# Patient Record
Sex: Female | Born: 1985 | Hispanic: No | Smoking: Current every day smoker
Health system: Southern US, Community
[De-identification: ages and names within clinical notes are randomized; demographics above are authoritative.]

## PROBLEM LIST (undated history)

## (undated) DIAGNOSIS — N390 Urinary tract infection, site not specified: Secondary | ICD-10-CM

## (undated) DIAGNOSIS — E119 Type 2 diabetes mellitus without complications: Secondary | ICD-10-CM

## (undated) DIAGNOSIS — B182 Chronic viral hepatitis C: Secondary | ICD-10-CM

## (undated) DIAGNOSIS — O139 Gestational [pregnancy-induced] hypertension without significant proteinuria, unspecified trimester: Secondary | ICD-10-CM

## (undated) DIAGNOSIS — F329 Major depressive disorder, single episode, unspecified: Secondary | ICD-10-CM

## (undated) DIAGNOSIS — N200 Calculus of kidney: Secondary | ICD-10-CM

## (undated) DIAGNOSIS — F32A Depression, unspecified: Secondary | ICD-10-CM

## (undated) DIAGNOSIS — F1910 Other psychoactive substance abuse, uncomplicated: Secondary | ICD-10-CM

## (undated) DIAGNOSIS — F4310 Post-traumatic stress disorder, unspecified: Secondary | ICD-10-CM

## (undated) DIAGNOSIS — E669 Obesity, unspecified: Secondary | ICD-10-CM

## (undated) DIAGNOSIS — F319 Bipolar disorder, unspecified: Secondary | ICD-10-CM

## (undated) DIAGNOSIS — F1410 Cocaine abuse, uncomplicated: Secondary | ICD-10-CM

## (undated) HISTORY — PX: OTHER SURGICAL HISTORY: SHX169

## (undated) HISTORY — DX: Cocaine abuse, uncomplicated: F14.10

---
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
## 2003-01-03 ENCOUNTER — Inpatient Hospital Stay (HOSPITAL_COMMUNITY): Admission: AD | Admit: 2003-01-03 | Discharge: 2003-01-03 | Payer: Self-pay | Admitting: Obstetrics & Gynecology

## 2003-01-08 ENCOUNTER — Ambulatory Visit (HOSPITAL_COMMUNITY): Admission: RE | Admit: 2003-01-08 | Discharge: 2003-01-08 | Payer: Self-pay | Admitting: Obstetrics & Gynecology

## 2003-01-08 ENCOUNTER — Encounter: Payer: Self-pay | Admitting: Obstetrics & Gynecology

## 2003-01-14 ENCOUNTER — Encounter: Payer: Self-pay | Admitting: Obstetrics & Gynecology

## 2003-01-14 ENCOUNTER — Inpatient Hospital Stay (HOSPITAL_COMMUNITY): Admission: AD | Admit: 2003-01-14 | Discharge: 2003-01-14 | Payer: Self-pay | Admitting: Obstetrics & Gynecology

## 2003-01-19 ENCOUNTER — Inpatient Hospital Stay (HOSPITAL_COMMUNITY): Admission: AD | Admit: 2003-01-19 | Discharge: 2003-01-19 | Payer: Self-pay | Admitting: Obstetrics & Gynecology

## 2003-01-24 ENCOUNTER — Inpatient Hospital Stay (HOSPITAL_COMMUNITY): Admission: AD | Admit: 2003-01-24 | Discharge: 2003-01-24 | Payer: Self-pay | Admitting: Obstetrics

## 2003-02-08 ENCOUNTER — Inpatient Hospital Stay (HOSPITAL_COMMUNITY): Admission: AD | Admit: 2003-02-08 | Discharge: 2003-02-08 | Payer: Self-pay | Admitting: Obstetrics

## 2003-03-14 ENCOUNTER — Inpatient Hospital Stay (HOSPITAL_COMMUNITY): Admission: AD | Admit: 2003-03-14 | Discharge: 2003-03-14 | Payer: Self-pay | Admitting: Obstetrics

## 2003-03-18 ENCOUNTER — Inpatient Hospital Stay (HOSPITAL_COMMUNITY): Admission: AD | Admit: 2003-03-18 | Discharge: 2003-03-18 | Payer: Self-pay | Admitting: Obstetrics

## 2003-03-20 ENCOUNTER — Inpatient Hospital Stay (HOSPITAL_COMMUNITY): Admission: AD | Admit: 2003-03-20 | Discharge: 2003-03-20 | Payer: Self-pay | Admitting: Obstetrics & Gynecology

## 2003-04-03 ENCOUNTER — Inpatient Hospital Stay (HOSPITAL_COMMUNITY): Admission: AD | Admit: 2003-04-03 | Discharge: 2003-04-03 | Payer: Self-pay | Admitting: Obstetrics

## 2003-04-08 ENCOUNTER — Inpatient Hospital Stay (HOSPITAL_COMMUNITY): Admission: AD | Admit: 2003-04-08 | Discharge: 2003-04-08 | Payer: Self-pay | Admitting: Obstetrics

## 2003-04-09 ENCOUNTER — Ambulatory Visit (HOSPITAL_COMMUNITY): Admission: RE | Admit: 2003-04-09 | Discharge: 2003-04-09 | Payer: Self-pay | Admitting: Obstetrics & Gynecology

## 2003-04-16 ENCOUNTER — Ambulatory Visit (HOSPITAL_COMMUNITY): Admission: RE | Admit: 2003-04-16 | Discharge: 2003-04-16 | Payer: Self-pay | Admitting: Obstetrics & Gynecology

## 2003-04-18 ENCOUNTER — Inpatient Hospital Stay (HOSPITAL_COMMUNITY): Admission: AD | Admit: 2003-04-18 | Discharge: 2003-04-22 | Payer: Self-pay | Admitting: Obstetrics

## 2003-04-22 ENCOUNTER — Inpatient Hospital Stay (HOSPITAL_COMMUNITY): Admission: AD | Admit: 2003-04-22 | Discharge: 2003-04-22 | Payer: Self-pay | Admitting: Obstetrics

## 2003-04-22 ENCOUNTER — Inpatient Hospital Stay (HOSPITAL_COMMUNITY): Admission: AD | Admit: 2003-04-22 | Discharge: 2003-04-26 | Payer: Self-pay | Admitting: Obstetrics & Gynecology

## 2003-05-02 ENCOUNTER — Inpatient Hospital Stay (HOSPITAL_COMMUNITY): Admission: AD | Admit: 2003-05-02 | Discharge: 2003-05-02 | Payer: Self-pay | Admitting: Obstetrics

## 2003-05-05 ENCOUNTER — Inpatient Hospital Stay (HOSPITAL_COMMUNITY): Admission: AD | Admit: 2003-05-05 | Discharge: 2003-05-05 | Payer: Self-pay | Admitting: Obstetrics & Gynecology

## 2003-05-07 ENCOUNTER — Ambulatory Visit (HOSPITAL_COMMUNITY): Admission: RE | Admit: 2003-05-07 | Discharge: 2003-05-07 | Payer: Self-pay | Admitting: Obstetrics & Gynecology

## 2003-05-15 ENCOUNTER — Inpatient Hospital Stay (HOSPITAL_COMMUNITY): Admission: AD | Admit: 2003-05-15 | Discharge: 2003-05-15 | Payer: Self-pay | Admitting: Obstetrics

## 2003-05-15 ENCOUNTER — Emergency Department (HOSPITAL_COMMUNITY): Admission: EM | Admit: 2003-05-15 | Discharge: 2003-05-15 | Payer: Self-pay | Admitting: Emergency Medicine

## 2003-05-15 ENCOUNTER — Inpatient Hospital Stay (HOSPITAL_COMMUNITY): Admission: AD | Admit: 2003-05-15 | Discharge: 2003-05-17 | Payer: Self-pay | Admitting: Obstetrics

## 2003-05-25 ENCOUNTER — Inpatient Hospital Stay (HOSPITAL_COMMUNITY): Admission: AD | Admit: 2003-05-25 | Discharge: 2003-05-28 | Payer: Self-pay | Admitting: Obstetrics & Gynecology

## 2003-05-28 ENCOUNTER — Inpatient Hospital Stay (HOSPITAL_COMMUNITY): Admission: AD | Admit: 2003-05-28 | Discharge: 2003-05-28 | Payer: Self-pay | Admitting: Obstetrics

## 2003-05-29 ENCOUNTER — Inpatient Hospital Stay (HOSPITAL_COMMUNITY): Admission: AD | Admit: 2003-05-29 | Discharge: 2003-05-29 | Payer: Self-pay | Admitting: Obstetrics

## 2003-06-07 ENCOUNTER — Inpatient Hospital Stay (HOSPITAL_COMMUNITY): Admission: RE | Admit: 2003-06-07 | Discharge: 2003-06-07 | Payer: Self-pay | Admitting: Obstetrics & Gynecology

## 2003-06-13 ENCOUNTER — Inpatient Hospital Stay (HOSPITAL_COMMUNITY): Admission: AD | Admit: 2003-06-13 | Discharge: 2003-06-14 | Payer: Self-pay | Admitting: Obstetrics

## 2003-06-19 ENCOUNTER — Inpatient Hospital Stay (HOSPITAL_COMMUNITY): Admission: AD | Admit: 2003-06-19 | Discharge: 2003-06-19 | Payer: Self-pay | Admitting: Obstetrics & Gynecology

## 2003-06-20 ENCOUNTER — Inpatient Hospital Stay (HOSPITAL_COMMUNITY): Admission: AD | Admit: 2003-06-20 | Discharge: 2003-06-20 | Payer: Self-pay | Admitting: Obstetrics & Gynecology

## 2003-06-24 ENCOUNTER — Ambulatory Visit (HOSPITAL_COMMUNITY): Admission: RE | Admit: 2003-06-24 | Discharge: 2003-06-24 | Payer: Self-pay | Admitting: Obstetrics & Gynecology

## 2003-06-25 ENCOUNTER — Inpatient Hospital Stay (HOSPITAL_COMMUNITY): Admission: AD | Admit: 2003-06-25 | Discharge: 2003-06-25 | Payer: Self-pay | Admitting: Obstetrics

## 2003-06-30 ENCOUNTER — Inpatient Hospital Stay (HOSPITAL_COMMUNITY): Admission: AD | Admit: 2003-06-30 | Discharge: 2003-06-30 | Payer: Self-pay | Admitting: Obstetrics

## 2003-07-01 ENCOUNTER — Inpatient Hospital Stay (HOSPITAL_COMMUNITY): Admission: AD | Admit: 2003-07-01 | Discharge: 2003-07-01 | Payer: Self-pay | Admitting: Obstetrics & Gynecology

## 2003-07-06 ENCOUNTER — Ambulatory Visit (HOSPITAL_COMMUNITY): Admission: RE | Admit: 2003-07-06 | Discharge: 2003-07-06 | Payer: Self-pay | Admitting: Obstetrics & Gynecology

## 2003-07-07 ENCOUNTER — Inpatient Hospital Stay (HOSPITAL_COMMUNITY): Admission: AD | Admit: 2003-07-07 | Discharge: 2003-07-09 | Payer: Self-pay | Admitting: Obstetrics

## 2003-07-14 ENCOUNTER — Inpatient Hospital Stay (HOSPITAL_COMMUNITY): Admission: AD | Admit: 2003-07-14 | Discharge: 2003-07-14 | Payer: Self-pay | Admitting: Obstetrics

## 2003-07-16 ENCOUNTER — Ambulatory Visit (HOSPITAL_COMMUNITY): Admission: RE | Admit: 2003-07-16 | Discharge: 2003-07-16 | Payer: Self-pay | Admitting: Obstetrics & Gynecology

## 2003-07-23 ENCOUNTER — Inpatient Hospital Stay (HOSPITAL_COMMUNITY): Admission: AD | Admit: 2003-07-23 | Discharge: 2003-08-08 | Payer: Self-pay | Admitting: Obstetrics

## 2003-08-09 ENCOUNTER — Inpatient Hospital Stay (HOSPITAL_COMMUNITY): Admission: AD | Admit: 2003-08-09 | Discharge: 2003-08-09 | Payer: Self-pay | Admitting: Obstetrics & Gynecology

## 2003-08-10 ENCOUNTER — Inpatient Hospital Stay (HOSPITAL_COMMUNITY): Admission: AD | Admit: 2003-08-10 | Discharge: 2003-08-10 | Payer: Self-pay | Admitting: Obstetrics

## 2003-08-10 ENCOUNTER — Inpatient Hospital Stay (HOSPITAL_COMMUNITY): Admission: AD | Admit: 2003-08-10 | Discharge: 2003-08-11 | Payer: Self-pay | Admitting: Obstetrics

## 2003-08-12 ENCOUNTER — Inpatient Hospital Stay (HOSPITAL_COMMUNITY): Admission: AD | Admit: 2003-08-12 | Discharge: 2003-08-26 | Payer: Self-pay | Admitting: Obstetrics

## 2003-08-24 ENCOUNTER — Encounter (INDEPENDENT_AMBULATORY_CARE_PROVIDER_SITE_OTHER): Payer: Self-pay | Admitting: Specialist

## 2003-11-01 ENCOUNTER — Emergency Department (HOSPITAL_COMMUNITY): Admission: EM | Admit: 2003-11-01 | Discharge: 2003-11-02 | Payer: Self-pay | Admitting: Emergency Medicine

## 2003-12-01 ENCOUNTER — Emergency Department (HOSPITAL_COMMUNITY): Admission: EM | Admit: 2003-12-01 | Discharge: 2003-12-01 | Payer: Self-pay

## 2003-12-13 ENCOUNTER — Emergency Department (HOSPITAL_COMMUNITY): Admission: EM | Admit: 2003-12-13 | Discharge: 2003-12-13 | Payer: Self-pay | Admitting: Emergency Medicine

## 2003-12-13 ENCOUNTER — Inpatient Hospital Stay (HOSPITAL_COMMUNITY): Admission: AD | Admit: 2003-12-13 | Discharge: 2003-12-13 | Payer: Self-pay | Admitting: Obstetrics & Gynecology

## 2003-12-22 ENCOUNTER — Emergency Department (HOSPITAL_COMMUNITY): Admission: EM | Admit: 2003-12-22 | Discharge: 2003-12-22 | Payer: Self-pay | Admitting: Emergency Medicine

## 2003-12-30 ENCOUNTER — Ambulatory Visit (HOSPITAL_COMMUNITY): Admission: RE | Admit: 2003-12-30 | Discharge: 2003-12-30 | Payer: Self-pay | Admitting: Obstetrics & Gynecology

## 2003-12-31 ENCOUNTER — Emergency Department (HOSPITAL_COMMUNITY): Admission: EM | Admit: 2003-12-31 | Discharge: 2003-12-31 | Payer: Self-pay | Admitting: Emergency Medicine

## 2004-01-06 ENCOUNTER — Emergency Department (HOSPITAL_COMMUNITY): Admission: EM | Admit: 2004-01-06 | Discharge: 2004-01-06 | Payer: Self-pay | Admitting: Emergency Medicine

## 2004-02-28 ENCOUNTER — Emergency Department (HOSPITAL_COMMUNITY): Admission: EM | Admit: 2004-02-28 | Discharge: 2004-02-29 | Payer: Self-pay | Admitting: Emergency Medicine

## 2004-02-29 ENCOUNTER — Emergency Department (HOSPITAL_COMMUNITY): Admission: EM | Admit: 2004-02-29 | Discharge: 2004-03-01 | Payer: Self-pay | Admitting: Emergency Medicine

## 2004-03-07 ENCOUNTER — Emergency Department (HOSPITAL_COMMUNITY): Admission: EM | Admit: 2004-03-07 | Discharge: 2004-03-07 | Payer: Self-pay | Admitting: *Deleted

## 2004-04-29 ENCOUNTER — Emergency Department (HOSPITAL_COMMUNITY): Admission: EM | Admit: 2004-04-29 | Discharge: 2004-04-29 | Payer: Self-pay | Admitting: Emergency Medicine

## 2004-05-29 ENCOUNTER — Encounter: Admission: RE | Admit: 2004-05-29 | Discharge: 2004-06-22 | Payer: Self-pay | Admitting: Sports Medicine

## 2004-06-21 ENCOUNTER — Emergency Department (HOSPITAL_COMMUNITY): Admission: EM | Admit: 2004-06-21 | Discharge: 2004-06-21 | Payer: Self-pay | Admitting: Emergency Medicine

## 2004-06-22 ENCOUNTER — Emergency Department (HOSPITAL_COMMUNITY): Admission: EM | Admit: 2004-06-22 | Discharge: 2004-06-22 | Payer: Self-pay | Admitting: Emergency Medicine

## 2004-07-26 ENCOUNTER — Emergency Department (HOSPITAL_COMMUNITY): Admission: EM | Admit: 2004-07-26 | Discharge: 2004-07-26 | Payer: Self-pay | Admitting: Emergency Medicine

## 2004-07-28 ENCOUNTER — Ambulatory Visit (HOSPITAL_COMMUNITY): Admission: RE | Admit: 2004-07-28 | Discharge: 2004-07-28 | Payer: Self-pay | Admitting: Obstetrics & Gynecology

## 2004-08-03 ENCOUNTER — Emergency Department (HOSPITAL_COMMUNITY): Admission: EM | Admit: 2004-08-03 | Discharge: 2004-08-03 | Payer: Self-pay | Admitting: *Deleted

## 2004-08-08 ENCOUNTER — Inpatient Hospital Stay (HOSPITAL_COMMUNITY): Admission: AD | Admit: 2004-08-08 | Discharge: 2004-08-08 | Payer: Self-pay | Admitting: Obstetrics

## 2004-10-20 ENCOUNTER — Inpatient Hospital Stay (HOSPITAL_COMMUNITY): Admission: AD | Admit: 2004-10-20 | Discharge: 2004-10-20 | Payer: Self-pay | Admitting: Obstetrics & Gynecology

## 2004-10-27 ENCOUNTER — Emergency Department (HOSPITAL_COMMUNITY): Admission: EM | Admit: 2004-10-27 | Discharge: 2004-10-27 | Payer: Self-pay | Admitting: Emergency Medicine

## 2004-11-03 ENCOUNTER — Emergency Department (HOSPITAL_COMMUNITY): Admission: EM | Admit: 2004-11-03 | Discharge: 2004-11-03 | Payer: Self-pay | Admitting: Emergency Medicine

## 2004-12-26 ENCOUNTER — Inpatient Hospital Stay (HOSPITAL_COMMUNITY): Admission: AD | Admit: 2004-12-26 | Discharge: 2004-12-26 | Payer: Self-pay | Admitting: Obstetrics

## 2005-01-01 ENCOUNTER — Emergency Department (HOSPITAL_COMMUNITY): Admission: EM | Admit: 2005-01-01 | Discharge: 2005-01-01 | Payer: Self-pay | Admitting: Family Medicine

## 2005-01-01 ENCOUNTER — Emergency Department (HOSPITAL_COMMUNITY): Admission: EM | Admit: 2005-01-01 | Discharge: 2005-01-01 | Payer: Self-pay | Admitting: Emergency Medicine

## 2005-01-17 ENCOUNTER — Inpatient Hospital Stay (HOSPITAL_COMMUNITY): Admission: AD | Admit: 2005-01-17 | Discharge: 2005-01-17 | Payer: Self-pay | Admitting: Obstetrics & Gynecology

## 2005-03-20 ENCOUNTER — Inpatient Hospital Stay (HOSPITAL_COMMUNITY): Admission: AD | Admit: 2005-03-20 | Discharge: 2005-03-20 | Payer: Self-pay | Admitting: Obstetrics & Gynecology

## 2005-08-07 ENCOUNTER — Emergency Department (HOSPITAL_COMMUNITY): Admission: EM | Admit: 2005-08-07 | Discharge: 2005-08-08 | Payer: Self-pay | Admitting: Emergency Medicine

## 2005-10-25 ENCOUNTER — Inpatient Hospital Stay (HOSPITAL_COMMUNITY): Admission: AD | Admit: 2005-10-25 | Discharge: 2005-10-25 | Payer: Self-pay | Admitting: Obstetrics & Gynecology

## 2005-10-28 ENCOUNTER — Emergency Department (HOSPITAL_COMMUNITY): Admission: EM | Admit: 2005-10-28 | Discharge: 2005-10-28 | Payer: Self-pay | Admitting: Emergency Medicine

## 2005-11-16 ENCOUNTER — Emergency Department (HOSPITAL_COMMUNITY): Admission: EM | Admit: 2005-11-16 | Discharge: 2005-11-16 | Payer: Self-pay | Admitting: Emergency Medicine

## 2005-11-17 ENCOUNTER — Emergency Department (HOSPITAL_COMMUNITY): Admission: EM | Admit: 2005-11-17 | Discharge: 2005-11-18 | Payer: Self-pay | Admitting: Emergency Medicine

## 2005-12-10 ENCOUNTER — Emergency Department (HOSPITAL_COMMUNITY): Admission: EM | Admit: 2005-12-10 | Discharge: 2005-12-10 | Payer: Self-pay | Admitting: Family Medicine

## 2006-01-25 ENCOUNTER — Inpatient Hospital Stay (HOSPITAL_COMMUNITY): Admission: AD | Admit: 2006-01-25 | Discharge: 2006-01-25 | Payer: Self-pay | Admitting: Obstetrics

## 2006-05-07 ENCOUNTER — Emergency Department (HOSPITAL_COMMUNITY): Admission: EM | Admit: 2006-05-07 | Discharge: 2006-05-08 | Payer: Self-pay | Admitting: Emergency Medicine

## 2006-05-16 ENCOUNTER — Emergency Department (HOSPITAL_COMMUNITY): Admission: EM | Admit: 2006-05-16 | Discharge: 2006-05-16 | Payer: Self-pay | Admitting: Emergency Medicine

## 2006-06-16 ENCOUNTER — Emergency Department (HOSPITAL_COMMUNITY): Admission: EM | Admit: 2006-06-16 | Discharge: 2006-06-16 | Payer: Self-pay | Admitting: Emergency Medicine

## 2006-09-14 ENCOUNTER — Inpatient Hospital Stay (HOSPITAL_COMMUNITY): Admission: AD | Admit: 2006-09-14 | Discharge: 2006-09-14 | Payer: Self-pay | Admitting: Obstetrics

## 2006-09-22 ENCOUNTER — Emergency Department (HOSPITAL_COMMUNITY): Admission: EM | Admit: 2006-09-22 | Discharge: 2006-09-22 | Payer: Self-pay | Admitting: Emergency Medicine

## 2006-10-14 ENCOUNTER — Ambulatory Visit: Payer: Self-pay | Admitting: Psychiatry

## 2006-10-14 ENCOUNTER — Inpatient Hospital Stay (HOSPITAL_COMMUNITY): Admission: AD | Admit: 2006-10-14 | Discharge: 2006-10-22 | Payer: Self-pay | Admitting: Psychiatry

## 2006-10-27 ENCOUNTER — Encounter: Payer: Self-pay | Admitting: Emergency Medicine

## 2006-10-27 ENCOUNTER — Inpatient Hospital Stay (HOSPITAL_COMMUNITY): Admission: AD | Admit: 2006-10-27 | Discharge: 2006-11-04 | Payer: Self-pay | Admitting: Psychiatry

## 2006-11-25 ENCOUNTER — Emergency Department (HOSPITAL_COMMUNITY): Admission: EM | Admit: 2006-11-25 | Discharge: 2006-11-26 | Payer: Self-pay | Admitting: Emergency Medicine

## 2006-12-28 ENCOUNTER — Emergency Department (HOSPITAL_COMMUNITY): Admission: EM | Admit: 2006-12-28 | Discharge: 2006-12-29 | Payer: Self-pay | Admitting: Emergency Medicine

## 2006-12-29 ENCOUNTER — Inpatient Hospital Stay (HOSPITAL_COMMUNITY): Admission: EM | Admit: 2006-12-29 | Discharge: 2006-12-31 | Payer: Self-pay | Admitting: Emergency Medicine

## 2006-12-31 ENCOUNTER — Inpatient Hospital Stay (HOSPITAL_COMMUNITY): Admission: AD | Admit: 2006-12-31 | Discharge: 2007-01-02 | Payer: Self-pay | Admitting: *Deleted

## 2006-12-31 ENCOUNTER — Ambulatory Visit: Payer: Self-pay | Admitting: *Deleted

## 2007-01-08 ENCOUNTER — Emergency Department (HOSPITAL_COMMUNITY): Admission: EM | Admit: 2007-01-08 | Discharge: 2007-01-08 | Payer: Self-pay | Admitting: Emergency Medicine

## 2007-01-20 ENCOUNTER — Emergency Department (HOSPITAL_COMMUNITY): Admission: EM | Admit: 2007-01-20 | Discharge: 2007-01-20 | Payer: Self-pay | Admitting: Emergency Medicine

## 2007-01-25 ENCOUNTER — Emergency Department (HOSPITAL_COMMUNITY): Admission: EM | Admit: 2007-01-25 | Discharge: 2007-01-26 | Payer: Self-pay | Admitting: Emergency Medicine

## 2007-02-02 ENCOUNTER — Emergency Department (HOSPITAL_COMMUNITY): Admission: EM | Admit: 2007-02-02 | Discharge: 2007-02-02 | Payer: Self-pay | Admitting: *Deleted

## 2007-02-06 ENCOUNTER — Inpatient Hospital Stay (HOSPITAL_COMMUNITY): Admission: AD | Admit: 2007-02-06 | Discharge: 2007-02-07 | Payer: Self-pay | Admitting: Obstetrics

## 2007-02-09 ENCOUNTER — Inpatient Hospital Stay (HOSPITAL_COMMUNITY): Admission: AD | Admit: 2007-02-09 | Discharge: 2007-02-09 | Payer: Self-pay | Admitting: Obstetrics & Gynecology

## 2007-02-28 ENCOUNTER — Inpatient Hospital Stay (HOSPITAL_COMMUNITY): Admission: AD | Admit: 2007-02-28 | Discharge: 2007-03-04 | Payer: Self-pay | Admitting: Obstetrics

## 2007-04-09 ENCOUNTER — Inpatient Hospital Stay (HOSPITAL_COMMUNITY): Admission: AD | Admit: 2007-04-09 | Discharge: 2007-04-09 | Payer: Self-pay | Admitting: Obstetrics

## 2007-04-12 ENCOUNTER — Inpatient Hospital Stay (HOSPITAL_COMMUNITY): Admission: AD | Admit: 2007-04-12 | Discharge: 2007-04-14 | Payer: Self-pay | Admitting: Obstetrics & Gynecology

## 2007-04-24 ENCOUNTER — Observation Stay (HOSPITAL_COMMUNITY): Admission: AD | Admit: 2007-04-24 | Discharge: 2007-04-25 | Payer: Self-pay | Admitting: Obstetrics

## 2007-05-10 ENCOUNTER — Inpatient Hospital Stay (HOSPITAL_COMMUNITY): Admission: AD | Admit: 2007-05-10 | Discharge: 2007-05-16 | Payer: Self-pay | Admitting: Obstetrics

## 2007-05-17 ENCOUNTER — Inpatient Hospital Stay (HOSPITAL_COMMUNITY): Admission: AD | Admit: 2007-05-17 | Discharge: 2007-05-17 | Payer: Self-pay | Admitting: Obstetrics & Gynecology

## 2007-05-22 ENCOUNTER — Inpatient Hospital Stay (HOSPITAL_COMMUNITY): Admission: AD | Admit: 2007-05-22 | Discharge: 2007-05-23 | Payer: Self-pay | Admitting: Obstetrics

## 2007-05-26 ENCOUNTER — Inpatient Hospital Stay (HOSPITAL_COMMUNITY): Admission: AD | Admit: 2007-05-26 | Discharge: 2007-05-26 | Payer: Self-pay | Admitting: Obstetrics & Gynecology

## 2007-06-13 ENCOUNTER — Inpatient Hospital Stay (HOSPITAL_COMMUNITY): Admission: AD | Admit: 2007-06-13 | Discharge: 2007-06-13 | Payer: Self-pay | Admitting: Obstetrics & Gynecology

## 2007-07-06 ENCOUNTER — Inpatient Hospital Stay (HOSPITAL_COMMUNITY): Admission: AD | Admit: 2007-07-06 | Discharge: 2007-07-11 | Payer: Self-pay | Admitting: Obstetrics & Gynecology

## 2007-07-12 ENCOUNTER — Inpatient Hospital Stay (HOSPITAL_COMMUNITY): Admission: AD | Admit: 2007-07-12 | Discharge: 2007-07-12 | Payer: Self-pay | Admitting: Obstetrics

## 2007-07-24 ENCOUNTER — Inpatient Hospital Stay (HOSPITAL_COMMUNITY): Admission: AD | Admit: 2007-07-24 | Discharge: 2007-07-25 | Payer: Self-pay | Admitting: Obstetrics & Gynecology

## 2007-08-07 ENCOUNTER — Ambulatory Visit (HOSPITAL_COMMUNITY): Admission: RE | Admit: 2007-08-07 | Discharge: 2007-08-07 | Payer: Self-pay | Admitting: Obstetrics & Gynecology

## 2007-08-09 ENCOUNTER — Inpatient Hospital Stay (HOSPITAL_COMMUNITY): Admission: AD | Admit: 2007-08-09 | Discharge: 2007-08-09 | Payer: Self-pay | Admitting: Obstetrics

## 2007-08-14 ENCOUNTER — Ambulatory Visit (HOSPITAL_COMMUNITY): Admission: RE | Admit: 2007-08-14 | Discharge: 2007-08-14 | Payer: Self-pay | Admitting: Obstetrics

## 2007-08-18 ENCOUNTER — Inpatient Hospital Stay (HOSPITAL_COMMUNITY): Admission: AD | Admit: 2007-08-18 | Discharge: 2007-08-18 | Payer: Self-pay | Admitting: Obstetrics & Gynecology

## 2007-08-19 ENCOUNTER — Inpatient Hospital Stay (HOSPITAL_COMMUNITY): Admission: AD | Admit: 2007-08-19 | Discharge: 2007-08-19 | Payer: Self-pay | Admitting: Obstetrics & Gynecology

## 2007-08-25 ENCOUNTER — Ambulatory Visit (HOSPITAL_COMMUNITY): Admission: AD | Admit: 2007-08-25 | Discharge: 2007-08-25 | Payer: Self-pay | Admitting: Obstetrics

## 2007-09-01 ENCOUNTER — Inpatient Hospital Stay (HOSPITAL_COMMUNITY): Admission: AD | Admit: 2007-09-01 | Discharge: 2007-09-05 | Payer: Self-pay | Admitting: Obstetrics & Gynecology

## 2007-09-17 ENCOUNTER — Emergency Department (HOSPITAL_COMMUNITY): Admission: EM | Admit: 2007-09-17 | Discharge: 2007-09-17 | Payer: Self-pay | Admitting: Emergency Medicine

## 2007-10-27 ENCOUNTER — Emergency Department (HOSPITAL_COMMUNITY): Admission: EM | Admit: 2007-10-27 | Discharge: 2007-10-27 | Payer: Self-pay | Admitting: Emergency Medicine

## 2008-02-01 ENCOUNTER — Inpatient Hospital Stay (HOSPITAL_COMMUNITY): Admission: AD | Admit: 2008-02-01 | Discharge: 2008-02-01 | Payer: Self-pay | Admitting: Obstetrics & Gynecology

## 2008-05-06 ENCOUNTER — Emergency Department (HOSPITAL_COMMUNITY): Admission: EM | Admit: 2008-05-06 | Discharge: 2008-05-06 | Payer: Self-pay | Admitting: Emergency Medicine

## 2008-05-23 ENCOUNTER — Emergency Department (HOSPITAL_COMMUNITY): Admission: EM | Admit: 2008-05-23 | Discharge: 2008-05-24 | Payer: Self-pay | Admitting: Emergency Medicine

## 2008-06-07 ENCOUNTER — Other Ambulatory Visit: Payer: Self-pay

## 2008-06-07 ENCOUNTER — Inpatient Hospital Stay (HOSPITAL_COMMUNITY): Admission: RE | Admit: 2008-06-07 | Discharge: 2008-06-09 | Payer: Self-pay | Admitting: *Deleted

## 2008-06-07 ENCOUNTER — Ambulatory Visit: Payer: Self-pay | Admitting: *Deleted

## 2008-07-31 ENCOUNTER — Emergency Department (HOSPITAL_COMMUNITY): Admission: EM | Admit: 2008-07-31 | Discharge: 2008-07-31 | Payer: Self-pay | Admitting: Emergency Medicine

## 2008-09-07 ENCOUNTER — Ambulatory Visit (HOSPITAL_COMMUNITY): Admission: RE | Admit: 2008-09-07 | Discharge: 2008-09-07 | Payer: Self-pay | Admitting: Obstetrics & Gynecology

## 2008-10-14 ENCOUNTER — Inpatient Hospital Stay (HOSPITAL_COMMUNITY): Admission: AD | Admit: 2008-10-14 | Discharge: 2008-10-16 | Payer: Self-pay | Admitting: Psychiatry

## 2008-10-14 ENCOUNTER — Ambulatory Visit: Payer: Self-pay | Admitting: Psychiatry

## 2008-10-14 ENCOUNTER — Encounter: Payer: Self-pay | Admitting: Emergency Medicine

## 2008-10-19 ENCOUNTER — Emergency Department (HOSPITAL_COMMUNITY): Admission: EM | Admit: 2008-10-19 | Discharge: 2008-10-19 | Payer: Self-pay | Admitting: Emergency Medicine

## 2008-10-27 ENCOUNTER — Inpatient Hospital Stay (HOSPITAL_COMMUNITY): Admission: AD | Admit: 2008-10-27 | Discharge: 2008-10-27 | Payer: Self-pay | Admitting: Obstetrics

## 2008-10-28 ENCOUNTER — Ambulatory Visit (HOSPITAL_COMMUNITY): Admission: RE | Admit: 2008-10-28 | Discharge: 2008-10-28 | Payer: Self-pay | Admitting: Obstetrics & Gynecology

## 2008-11-10 ENCOUNTER — Emergency Department (HOSPITAL_COMMUNITY): Admission: EM | Admit: 2008-11-10 | Discharge: 2008-11-10 | Payer: Self-pay | Admitting: Emergency Medicine

## 2008-11-23 ENCOUNTER — Inpatient Hospital Stay (HOSPITAL_COMMUNITY): Admission: AD | Admit: 2008-11-23 | Discharge: 2008-11-26 | Payer: Self-pay | Admitting: Psychiatry

## 2008-11-23 ENCOUNTER — Ambulatory Visit: Payer: Self-pay | Admitting: Psychiatry

## 2008-11-28 ENCOUNTER — Emergency Department (HOSPITAL_COMMUNITY): Admission: EM | Admit: 2008-11-28 | Discharge: 2008-11-28 | Payer: Self-pay | Admitting: Emergency Medicine

## 2008-12-03 ENCOUNTER — Other Ambulatory Visit: Payer: Self-pay | Admitting: Emergency Medicine

## 2008-12-04 ENCOUNTER — Encounter (INDEPENDENT_AMBULATORY_CARE_PROVIDER_SITE_OTHER): Payer: Self-pay | Admitting: Emergency Medicine

## 2008-12-04 ENCOUNTER — Inpatient Hospital Stay (HOSPITAL_COMMUNITY): Admission: AD | Admit: 2008-12-04 | Discharge: 2008-12-05 | Payer: Self-pay | Admitting: Psychiatry

## 2008-12-04 ENCOUNTER — Ambulatory Visit: Payer: Self-pay | Admitting: *Deleted

## 2008-12-25 ENCOUNTER — Other Ambulatory Visit: Payer: Self-pay | Admitting: Emergency Medicine

## 2008-12-26 ENCOUNTER — Inpatient Hospital Stay (HOSPITAL_COMMUNITY): Admission: AD | Admit: 2008-12-26 | Discharge: 2008-12-30 | Payer: Self-pay | Admitting: *Deleted

## 2008-12-26 ENCOUNTER — Ambulatory Visit: Payer: Self-pay | Admitting: *Deleted

## 2008-12-26 ENCOUNTER — Ambulatory Visit: Payer: Self-pay | Admitting: Psychiatry

## 2008-12-28 ENCOUNTER — Encounter: Payer: Self-pay | Admitting: Emergency Medicine

## 2009-01-02 ENCOUNTER — Encounter: Payer: Self-pay | Admitting: Emergency Medicine

## 2009-01-03 ENCOUNTER — Inpatient Hospital Stay (HOSPITAL_COMMUNITY): Admission: RE | Admit: 2009-01-03 | Discharge: 2009-01-04 | Payer: Self-pay | Admitting: Psychiatry

## 2009-01-07 ENCOUNTER — Inpatient Hospital Stay (HOSPITAL_COMMUNITY): Admission: AD | Admit: 2009-01-07 | Discharge: 2009-01-07 | Payer: Self-pay | Admitting: Obstetrics

## 2009-01-14 ENCOUNTER — Other Ambulatory Visit: Payer: Self-pay | Admitting: Emergency Medicine

## 2009-01-15 ENCOUNTER — Inpatient Hospital Stay (HOSPITAL_COMMUNITY): Admission: EM | Admit: 2009-01-15 | Discharge: 2009-01-16 | Payer: Self-pay | Admitting: *Deleted

## 2009-01-30 ENCOUNTER — Emergency Department (HOSPITAL_COMMUNITY): Admission: EM | Admit: 2009-01-30 | Discharge: 2009-01-30 | Payer: Self-pay | Admitting: Emergency Medicine

## 2009-02-21 ENCOUNTER — Emergency Department (HOSPITAL_COMMUNITY): Admission: EM | Admit: 2009-02-21 | Discharge: 2009-02-21 | Payer: Self-pay | Admitting: Emergency Medicine

## 2009-03-15 ENCOUNTER — Emergency Department (HOSPITAL_COMMUNITY): Admission: EM | Admit: 2009-03-15 | Discharge: 2009-03-15 | Payer: Self-pay | Admitting: Emergency Medicine

## 2009-03-21 ENCOUNTER — Inpatient Hospital Stay (HOSPITAL_COMMUNITY): Admission: AD | Admit: 2009-03-21 | Discharge: 2009-03-21 | Payer: Self-pay | Admitting: Obstetrics

## 2009-03-26 ENCOUNTER — Emergency Department (HOSPITAL_COMMUNITY): Admission: EM | Admit: 2009-03-26 | Discharge: 2009-03-26 | Payer: Self-pay | Admitting: Emergency Medicine

## 2009-03-29 ENCOUNTER — Other Ambulatory Visit: Payer: Self-pay

## 2009-03-29 ENCOUNTER — Other Ambulatory Visit: Payer: Self-pay | Admitting: Emergency Medicine

## 2009-03-30 ENCOUNTER — Inpatient Hospital Stay (HOSPITAL_COMMUNITY): Admission: AD | Admit: 2009-03-30 | Discharge: 2009-03-31 | Payer: Self-pay | Admitting: Psychiatry

## 2009-03-30 ENCOUNTER — Ambulatory Visit: Payer: Self-pay | Admitting: Psychiatry

## 2009-05-02 ENCOUNTER — Emergency Department (HOSPITAL_COMMUNITY): Admission: EM | Admit: 2009-05-02 | Discharge: 2009-05-02 | Payer: Self-pay | Admitting: Emergency Medicine

## 2009-05-16 ENCOUNTER — Inpatient Hospital Stay (HOSPITAL_COMMUNITY): Admission: AD | Admit: 2009-05-16 | Discharge: 2009-05-16 | Payer: Self-pay | Admitting: Obstetrics & Gynecology

## 2009-07-24 ENCOUNTER — Emergency Department (HOSPITAL_COMMUNITY): Admission: EM | Admit: 2009-07-24 | Discharge: 2009-07-24 | Payer: Self-pay | Admitting: Emergency Medicine

## 2009-07-25 ENCOUNTER — Inpatient Hospital Stay (HOSPITAL_COMMUNITY): Admission: AD | Admit: 2009-07-25 | Discharge: 2009-07-25 | Payer: Self-pay | Admitting: Obstetrics & Gynecology

## 2009-08-02 ENCOUNTER — Emergency Department (HOSPITAL_COMMUNITY): Admission: EM | Admit: 2009-08-02 | Discharge: 2009-08-02 | Payer: Self-pay | Admitting: Emergency Medicine

## 2009-10-18 ENCOUNTER — Emergency Department (HOSPITAL_COMMUNITY): Admission: EM | Admit: 2009-10-18 | Discharge: 2009-10-18 | Payer: Self-pay | Admitting: Emergency Medicine

## 2009-12-23 ENCOUNTER — Emergency Department (HOSPITAL_COMMUNITY): Admission: EM | Admit: 2009-12-23 | Discharge: 2009-12-23 | Payer: Self-pay | Admitting: Emergency Medicine

## 2009-12-24 ENCOUNTER — Emergency Department (HOSPITAL_COMMUNITY): Admission: EM | Admit: 2009-12-24 | Discharge: 2009-12-24 | Payer: Self-pay | Admitting: Emergency Medicine

## 2010-01-11 ENCOUNTER — Inpatient Hospital Stay (HOSPITAL_COMMUNITY): Admission: AD | Admit: 2010-01-11 | Discharge: 2010-01-11 | Payer: Self-pay | Admitting: Obstetrics & Gynecology

## 2010-02-27 ENCOUNTER — Emergency Department (HOSPITAL_COMMUNITY): Admission: EM | Admit: 2010-02-27 | Discharge: 2010-02-28 | Payer: Self-pay | Admitting: Emergency Medicine

## 2010-05-04 ENCOUNTER — Inpatient Hospital Stay (HOSPITAL_COMMUNITY): Admission: AD | Admit: 2010-05-04 | Discharge: 2010-05-04 | Payer: Self-pay | Admitting: Obstetrics

## 2010-06-20 ENCOUNTER — Inpatient Hospital Stay (HOSPITAL_COMMUNITY)
Admission: AD | Admit: 2010-06-20 | Discharge: 2010-06-20 | Payer: Self-pay | Source: Home / Self Care | Attending: Obstetrics | Admitting: Obstetrics

## 2010-06-26 LAB — WET PREP, GENITAL: Yeast Wet Prep HPF POC: NONE SEEN

## 2010-06-26 LAB — URINALYSIS, ROUTINE W REFLEX MICROSCOPIC
Bilirubin Urine: NEGATIVE
Hgb urine dipstick: NEGATIVE
Ketones, ur: NEGATIVE mg/dL
Nitrite: POSITIVE — AB
Protein, ur: NEGATIVE mg/dL
Specific Gravity, Urine: 1.025 (ref 1.005–1.030)
Urine Glucose, Fasting: NEGATIVE mg/dL
Urobilinogen, UA: 0.2 mg/dL (ref 0.0–1.0)
pH: 6 (ref 5.0–8.0)

## 2010-06-26 LAB — DIFFERENTIAL
Basophils Absolute: 0 10*3/uL (ref 0.0–0.1)
Basophils Relative: 0 % (ref 0–1)
Eosinophils Absolute: 0.1 10*3/uL (ref 0.0–0.7)
Eosinophils Relative: 1 % (ref 0–5)
Lymphocytes Relative: 26 % (ref 12–46)
Lymphs Abs: 2.6 10*3/uL (ref 0.7–4.0)
Monocytes Absolute: 0.6 10*3/uL (ref 0.1–1.0)
Monocytes Relative: 6 % (ref 3–12)
Neutro Abs: 6.7 10*3/uL (ref 1.7–7.7)
Neutrophils Relative %: 67 % (ref 43–77)

## 2010-06-26 LAB — COMPREHENSIVE METABOLIC PANEL
ALT: 18 U/L (ref 0–35)
AST: 13 U/L (ref 0–37)
Albumin: 3.3 g/dL — ABNORMAL LOW (ref 3.5–5.2)
Alkaline Phosphatase: 68 U/L (ref 39–117)
BUN: 8 mg/dL (ref 6–23)
CO2: 27 mEq/L (ref 19–32)
Calcium: 8.9 mg/dL (ref 8.4–10.5)
Chloride: 104 mEq/L (ref 96–112)
Creatinine, Ser: 0.75 mg/dL (ref 0.4–1.2)
GFR calc Af Amer: >60 mL/min (ref >60)
GFR calc non Af Amer: >60 mL/min (ref >60)
Glucose, Bld: 83 mg/dL (ref 70–99)
Potassium: 3.8 mEq/L (ref 3.5–5.1)
Sodium: 138 mEq/L (ref 135–145)
Total Bilirubin: 0.3 mg/dL (ref 0.3–1.2)
Total Protein: 7.5 g/dL (ref 6.0–8.3)

## 2010-06-26 LAB — CBC
HCT: 34.4 % — ABNORMAL LOW (ref 36.0–46.0)
Hemoglobin: 10.3 g/dL — ABNORMAL LOW (ref 12.0–15.0)
MCH: 22.1 pg — ABNORMAL LOW (ref 26.0–34.0)
MCHC: 29.9 g/dL — ABNORMAL LOW (ref 30.0–36.0)
MCV: 73.7 fL — ABNORMAL LOW (ref 78.0–100.0)
Platelets: 296 10*3/uL (ref 150–400)
RBC: 4.67 MIL/uL (ref 3.87–5.11)
RDW: 17.4 % — ABNORMAL HIGH (ref 11.5–15.5)
WBC: 9.9 10*3/uL (ref 4.0–10.5)

## 2010-06-26 LAB — GC/CHLAMYDIA PROBE AMP, GENITAL
Chlamydia, DNA Probe: POSITIVE — AB
GC Probe Amp, Genital: NEGATIVE

## 2010-06-26 LAB — POCT PREGNANCY, URINE: Preg Test, Ur: NEGATIVE

## 2010-06-26 LAB — URINE MICROSCOPIC-ADD ON

## 2010-07-01 ENCOUNTER — Encounter: Payer: Self-pay | Admitting: Obstetrics & Gynecology

## 2010-07-02 ENCOUNTER — Encounter: Payer: Self-pay | Admitting: Obstetrics & Gynecology

## 2010-07-02 ENCOUNTER — Encounter: Payer: Self-pay | Admitting: Obstetrics

## 2010-07-03 ENCOUNTER — Encounter: Payer: Self-pay | Admitting: Obstetrics & Gynecology

## 2010-07-10 ENCOUNTER — Inpatient Hospital Stay (HOSPITAL_COMMUNITY)
Admission: AD | Admit: 2010-07-10 | Discharge: 2010-07-10 | Payer: Self-pay | Source: Home / Self Care | Attending: Obstetrics | Admitting: Obstetrics

## 2010-07-10 LAB — URINALYSIS, ROUTINE W REFLEX MICROSCOPIC
Bilirubin Urine: NEGATIVE
Hgb urine dipstick: NEGATIVE
Protein, ur: NEGATIVE mg/dL
Urobilinogen, UA: 0.2 mg/dL (ref 0.0–1.0)

## 2010-08-13 ENCOUNTER — Emergency Department (HOSPITAL_COMMUNITY): Payer: Medicaid Other

## 2010-08-13 ENCOUNTER — Emergency Department (HOSPITAL_COMMUNITY)
Admission: EM | Admit: 2010-08-13 | Discharge: 2010-08-13 | Disposition: A | Discharge status: Home / Self Care | Payer: Medicaid Other | Attending: Emergency Medicine | Admitting: Emergency Medicine

## 2010-08-13 DIAGNOSIS — IMO0002 Reserved for concepts with insufficient information to code with codable children: Secondary | ICD-10-CM | POA: Insufficient documentation

## 2010-08-13 DIAGNOSIS — Y92009 Unspecified place in unspecified non-institutional (private) residence as the place of occurrence of the external cause: Secondary | ICD-10-CM | POA: Insufficient documentation

## 2010-08-13 DIAGNOSIS — Z79899 Other long term (current) drug therapy: Secondary | ICD-10-CM | POA: Insufficient documentation

## 2010-08-13 DIAGNOSIS — S8990XA Unspecified injury of unspecified lower leg, initial encounter: Secondary | ICD-10-CM | POA: Insufficient documentation

## 2010-08-13 DIAGNOSIS — M549 Dorsalgia, unspecified: Secondary | ICD-10-CM | POA: Insufficient documentation

## 2010-08-13 DIAGNOSIS — G8929 Other chronic pain: Secondary | ICD-10-CM | POA: Insufficient documentation

## 2010-08-13 DIAGNOSIS — X500XXA Overexertion from strenuous movement or load, initial encounter: Secondary | ICD-10-CM | POA: Insufficient documentation

## 2010-08-13 DIAGNOSIS — F3130 Bipolar disorder, current episode depressed, mild or moderate severity, unspecified: Secondary | ICD-10-CM | POA: Insufficient documentation

## 2010-08-13 DIAGNOSIS — M542 Cervicalgia: Secondary | ICD-10-CM | POA: Insufficient documentation

## 2010-08-13 DIAGNOSIS — M25569 Pain in unspecified knee: Secondary | ICD-10-CM | POA: Insufficient documentation

## 2010-08-16 ENCOUNTER — Other Ambulatory Visit: Payer: Self-pay | Admitting: Specialist

## 2010-08-16 ENCOUNTER — Ambulatory Visit
Admission: RE | Admit: 2010-08-16 | Discharge: 2010-08-16 | Disposition: A | Discharge status: Home / Self Care | Payer: Medicaid Other | Source: Ambulatory Visit | Attending: Specialist | Admitting: Specialist

## 2010-08-16 DIAGNOSIS — M549 Dorsalgia, unspecified: Secondary | ICD-10-CM

## 2010-08-17 ENCOUNTER — Emergency Department (HOSPITAL_COMMUNITY)
Admission: EM | Admit: 2010-08-17 | Discharge: 2010-08-17 | Discharge status: Against Medical Advice | Payer: Medicaid Other | Attending: Emergency Medicine | Admitting: Emergency Medicine

## 2010-08-17 ENCOUNTER — Emergency Department (HOSPITAL_COMMUNITY): Payer: Medicaid Other

## 2010-08-17 DIAGNOSIS — F4310 Post-traumatic stress disorder, unspecified: Secondary | ICD-10-CM | POA: Insufficient documentation

## 2010-08-17 DIAGNOSIS — F319 Bipolar disorder, unspecified: Secondary | ICD-10-CM | POA: Insufficient documentation

## 2010-08-17 DIAGNOSIS — M25519 Pain in unspecified shoulder: Secondary | ICD-10-CM | POA: Insufficient documentation

## 2010-08-17 DIAGNOSIS — Y92009 Unspecified place in unspecified non-institutional (private) residence as the place of occurrence of the external cause: Secondary | ICD-10-CM | POA: Insufficient documentation

## 2010-08-20 ENCOUNTER — Emergency Department (HOSPITAL_COMMUNITY)
Admission: EM | Admit: 2010-08-20 | Discharge: 2010-08-20 | Disposition: A | Discharge status: Home / Self Care | Payer: Medicaid Other | Attending: Emergency Medicine | Admitting: Emergency Medicine

## 2010-08-20 DIAGNOSIS — M542 Cervicalgia: Secondary | ICD-10-CM | POA: Insufficient documentation

## 2010-08-20 DIAGNOSIS — W19XXXA Unspecified fall, initial encounter: Secondary | ICD-10-CM | POA: Insufficient documentation

## 2010-08-20 DIAGNOSIS — R209 Unspecified disturbances of skin sensation: Secondary | ICD-10-CM | POA: Insufficient documentation

## 2010-08-20 DIAGNOSIS — S139XXA Sprain of joints and ligaments of unspecified parts of neck, initial encounter: Secondary | ICD-10-CM | POA: Insufficient documentation

## 2010-08-20 DIAGNOSIS — Y929 Unspecified place or not applicable: Secondary | ICD-10-CM | POA: Insufficient documentation

## 2010-08-20 DIAGNOSIS — M546 Pain in thoracic spine: Secondary | ICD-10-CM | POA: Insufficient documentation

## 2010-08-20 DIAGNOSIS — M79609 Pain in unspecified limb: Secondary | ICD-10-CM | POA: Insufficient documentation

## 2010-08-20 DIAGNOSIS — F3130 Bipolar disorder, current episode depressed, mild or moderate severity, unspecified: Secondary | ICD-10-CM | POA: Insufficient documentation

## 2010-08-24 ENCOUNTER — Other Ambulatory Visit: Payer: Self-pay | Admitting: Specialist

## 2010-08-24 DIAGNOSIS — M549 Dorsalgia, unspecified: Secondary | ICD-10-CM

## 2010-08-24 LAB — URINALYSIS, ROUTINE W REFLEX MICROSCOPIC
Nitrite: NEGATIVE
Specific Gravity, Urine: 1.023 (ref 1.005–1.030)
pH: 6 (ref 5.0–8.0)

## 2010-08-24 LAB — CBC
HCT: 32.7 % — ABNORMAL LOW (ref 36.0–46.0)
RDW: 18 % — ABNORMAL HIGH (ref 11.5–15.5)
WBC: 9.8 10*3/uL (ref 4.0–10.5)

## 2010-08-24 LAB — BASIC METABOLIC PANEL
BUN: 11 mg/dL (ref 6–23)
GFR calc non Af Amer: >60 mL/min (ref >60)
Potassium: 3.7 mEq/L (ref 3.5–5.1)
Sodium: 139 mEq/L (ref 135–145)

## 2010-08-24 LAB — DIFFERENTIAL
Basophils Absolute: 0 10*3/uL (ref 0.0–0.1)
Lymphocytes Relative: 28 % (ref 12–46)
Monocytes Absolute: 0.4 10*3/uL (ref 0.1–1.0)
Neutro Abs: 6.5 10*3/uL (ref 1.7–7.7)
Neutrophils Relative %: 67 % (ref 43–77)

## 2010-08-24 LAB — URINE CULTURE

## 2010-08-24 LAB — URINE MICROSCOPIC-ADD ON

## 2010-08-24 LAB — PREGNANCY, URINE: Preg Test, Ur: NEGATIVE

## 2010-08-25 LAB — URINALYSIS, ROUTINE W REFLEX MICROSCOPIC
Leukocytes, UA: NEGATIVE
Nitrite: NEGATIVE
Specific Gravity, Urine: >1.03 — ABNORMAL HIGH (ref 1.005–1.030)
Urobilinogen, UA: 0.2 mg/dL (ref 0.0–1.0)

## 2010-08-25 LAB — CBC
HCT: 33.9 % — ABNORMAL LOW (ref 36.0–46.0)
Hemoglobin: 11.2 g/dL — ABNORMAL LOW (ref 12.0–15.0)
MCH: 25 pg — ABNORMAL LOW (ref 26.0–34.0)
MCHC: 33.2 g/dL (ref 30.0–36.0)
MCV: 75.4 fL — ABNORMAL LOW (ref 78.0–100.0)
RDW: 19 % — ABNORMAL HIGH (ref 11.5–15.5)

## 2010-08-25 LAB — POCT PREGNANCY, URINE: Preg Test, Ur: NEGATIVE

## 2010-08-25 LAB — URINE MICROSCOPIC-ADD ON

## 2010-08-25 LAB — GC/CHLAMYDIA PROBE AMP, GENITAL
Chlamydia, DNA Probe: NEGATIVE
GC Probe Amp, Genital: NEGATIVE

## 2010-08-25 LAB — WET PREP, GENITAL: Yeast Wet Prep HPF POC: NONE SEEN

## 2010-08-28 ENCOUNTER — Ambulatory Visit
Admission: RE | Admit: 2010-08-28 | Discharge: 2010-08-28 | Disposition: A | Discharge status: Home / Self Care | Payer: Medicaid Other | Source: Ambulatory Visit | Attending: Specialist | Admitting: Specialist

## 2010-08-28 DIAGNOSIS — M549 Dorsalgia, unspecified: Secondary | ICD-10-CM

## 2010-08-29 LAB — URINALYSIS, ROUTINE W REFLEX MICROSCOPIC
Bilirubin Urine: NEGATIVE
Glucose, UA: NEGATIVE mg/dL
Ketones, ur: NEGATIVE mg/dL
Protein, ur: 30 mg/dL — AB
pH: 6 (ref 5.0–8.0)

## 2010-08-29 LAB — URINE MICROSCOPIC-ADD ON

## 2010-08-29 LAB — URINE CULTURE: Colony Count: >=100000

## 2010-08-29 LAB — POCT I-STAT, CHEM 8
BUN: 11 mg/dL (ref 6–23)
Calcium, Ion: 1.13 mmol/L (ref 1.12–1.32)
Chloride: 106 mEq/L (ref 96–112)
Creatinine, Ser: 0.4 mg/dL (ref 0.4–1.2)

## 2010-08-29 LAB — POCT PREGNANCY, URINE: Preg Test, Ur: NEGATIVE

## 2010-08-30 LAB — URINE MICROSCOPIC-ADD ON

## 2010-08-30 LAB — WET PREP, GENITAL
Clue Cells Wet Prep HPF POC: NONE SEEN
Trich, Wet Prep: NONE SEEN

## 2010-08-30 LAB — URINALYSIS, ROUTINE W REFLEX MICROSCOPIC
Bilirubin Urine: NEGATIVE
Glucose, UA: NEGATIVE mg/dL
Hgb urine dipstick: NEGATIVE
Ketones, ur: NEGATIVE mg/dL
Specific Gravity, Urine: >1.03 — ABNORMAL HIGH (ref 1.005–1.030)
pH: 6.5 (ref 5.0–8.0)
pH: 5.5 (ref 5.0–8.0)

## 2010-08-30 LAB — COMPREHENSIVE METABOLIC PANEL
Alkaline Phosphatase: 71 U/L (ref 39–117)
BUN: 10 mg/dL (ref 6–23)
Calcium: 8.9 mg/dL (ref 8.4–10.5)
Glucose, Bld: 100 mg/dL — ABNORMAL HIGH (ref 70–99)
Potassium: 4.1 mEq/L (ref 3.5–5.1)
Total Protein: 6.2 g/dL (ref 6.0–8.3)

## 2010-08-30 LAB — CBC
HCT: 33.2 % — ABNORMAL LOW (ref 36.0–46.0)
Hemoglobin: 10.9 g/dL — ABNORMAL LOW (ref 12.0–15.0)
MCHC: 32.8 g/dL (ref 30.0–36.0)
MCV: 79.7 fL (ref 78.0–100.0)
RDW: 17 % — ABNORMAL HIGH (ref 11.5–15.5)

## 2010-08-30 LAB — URINE CULTURE

## 2010-08-30 LAB — RAPID URINE DRUG SCREEN, HOSP PERFORMED
Barbiturates: NOT DETECTED
Opiates: POSITIVE — AB

## 2010-08-30 LAB — POCT PREGNANCY, URINE: Preg Test, Ur: NEGATIVE

## 2010-09-05 ENCOUNTER — Emergency Department (HOSPITAL_COMMUNITY)
Admission: EM | Admit: 2010-09-05 | Discharge: 2010-09-05 | Discharge status: Against Medical Advice | Payer: Medicaid Other | Attending: Emergency Medicine | Admitting: Emergency Medicine

## 2010-09-05 DIAGNOSIS — M545 Low back pain, unspecified: Secondary | ICD-10-CM | POA: Insufficient documentation

## 2010-09-05 DIAGNOSIS — M25569 Pain in unspecified knee: Secondary | ICD-10-CM | POA: Insufficient documentation

## 2010-09-05 LAB — URINALYSIS, ROUTINE W REFLEX MICROSCOPIC
Bilirubin Urine: NEGATIVE
Hgb urine dipstick: NEGATIVE
Specific Gravity, Urine: 1.024 (ref 1.005–1.030)
Urobilinogen, UA: 0.2 mg/dL (ref 0.0–1.0)
pH: 5.5 (ref 5.0–8.0)

## 2010-09-06 ENCOUNTER — Emergency Department (HOSPITAL_COMMUNITY)
Admission: EM | Admit: 2010-09-06 | Discharge: 2010-09-06 | Disposition: A | Discharge status: Home / Self Care | Payer: Medicaid Other | Attending: Emergency Medicine | Admitting: Emergency Medicine

## 2010-09-06 DIAGNOSIS — S8000XA Contusion of unspecified knee, initial encounter: Secondary | ICD-10-CM | POA: Insufficient documentation

## 2010-09-06 DIAGNOSIS — F319 Bipolar disorder, unspecified: Secondary | ICD-10-CM | POA: Insufficient documentation

## 2010-09-06 DIAGNOSIS — M542 Cervicalgia: Secondary | ICD-10-CM | POA: Insufficient documentation

## 2010-09-06 DIAGNOSIS — M545 Low back pain, unspecified: Secondary | ICD-10-CM | POA: Insufficient documentation

## 2010-09-06 DIAGNOSIS — S335XXA Sprain of ligaments of lumbar spine, initial encounter: Secondary | ICD-10-CM | POA: Insufficient documentation

## 2010-09-06 DIAGNOSIS — S139XXA Sprain of joints and ligaments of unspecified parts of neck, initial encounter: Secondary | ICD-10-CM | POA: Insufficient documentation

## 2010-09-11 ENCOUNTER — Inpatient Hospital Stay (HOSPITAL_COMMUNITY)
Admission: AD | Admit: 2010-09-11 | Discharge: 2010-09-12 | Disposition: A | Discharge status: Home / Self Care | Payer: Medicaid Other | Source: Ambulatory Visit | Attending: Obstetrics & Gynecology | Admitting: Obstetrics & Gynecology

## 2010-09-11 DIAGNOSIS — N83209 Unspecified ovarian cyst, unspecified side: Secondary | ICD-10-CM | POA: Insufficient documentation

## 2010-09-11 DIAGNOSIS — N949 Unspecified condition associated with female genital organs and menstrual cycle: Secondary | ICD-10-CM | POA: Insufficient documentation

## 2010-09-11 LAB — URINALYSIS, ROUTINE W REFLEX MICROSCOPIC
Bilirubin Urine: NEGATIVE
Glucose, UA: NEGATIVE mg/dL
Hgb urine dipstick: NEGATIVE
Specific Gravity, Urine: >1.03 — ABNORMAL HIGH (ref 1.005–1.030)
Urobilinogen, UA: 0.2 mg/dL (ref 0.0–1.0)

## 2010-09-12 ENCOUNTER — Inpatient Hospital Stay (HOSPITAL_COMMUNITY): Payer: Medicaid Other

## 2010-09-12 LAB — HCG, QUANTITATIVE, PREGNANCY: hCG, Beta Chain, Quant, S: <2 m[IU]/mL (ref <5)

## 2010-09-12 LAB — WET PREP, GENITAL
Clue Cells Wet Prep HPF POC: NONE SEEN
Trich, Wet Prep: NONE SEEN
Yeast Wet Prep HPF POC: NONE SEEN

## 2010-09-12 LAB — GC/CHLAMYDIA PROBE AMP, GENITAL
Chlamydia, DNA Probe: NEGATIVE
GC Probe Amp, Genital: NEGATIVE

## 2010-09-12 LAB — CBC
HCT: 37.1 % (ref 36.0–46.0)
Hemoglobin: 12.3 g/dL (ref 12.0–15.0)
MCV: 79.7 fL (ref 78.0–100.0)
Platelets: 285 10*3/uL (ref 150–400)
WBC: 9.7 10*3/uL (ref 4.0–10.5)

## 2010-09-12 LAB — SAMPLE TO BLOOD BANK

## 2010-09-14 LAB — CBC
HCT: 37.5 % (ref 36.0–46.0)
Hemoglobin: 12.5 g/dL (ref 12.0–15.0)
MCHC: 33.4 g/dL (ref 30.0–36.0)
MCHC: 33 g/dL (ref 30.0–36.0)
MCV: 80.2 fL (ref 78.0–100.0)
RBC: 4.71 MIL/uL (ref 3.87–5.11)
RBC: 4.65 MIL/uL (ref 3.87–5.11)
RDW: 16.3 % — ABNORMAL HIGH (ref 11.5–15.5)

## 2010-09-14 LAB — URINALYSIS, ROUTINE W REFLEX MICROSCOPIC
Bilirubin Urine: NEGATIVE
Bilirubin Urine: NEGATIVE
Glucose, UA: NEGATIVE mg/dL
Glucose, UA: NEGATIVE mg/dL
Glucose, UA: NEGATIVE mg/dL
Hgb urine dipstick: NEGATIVE
Ketones, ur: NEGATIVE mg/dL
Ketones, ur: NEGATIVE mg/dL
Nitrite: POSITIVE — AB
Protein, ur: NEGATIVE mg/dL
Protein, ur: NEGATIVE mg/dL
Specific Gravity, Urine: 1.028 (ref 1.005–1.030)
Urobilinogen, UA: 1 mg/dL (ref 0.0–1.0)
pH: 5.5 (ref 5.0–8.0)

## 2010-09-14 LAB — DIFFERENTIAL
Basophils Relative: 0 % (ref 0–1)
Eosinophils Absolute: 0.1 10*3/uL (ref 0.0–0.7)
Neutro Abs: 7.2 10*3/uL (ref 1.7–7.7)
Neutrophils Relative %: 68 % (ref 43–77)

## 2010-09-14 LAB — COMPREHENSIVE METABOLIC PANEL
ALT: 19 U/L (ref 0–35)
Alkaline Phosphatase: 68 U/L (ref 39–117)
BUN: 9 mg/dL (ref 6–23)
CO2: 23 mEq/L (ref 19–32)
Calcium: 8.6 mg/dL (ref 8.4–10.5)
GFR calc non Af Amer: >60 mL/min (ref >60)
Glucose, Bld: 83 mg/dL (ref 70–99)
Potassium: 3.6 mEq/L (ref 3.5–5.1)
Sodium: 137 mEq/L (ref 135–145)

## 2010-09-14 LAB — GC/CHLAMYDIA PROBE AMP, GENITAL
Chlamydia, DNA Probe: NEGATIVE
GC Probe Amp, Genital: NEGATIVE

## 2010-09-14 LAB — URINE CULTURE: Colony Count: >=100000

## 2010-09-14 LAB — URINE MICROSCOPIC-ADD ON

## 2010-09-14 LAB — RAPID URINE DRUG SCREEN, HOSP PERFORMED
Amphetamines: NOT DETECTED
Opiates: NOT DETECTED
Tetrahydrocannabinol: NOT DETECTED

## 2010-09-14 LAB — HCG, QUANTITATIVE, PREGNANCY: hCG, Beta Chain, Quant, S: <2 m[IU]/mL (ref <5)

## 2010-09-14 LAB — PREGNANCY, URINE: Preg Test, Ur: NEGATIVE

## 2010-09-14 LAB — POCT PREGNANCY, URINE: Preg Test, Ur: NEGATIVE

## 2010-09-14 LAB — WET PREP, GENITAL
Clue Cells Wet Prep HPF POC: NONE SEEN
Trich, Wet Prep: NONE SEEN
Trich, Wet Prep: NONE SEEN
Yeast Wet Prep HPF POC: NONE SEEN

## 2010-09-14 LAB — TRICYCLICS SCREEN, URINE: TCA Scrn: NOT DETECTED

## 2010-09-15 LAB — URINALYSIS, ROUTINE W REFLEX MICROSCOPIC
Bilirubin Urine: NEGATIVE
Glucose, UA: NEGATIVE mg/dL
Ketones, ur: NEGATIVE mg/dL
pH: 5.5 (ref 5.0–8.0)

## 2010-09-15 LAB — URINE MICROSCOPIC-ADD ON

## 2010-09-15 LAB — PREGNANCY, URINE: Preg Test, Ur: NEGATIVE

## 2010-09-16 LAB — BASIC METABOLIC PANEL
BUN: 12 mg/dL (ref 6–23)
CO2: 28 mEq/L (ref 19–32)
GFR calc non Af Amer: >60 mL/min (ref >60)
Glucose, Bld: 86 mg/dL (ref 70–99)
Potassium: 3.6 mEq/L (ref 3.5–5.1)

## 2010-09-16 LAB — CBC
HCT: 38.1 % (ref 36.0–46.0)
MCHC: 32.9 g/dL (ref 30.0–36.0)
MCV: 80 fL (ref 78.0–100.0)
Platelets: 266 10*3/uL (ref 150–400)
RDW: 17.9 % — ABNORMAL HIGH (ref 11.5–15.5)

## 2010-09-16 LAB — POCT I-STAT, CHEM 8
Creatinine, Ser: 0.6 mg/dL (ref 0.4–1.2)
Glucose, Bld: 88 mg/dL (ref 70–99)
Hemoglobin: 11.6 g/dL — ABNORMAL LOW (ref 12.0–15.0)
TCO2: 23 mmol/L (ref 0–100)

## 2010-09-16 LAB — DIFFERENTIAL
Basophils Absolute: 0.1 10*3/uL (ref 0.0–0.1)
Basophils Relative: 1 % (ref 0–1)
Eosinophils Absolute: 0.1 10*3/uL (ref 0.0–0.7)
Eosinophils Relative: 1 % (ref 0–5)
Lymphocytes Relative: 37 % (ref 12–46)
Monocytes Absolute: 0.6 10*3/uL (ref 0.1–1.0)

## 2010-09-16 LAB — POCT CARDIAC MARKERS: CKMB, poc: 2 ng/mL (ref 1.0–8.0)

## 2010-09-16 LAB — RAPID URINE DRUG SCREEN, HOSP PERFORMED
Barbiturates: NOT DETECTED
Cocaine: NOT DETECTED
Opiates: NOT DETECTED
Tetrahydrocannabinol: NOT DETECTED

## 2010-09-17 LAB — URINE MICROSCOPIC-ADD ON

## 2010-09-17 LAB — COMPREHENSIVE METABOLIC PANEL
ALT: 16 U/L (ref 0–35)
AST: 16 U/L (ref 0–37)
CO2: 27 mEq/L (ref 19–32)
Calcium: 8.7 mg/dL (ref 8.4–10.5)
Creatinine, Ser: 0.62 mg/dL (ref 0.4–1.2)
GFR calc Af Amer: >60 mL/min (ref >60)
GFR calc non Af Amer: >60 mL/min (ref >60)
Glucose, Bld: 75 mg/dL (ref 70–99)
Sodium: 138 mEq/L (ref 135–145)
Total Protein: 7.2 g/dL (ref 6.0–8.3)

## 2010-09-17 LAB — URINALYSIS, ROUTINE W REFLEX MICROSCOPIC
Glucose, UA: NEGATIVE mg/dL
Hgb urine dipstick: NEGATIVE
Hgb urine dipstick: NEGATIVE
Nitrite: NEGATIVE
Protein, ur: NEGATIVE mg/dL
Specific Gravity, Urine: 1.015 (ref 1.005–1.030)
Specific Gravity, Urine: 1.031 — ABNORMAL HIGH (ref 1.005–1.030)
Urobilinogen, UA: 1 mg/dL (ref 0.0–1.0)
pH: 5.5 (ref 5.0–8.0)

## 2010-09-17 LAB — CBC
HCT: 38.4 % (ref 36.0–46.0)
Hemoglobin: 11.9 g/dL — ABNORMAL LOW (ref 12.0–15.0)
Hemoglobin: 12.5 g/dL (ref 12.0–15.0)
MCHC: 32.5 g/dL (ref 30.0–36.0)
MCHC: 32.7 g/dL (ref 30.0–36.0)
MCV: 78.1 fL (ref 78.0–100.0)
MCV: 78.4 fL (ref 78.0–100.0)
MCV: 79.3 fL (ref 78.0–100.0)
Platelets: 305 10*3/uL (ref 150–400)
RBC: 4.47 MIL/uL (ref 3.87–5.11)
RBC: 4.76 MIL/uL (ref 3.87–5.11)
RDW: 17 % — ABNORMAL HIGH (ref 11.5–15.5)
RDW: 17.8 % — ABNORMAL HIGH (ref 11.5–15.5)
WBC: 7.7 10*3/uL (ref 4.0–10.5)

## 2010-09-17 LAB — WET PREP, GENITAL: Trich, Wet Prep: NONE SEEN

## 2010-09-17 LAB — HCG, SERUM, QUALITATIVE: Preg, Serum: NEGATIVE

## 2010-09-17 LAB — DIFFERENTIAL
Basophils Absolute: 0.1 10*3/uL (ref 0.0–0.1)
Basophils Relative: 1 % (ref 0–1)
Eosinophils Absolute: 0.1 10*3/uL (ref 0.0–0.7)
Eosinophils Relative: 1 % (ref 0–5)
Lymphocytes Relative: 20 % (ref 12–46)
Monocytes Absolute: 0.6 10*3/uL (ref 0.1–1.0)

## 2010-09-17 LAB — RAPID URINE DRUG SCREEN, HOSP PERFORMED: Barbiturates: POSITIVE — AB

## 2010-09-17 LAB — GC/CHLAMYDIA PROBE AMP, GENITAL: GC Probe Amp, Genital: NEGATIVE

## 2010-09-17 LAB — ACETAMINOPHEN LEVEL: Acetaminophen (Tylenol), Serum: <10 ug/mL — ABNORMAL LOW (ref 10–30)

## 2010-09-17 LAB — SALICYLATE LEVEL: Salicylate Lvl: <4 mg/dL (ref 2.8–20.0)

## 2010-09-18 LAB — BASIC METABOLIC PANEL
BUN: 12 mg/dL (ref 6–23)
BUN: 14 mg/dL (ref 6–23)
CO2: 23 mEq/L (ref 19–32)
CO2: 29 mEq/L (ref 19–32)
Calcium: 9.2 mg/dL (ref 8.4–10.5)
Chloride: 103 mEq/L (ref 96–112)
Chloride: 103 mEq/L (ref 96–112)
Creatinine, Ser: 0.86 mg/dL (ref 0.4–1.2)
GFR calc Af Amer: >60 mL/min (ref >60)
GFR calc non Af Amer: >60 mL/min (ref >60)
Glucose, Bld: 103 mg/dL — ABNORMAL HIGH (ref 70–99)
Potassium: 3.7 mEq/L (ref 3.5–5.1)
Potassium: 3.7 mEq/L (ref 3.5–5.1)
Sodium: 137 mEq/L (ref 135–145)

## 2010-09-18 LAB — BARBITURATE, URINE, CONFIRMATION
Amobarbital UR Quant: NEGATIVE
Butabarbital UR Quant: NEGATIVE
Butalbital UR Quant: 860 ng/mL
Pentobarbital GC/MS Conf: NEGATIVE
Phenobarbital GC/MS Conf: NEGATIVE
Secobarbital GC/MS Conf: NEGATIVE

## 2010-09-18 LAB — ETHANOL: Alcohol, Ethyl (B): 5 mg/dL (ref 0–10)

## 2010-09-18 LAB — URINE MICROSCOPIC-ADD ON

## 2010-09-18 LAB — URINE DRUGS OF ABUSE SCREEN W ALC, ROUTINE (REF LAB)
Amphetamine Screen, Ur: NEGATIVE
Barbiturate Quant, Ur: POSITIVE — AB
Benzodiazepines.: NEGATIVE
Cocaine Metabolites: NEGATIVE
Creatinine,U: 143.9 mg/dL
Ethyl Alcohol: <10 mg/dL (ref <10)
Marijuana Metabolite: NEGATIVE
Methadone: NEGATIVE
Opiate Screen, Urine: NEGATIVE
Phencyclidine (PCP): NEGATIVE
Propoxyphene: NEGATIVE

## 2010-09-18 LAB — COMPREHENSIVE METABOLIC PANEL
ALT: 17 U/L (ref 0–35)
AST: 19 U/L (ref 0–37)
Albumin: 3 g/dL — ABNORMAL LOW (ref 3.5–5.2)
Alkaline Phosphatase: 81 U/L (ref 39–117)
Chloride: 104 mEq/L (ref 96–112)
GFR calc Af Amer: >60 mL/min (ref >60)
Potassium: 3.9 mEq/L (ref 3.5–5.1)
Sodium: 138 mEq/L (ref 135–145)
Total Bilirubin: 0.3 mg/dL (ref 0.3–1.2)
Total Protein: 7.2 g/dL (ref 6.0–8.3)

## 2010-09-18 LAB — DIFFERENTIAL
Basophils Absolute: 0 10*3/uL (ref 0.0–0.1)
Basophils Relative: 0 % (ref 0–1)
Eosinophils Absolute: 0.1 10*3/uL (ref 0.0–0.7)
Eosinophils Absolute: 0.1 10*3/uL (ref 0.0–0.7)
Eosinophils Relative: 1 % (ref 0–5)
Eosinophils Relative: 2 % (ref 0–5)
Lymphocytes Relative: 32 % (ref 12–46)
Lymphs Abs: 1.8 10*3/uL (ref 0.7–4.0)
Lymphs Abs: 2.7 10*3/uL (ref 0.7–4.0)
Monocytes Absolute: 0.5 10*3/uL (ref 0.1–1.0)
Monocytes Relative: 6 % (ref 3–12)
Monocytes Relative: 6 % (ref 3–12)
Neutro Abs: 5.2 10*3/uL (ref 1.7–7.7)
Neutrophils Relative %: 61 % (ref 43–77)

## 2010-09-18 LAB — CBC
HCT: 34.6 % — ABNORMAL LOW (ref 36.0–46.0)
HCT: 37.2 % (ref 36.0–46.0)
HCT: 36.8 % (ref 36.0–46.0)
Hemoglobin: 12.4 g/dL (ref 12.0–15.0)
MCHC: 33.1 g/dL (ref 30.0–36.0)
MCHC: 33.3 g/dL (ref 30.0–36.0)
MCV: 76.9 fL — ABNORMAL LOW (ref 78.0–100.0)
MCV: 77.1 fL — ABNORMAL LOW (ref 78.0–100.0)
Platelets: 254 10*3/uL (ref 150–400)
Platelets: 326 10*3/uL (ref 150–400)
Platelets: 280 10*3/uL (ref 150–400)
RBC: 4.49 MIL/uL (ref 3.87–5.11)
RBC: 4.84 MIL/uL (ref 3.87–5.11)
RDW: 17.5 % — ABNORMAL HIGH (ref 11.5–15.5)
RDW: 18.4 % — ABNORMAL HIGH (ref 11.5–15.5)
WBC: 7.1 10*3/uL (ref 4.0–10.5)
WBC: 8.5 10*3/uL (ref 4.0–10.5)
WBC: 10 10*3/uL (ref 4.0–10.5)

## 2010-09-18 LAB — URINALYSIS, ROUTINE W REFLEX MICROSCOPIC
Bilirubin Urine: NEGATIVE
Glucose, UA: NEGATIVE mg/dL
Glucose, UA: NEGATIVE mg/dL
Ketones, ur: NEGATIVE mg/dL
Nitrite: NEGATIVE
Protein, ur: NEGATIVE mg/dL
Urobilinogen, UA: 0.2 mg/dL (ref 0.0–1.0)
pH: 5.5 (ref 5.0–8.0)

## 2010-09-18 LAB — RAPID URINE DRUG SCREEN, HOSP PERFORMED
Barbiturates: POSITIVE — AB
Opiates: POSITIVE — AB
Tetrahydrocannabinol: NOT DETECTED

## 2010-09-18 LAB — POCT PREGNANCY, URINE: Preg Test, Ur: NEGATIVE

## 2010-09-19 ENCOUNTER — Emergency Department (HOSPITAL_COMMUNITY)
Admission: EM | Admit: 2010-09-19 | Discharge: 2010-09-19 | Disposition: A | Discharge status: Home / Self Care | Payer: Medicaid Other | Attending: Emergency Medicine | Admitting: Emergency Medicine

## 2010-09-19 ENCOUNTER — Emergency Department (HOSPITAL_COMMUNITY): Payer: Medicaid Other

## 2010-09-19 DIAGNOSIS — M25469 Effusion, unspecified knee: Secondary | ICD-10-CM | POA: Insufficient documentation

## 2010-09-19 DIAGNOSIS — Y929 Unspecified place or not applicable: Secondary | ICD-10-CM | POA: Insufficient documentation

## 2010-09-19 DIAGNOSIS — F3130 Bipolar disorder, current episode depressed, mild or moderate severity, unspecified: Secondary | ICD-10-CM | POA: Insufficient documentation

## 2010-09-19 DIAGNOSIS — S8990XA Unspecified injury of unspecified lower leg, initial encounter: Secondary | ICD-10-CM | POA: Insufficient documentation

## 2010-09-19 DIAGNOSIS — M25569 Pain in unspecified knee: Secondary | ICD-10-CM | POA: Insufficient documentation

## 2010-09-19 DIAGNOSIS — W010XXA Fall on same level from slipping, tripping and stumbling without subsequent striking against object, initial encounter: Secondary | ICD-10-CM | POA: Insufficient documentation

## 2010-09-19 DIAGNOSIS — M7989 Other specified soft tissue disorders: Secondary | ICD-10-CM | POA: Insufficient documentation

## 2010-09-19 DIAGNOSIS — R609 Edema, unspecified: Secondary | ICD-10-CM | POA: Insufficient documentation

## 2010-09-19 LAB — COMPREHENSIVE METABOLIC PANEL
AST: 17 U/L (ref 0–37)
Albumin: 3.4 g/dL — ABNORMAL LOW (ref 3.5–5.2)
BUN: 13 mg/dL (ref 6–23)
CO2: 23 mEq/L (ref 19–32)
Calcium: 9.7 mg/dL (ref 8.4–10.5)
Chloride: 106 mEq/L (ref 96–112)
Creatinine, Ser: 0.64 mg/dL (ref 0.4–1.2)
Creatinine, Ser: 0.8 mg/dL (ref 0.4–1.2)
GFR calc Af Amer: >60 mL/min (ref >60)
GFR calc non Af Amer: >60 mL/min (ref >60)
Glucose, Bld: 80 mg/dL (ref 70–99)
Total Bilirubin: 0.4 mg/dL (ref 0.3–1.2)
Total Bilirubin: 0.5 mg/dL (ref 0.3–1.2)
Total Protein: 7.6 g/dL (ref 6.0–8.3)

## 2010-09-19 LAB — POCT I-STAT, CHEM 8
BUN: 10 mg/dL (ref 6–23)
Creatinine, Ser: 0.7 mg/dL (ref 0.4–1.2)
Glucose, Bld: 118 mg/dL — ABNORMAL HIGH (ref 70–99)
Hemoglobin: 11.9 g/dL — ABNORMAL LOW (ref 12.0–15.0)
TCO2: 26 mmol/L (ref 0–100)

## 2010-09-19 LAB — RAPID URINE DRUG SCREEN, HOSP PERFORMED
Benzodiazepines: POSITIVE — AB
Cocaine: NOT DETECTED
Opiates: NOT DETECTED

## 2010-09-19 LAB — URINE MICROSCOPIC-ADD ON

## 2010-09-19 LAB — URINE CULTURE

## 2010-09-19 LAB — CBC
HCT: 34.3 % — ABNORMAL LOW (ref 36.0–46.0)
HCT: 36.4 % (ref 36.0–46.0)
HCT: 37.2 % (ref 36.0–46.0)
Hemoglobin: 12.2 g/dL (ref 12.0–15.0)
MCHC: 33.5 g/dL (ref 30.0–36.0)
MCHC: 33.9 g/dL (ref 30.0–36.0)
MCV: 75.5 fL — ABNORMAL LOW (ref 78.0–100.0)
MCV: 75.9 fL — ABNORMAL LOW (ref 78.0–100.0)
Platelets: 271 10*3/uL (ref 150–400)
Platelets: 307 10*3/uL (ref 150–400)
RBC: 4.79 MIL/uL (ref 3.87–5.11)
RDW: 17.3 % — ABNORMAL HIGH (ref 11.5–15.5)
RDW: 17.4 % — ABNORMAL HIGH (ref 11.5–15.5)
WBC: 8.8 10*3/uL (ref 4.0–10.5)
WBC: 10 10*3/uL (ref 4.0–10.5)

## 2010-09-19 LAB — URINALYSIS, ROUTINE W REFLEX MICROSCOPIC
Bilirubin Urine: NEGATIVE
Glucose, UA: NEGATIVE mg/dL
Glucose, UA: NEGATIVE mg/dL
Ketones, ur: NEGATIVE mg/dL
Ketones, ur: NEGATIVE mg/dL
Ketones, ur: NEGATIVE mg/dL
Leukocytes, UA: NEGATIVE
Nitrite: NEGATIVE
Protein, ur: NEGATIVE mg/dL
Protein, ur: NEGATIVE mg/dL
Specific Gravity, Urine: 1.024 (ref 1.005–1.030)
pH: 6 (ref 5.0–8.0)
pH: 5.5 (ref 5.0–8.0)

## 2010-09-19 LAB — DIFFERENTIAL
Basophils Absolute: 0 10*3/uL (ref 0.0–0.1)
Basophils Absolute: 0 10*3/uL (ref 0.0–0.1)
Eosinophils Absolute: 0.1 10*3/uL (ref 0.0–0.7)
Eosinophils Relative: 1 % (ref 0–5)
Lymphocytes Relative: 29 % (ref 12–46)
Lymphocytes Relative: 30 % (ref 12–46)
Monocytes Absolute: 0.5 10*3/uL (ref 0.1–1.0)
Monocytes Relative: 5 % (ref 3–12)
Neutro Abs: 6.2 10*3/uL (ref 1.7–7.7)
Neutrophils Relative %: 63 % (ref 43–77)

## 2010-09-19 LAB — RPR: RPR Ser Ql: NONREACTIVE

## 2010-09-19 LAB — GC/CHLAMYDIA PROBE AMP, URINE: Chlamydia, Swab/Urine, PCR: NEGATIVE

## 2010-09-24 ENCOUNTER — Inpatient Hospital Stay (HOSPITAL_COMMUNITY)
Admission: AD | Admit: 2010-09-24 | Discharge: 2010-09-24 | Disposition: A | Discharge status: Home / Self Care | Payer: No Typology Code available for payment source | Source: Ambulatory Visit | Attending: Obstetrics & Gynecology | Admitting: Obstetrics & Gynecology

## 2010-09-26 LAB — POCT I-STAT, CHEM 8
Calcium, Ion: 1.13 mmol/L (ref 1.12–1.32)
Chloride: 102 mEq/L (ref 96–112)
Glucose, Bld: 78 mg/dL (ref 70–99)
HCT: 38 % (ref 36.0–46.0)

## 2010-09-26 LAB — CBC
HCT: 35.8 % — ABNORMAL LOW (ref 36.0–46.0)
Hemoglobin: 11.6 g/dL — ABNORMAL LOW (ref 12.0–15.0)
MCHC: 32.4 g/dL (ref 30.0–36.0)
MCV: 74.6 fL — ABNORMAL LOW (ref 78.0–100.0)
RBC: 4.79 MIL/uL (ref 3.87–5.11)

## 2010-09-26 LAB — URINALYSIS, ROUTINE W REFLEX MICROSCOPIC
Nitrite: NEGATIVE
Protein, ur: NEGATIVE mg/dL
Urobilinogen, UA: 0.2 mg/dL (ref 0.0–1.0)

## 2010-09-26 LAB — DIFFERENTIAL
Basophils Relative: 1 % (ref 0–1)
Eosinophils Absolute: 0.1 10*3/uL (ref 0.0–0.7)
Monocytes Absolute: 0.4 10*3/uL (ref 0.1–1.0)
Monocytes Relative: 7 % (ref 3–12)
Neutro Abs: 2.7 10*3/uL (ref 1.7–7.7)

## 2010-09-26 LAB — WET PREP, GENITAL

## 2010-09-26 LAB — URINE MICROSCOPIC-ADD ON

## 2010-10-17 ENCOUNTER — Inpatient Hospital Stay (HOSPITAL_COMMUNITY)
Admission: AD | Admit: 2010-10-17 | Discharge: 2010-10-18 | DRG: 885 | Disposition: A | Discharge status: Home / Self Care | Payer: Medicaid Other | Source: Ambulatory Visit | Attending: Psychiatry | Admitting: Psychiatry

## 2010-10-17 ENCOUNTER — Emergency Department (HOSPITAL_COMMUNITY)
Admission: EM | Admit: 2010-10-17 | Discharge: 2010-10-17 | Disposition: A | Discharge status: Other Acute Inpatient Hospital | Payer: Medicaid Other | Attending: Emergency Medicine | Admitting: Emergency Medicine

## 2010-10-17 DIAGNOSIS — M1990 Unspecified osteoarthritis, unspecified site: Secondary | ICD-10-CM

## 2010-10-17 DIAGNOSIS — R45851 Suicidal ideations: Secondary | ICD-10-CM

## 2010-10-17 DIAGNOSIS — F330 Major depressive disorder, recurrent, mild: Principal | ICD-10-CM

## 2010-10-17 DIAGNOSIS — Z560 Unemployment, unspecified: Secondary | ICD-10-CM

## 2010-10-17 DIAGNOSIS — IMO0001 Reserved for inherently not codable concepts without codable children: Secondary | ICD-10-CM

## 2010-10-17 DIAGNOSIS — F329 Major depressive disorder, single episode, unspecified: Secondary | ICD-10-CM | POA: Insufficient documentation

## 2010-10-17 DIAGNOSIS — Z79899 Other long term (current) drug therapy: Secondary | ICD-10-CM | POA: Insufficient documentation

## 2010-10-17 DIAGNOSIS — Z818 Family history of other mental and behavioral disorders: Secondary | ICD-10-CM

## 2010-10-17 DIAGNOSIS — F411 Generalized anxiety disorder: Secondary | ICD-10-CM

## 2010-10-17 DIAGNOSIS — F3289 Other specified depressive episodes: Secondary | ICD-10-CM | POA: Insufficient documentation

## 2010-10-17 LAB — URINE MICROSCOPIC-ADD ON

## 2010-10-17 LAB — URINALYSIS, ROUTINE W REFLEX MICROSCOPIC
Glucose, UA: NEGATIVE mg/dL
Hgb urine dipstick: NEGATIVE
Protein, ur: NEGATIVE mg/dL
Specific Gravity, Urine: 1.026 (ref 1.005–1.030)
pH: 5.5 (ref 5.0–8.0)

## 2010-10-17 LAB — COMPREHENSIVE METABOLIC PANEL
ALT: 17 U/L (ref 0–35)
AST: 13 U/L (ref 0–37)
Albumin: 3.2 g/dL — ABNORMAL LOW (ref 3.5–5.2)
CO2: 24 mEq/L (ref 19–32)
Calcium: 8.8 mg/dL (ref 8.4–10.5)
Chloride: 102 mEq/L (ref 96–112)
GFR calc Af Amer: >60 mL/min (ref >60)
GFR calc non Af Amer: >60 mL/min (ref >60)
Sodium: 137 mEq/L (ref 135–145)
Total Bilirubin: 0.2 mg/dL — ABNORMAL LOW (ref 0.3–1.2)

## 2010-10-17 LAB — DIFFERENTIAL
Basophils Absolute: 0 10*3/uL (ref 0.0–0.1)
Basophils Relative: 0 % (ref 0–1)
Neutro Abs: 7.7 10*3/uL (ref 1.7–7.7)
Neutrophils Relative %: 70 % (ref 43–77)

## 2010-10-17 LAB — RAPID URINE DRUG SCREEN, HOSP PERFORMED
Barbiturates: POSITIVE — AB
Benzodiazepines: POSITIVE — AB

## 2010-10-17 LAB — CBC
Hemoglobin: 11.2 g/dL — ABNORMAL LOW (ref 12.0–15.0)
Platelets: 323 10*3/uL (ref 150–400)
RBC: 4.79 MIL/uL (ref 3.87–5.11)

## 2010-10-17 LAB — POCT PREGNANCY, URINE: Preg Test, Ur: NEGATIVE
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
## 2010-10-24 NOTE — H&P (Signed)
NAMEXANDRIA, [name]               ACCOUNT NO.:  [id number]   MEDICAL RECORD NO.:  [id number]          PATIENT TYPE:  INP   LOCATION:  9149                          [facility]ACILITY:  [facility]   PHYSICIAN:  [name], M.D.DATE OF BIRTH:  21-Apr-1986   DATE OF ADMISSION:  09/01/2007  DATE OF DISCHARGE:                              HISTORY & PHYSICAL   CHIEF COMPLAINT:  The patient is a 25 year old gravida 3, para 1 with an  estimated date of confinement of October 05, 2007 with an intrauterine  pregnancy at 35+ weeks, known urolithiasis, nephrolithiasis complaining  of flank pain and hematuria.   HISTORY OF PRESENT ILLNESS:  Please see the above.  The patient has had  multiple hospitalizations with similar complaints.  She has known uric  acid stones.   ALLERGIES:  SULFA and DARVOCET.   MEDICATIONS:  Please see the medication reconciliation form.   OBSTETRIC RISK FACTORS:  OB risk factors, please see the above.  Obesity.   PAST OBSTETRICAL HISTORY:  There is a history of a spontaneous abortion.  In March 2005 she was delivered of a live born female 6 pounds 4 oz,  full term vaginal delivery with abruptio placenta.  Prenatal labs, urine  culture and sensitivity September 2008:  No growth.  1-hour GCT 106.  Hepatitis B surface antigen negative, hematocrit 33.2, hemoglobin 10.6,  HIV nonreactive, platelets 271,000.  Blood type is A+, antibody screen  negative, RPR nonreactive, rubella nonimmune.  Varicella immune.  Ultrasound on January 6 at 24 weeks 2 days no previa, normal amniotic  fluid index.  Estimated fetal weight percentile was at the 50th  percentile.   GYNECOLOGICAL HISTORY:  Past GYN history noncontributory.   PAST MEDICAL HISTORY:  Please see the above.   PAST SURGICAL HISTORY:  Procedure related to her kidney stones.   SOCIAL HISTORY:  She is a homemaker, single, formally minimal alcohol  user.  Previously smoked less than one half pack per day.   FAMILY HISTORY:   Congestive heart failure heart disease, arthritis, bone  cancer, adult onset diabetes, hypertension, hypothyroidism, kidney  failure, kidney stones.   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   REVIEW OF SYSTEMS:  GENERAL:  She denies fevers.  GU: Please see the  above.   PHYSICAL EXAMINATION:  VITAL SIGNS:  Stable, afebrile.  ABDOMEN:  Gravid, nontender.  BACK:  No CVA tenderness.  PELVIC:  Deferred.  Urine  dip in the office 1+ rbc's.   ASSESSMENT:  1. Intrauterine pregnancy at 35+ weeks.  2. History of urolithiasis now with pain and hematuria possibly in the      process of passing a stone.   PLAN:  Admission, supportive care.      [name], M.D.  Electronically Signed     LAJ/MEDQ  D:  09/01/2007  T:  09/02/2007  Job:  [id number]
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
## 2010-10-24 NOTE — Discharge Summary (Signed)
NAME:  [name], [name]               ACCOUNT NO.:  [id number]   MEDICAL RECORD NO.:  [id number]          PATIENT TYPE:  INP   LOCATION:  9144                          [facility]ILITY[facility]  [facility]   PHYSICIAN:  Charles A. Clearance Coots, M.D.DATE OF BIRTH:  11/01/1985   DATE OF ADMISSION:  07/06/2007  DATE OF DISCHARGE:  07/11/2007                               DISCHARGE SUMMARY   ADMITTING DIAGNOSES:  1. Twenty-seven weeks gestation.  2. Urolithiasis.   DISCHARGE DIAGNOSES:  1. Twenty-seven weeks gestation.  2. Urolithiasis.  Improved after IV hydration and supportive management, discharged home  undelivered at approximately 28 weeks' gestation, in good condition.   REASON FOR ADMISSION:  A 25 year old white female G3, P1, estimated date  of confinement of October 05, 2007, known history of urolithiasis and  nephrolithiasis, presented to the office with a complaint of back pain  and blood in her urine.  The patient has a history of multiple  hospitalizations with similar complaints.  On recent admission, she  passed a stone that was sent for analysis.   PAST MEDICAL HISTORY:  Surgery:  Procedure for taking sounds.  Illnesses:  Urolithiasis and nephrolithiasis.   MEDICATIONS:  Prenatal vitamins, Percocet.   ALLERGIES:  SULFA, DARVOCET.   SOCIAL HISTORY:  Single, positive tobacco, negative alcohol or  recreational drug use.   FAMILY HISTORY:  Congestive heart failure, arthritis, diabetes,  hypertension, hypothyroidism, renal disease.   PHYSICAL EXAM:  GENERAL:  Obese white female in no acute distress.  She  is afebrile, vital signs are stable.  LUNGS:  Clear to auscultation bilaterally.  HEART:  Regular rate and rhythm.  ABDOMEN:  Nontender.  BACK:  Positive right CVA tenderness.   ADMITTING LABORATORY VALUES:  Hemoglobin 10, hematocrit 30, white blood  cell count 10,000, platelets 300,000, sodium 135, potassium 3.2, glucose  135.  Urinalysis: Specific gravity greater than 1.030, moderate  hemoglobin, small bilirubin, 15 ketones, 30 protein, many bacteria, 11-  20 white blood cells, 7-10 red blood cells.   HOSPITAL COURSE:  The patient was admitted and started on IV fluid  hydration, IV antibiotics and supportive analgesic therapy.  She  responded well to therapy, and by hospital day #4, the patient thought  that she passed a part of a stone and was feeling much better.  She was  therefore discharged home on hospital day #5 at [redacted] weeks gestation, much  improved, undelivered in good condition.   DISCHARGE LABORATORY VALUES:  Urine culture was negative.   DISCHARGE DISPOSITION:  Medications:  Continue prenatal vitamins.  Percocet was prescribed as needed for pain.  The patient is to the  office for a follow-up appointment in 1 week.      Charles A. Clearance Coots, M.D.  Electronically Signed     CAH/MEDQ  D:  07/11/2007  T:  07/11/2007  Job:  [id number]   cc:   Veverly Fells. Vernie Ammons, M.D.  Fax: [phone]
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
## 2010-10-24 NOTE — H&P (Signed)
NAME:  [name], [name]               ACCOUNT NO.:  [id number]   MEDICAL RECORD NO.:  [id number]          PATIENT TYPE:  INP   LOCATION:  9144                          [facility]ACILITY:  [facility]   PHYSICIAN:  [name], M.D.DATE OF BIRTH:  [birth year]   DATE OF ADMISSION:  07/06/2007  DATE OF DISCHARGE:                              HISTORY & PHYSICAL   CHIEF COMPLAINT:  The patient is a 25 year old, gravida 3, para 1 with  an estimated date of confinement of October 05, 2007 with an intrauterine  pregnancy at 27+ weeks with known urolithiasis,  nephrolithiasis  complaining of pain and blood in her urine.   HISTORY OF PRESENT ILLNESS:  Please see the above. The patient has had  multiple hospitalizations with similar complaints. On a recent  admission, she passed a stone that was sent for analysis.   ALLERGIES:  SULFA, DARVOCET.   MEDICATIONS:  Please see the medication reconciliation form.   OB RISK FACTORS:  Please see the above, obesity.   PAST OBSTETRICAL HISTORY:  There is a history of a spontaneous abortion.  In March 2005 she was delivered of a live born female 6 pounds 4 ounces  full term vaginal delivery likely abruptio placenta.   PRENATAL LABS:  Urine culture and sensitivity September 2008 no growth.  1-hour GTT 106.  Hepatitis B surface antigen negative, hematocrit 33.2,  hemoglobin 10.6, HIV nonreactive, platelets 271,000. Blood type is A+,  antibody screen negative, RPR nonreactive, rubella nonimmune.  Varicella  immune.   An ultrasound on January 6 at 24 weeks 2 days no previa, normal amniotic  fluid.  Estimated fetal weight percentile at the 50th percentile.   GYN HISTORY:  Noncontributory.   PAST MEDICAL HISTORY:  Please see the above.   PAST SURGICAL HISTORY:  Procedure for kidney stones.   SOCIAL HISTORY:  She is a homemaker, single, formally a minimal alcohol  user, previously smoked less than 1/2 pack per day.   FAMILY HISTORY:  Congestive heart failure,  heart disease, arthritis,  bone cancer, adult-onset diabetes, hypertension, hypothyroidism, kidney  failure, kidney stones.   GENETIC HISTORY:  Down syndrome and cerebral palsy.   REVIEW OF SYSTEMS:  GENERAL:  She denies a fever. GI: She complains of  nausea and vomiting.  GU: Please see the above.   PHYSICAL EXAM:  VITAL SIGNS:  Stable, afebrile.  GENERAL:  Appears uncomfortable.  BACK:  No CVA tenderness.  STERILE VAGINAL EXAM:  Per the RN the cervix is closed.   Urinalysis poor clean catch specimen; however, there are many WBCs and  many RBCs.   ASSESSMENT:  Intrauterine pregnancy at 27+ weeks, history of  urolithiasis now with pain and hematuria, pyuria possibly.   PLAN:  Admission, supportive care. Will consult with urology.  We will  start antibiotics empirically.      [name], M.D.  Electronically Signed     LAJ/MEDQ  D:  07/07/2007  T:  07/07/2007  Job:  [id number]

## 2010-10-24 NOTE — Discharge Summary (Signed)
[name], [name]               ACCOUNT NO.:  [id number]   MEDICAL RECORD NO.:  [id number]          PATIENT TYPE:  IPS   LOCATION:  0303                          [facility]ACILITY:  [facility]   PHYSICIAN:  Jasmine Pang, M.D. DATE OF BIRTH:  Jul 04, 1985   DATE OF ADMISSION:  01/15/2009  DATE OF DISCHARGE:  01/16/2009                               DISCHARGE SUMMARY   IDENTIFYING INFORMATION:  Dictation ended at this point.      Jasmine Pang, M.D.  Electronically Signed     BHS/MEDQ  D:  01/25/2009  T:  01/26/2009  Job:  [id number]

## 2010-10-24 NOTE — Consult Note (Signed)
NAME:  [name], [name]               ACCOUNT NO.:  [id number]   MEDICAL RECORD NO.:  [id number]          PATIENT TYPE:  INP   LOCATION:  9318                          [facility]ACILITY:  [facility]   PHYSICIAN:  Sigmund I. Patsi Sears, M.D.DATE OF BIRTH:  [birth year]   DATE OF CONSULTATION:  05/12/2007  DATE OF DISCHARGE:                                 CONSULTATION   HISTORY:  [name] is a 25 year old markedly obese 18-week pregnant  female, seen today on emergency basis because of possible kidney stone.  The patient is 5 foot 7, weighs 360 pounds, is para 1-1-0.  She noted  right flank pain 1 week ago, with a single episode of gross, painless  hematuria, without clots.  No dysuria.  No fever, no chills.  The  patient had some right-sided pain, with nausea, vomiting, was seen in  the emergency room on Saturday and admitted to [facility].  The  patient was felt to have possible bladder infection, and covered with IV  antibiotics.  Ultrasound suggested slight pelvic caliectasis versus  early right hydronephrosis.   PAST MEDICAL HISTORY:  Significant for kidney stones, treated by Dr.  Vernie Ammons in the past.  She had been successfully treated with Uroxatral  and straining of urine in the past.  In addition, the patient has had  two surgeries per Dr. Sabino Gasser in [facility] for kidney stones (the  patient used to live in [facility]).   Tobacco is none.  Alcohol none.   SOCIAL HISTORY:  The patient lives alone with her daughter.  She is  unmarried.  Her mother is currently taking care of her daughter.   PAST SURGICAL HISTORY:  Basket extraction of stones bilaterally only.   Soda's:  The patient drinks 3-4 glasses of tea per day.  She drinks only  occasional soft drink.  She drinks juice and milk only.  Currently, the  patient has been treated in the past with Uroxatral per Dr. Vernie Ammons.   PHYSICAL EXAMINATION:  The patient is an obese Caucasian female in no  acute distress.  Blood pressure 107/58,  pulse 77, temperature 97.9,  respiratory rate 20.  NECK:  Supple, nontender.  No nodes.  CHEST:  Clear to P&A.  ABDOMEN:  Obese.  Positive bowel sounds without organomegaly and without  masses.  There is no CVA pain or right lower quadrant pain to palpation  today.  There is no suprapubic pain.  EXTREMITIES:  No cyanosis, no  edema.  PSYCHOLOGIC:  Normal.  Orientated to time, person and place.   Admission urinalysis shows specific gravity greater than 1.30, with  nitrite negative, 10 to 20 red blood cells, too numerous to count white  blood cells, and 4+ bacteria.   Ultrasound is as noted above.   ASSESSMENT:  Patient with history of right flank pain, and single  episode of gross hematuria.  Her exam is completely benign at this time.  She is being covered with IV antibiotics and strain urine.  She cannot  take alpha  blockers such as Uroxatral because of her pregnancy.  She could be  treated with urinary  straining only to try to capture a stone.  She  should return to Dr. Vernie Ammons in the future for  urologic care.      Sigmund I. Patsi Sears, M.D.  Electronically Signed     SIT/MEDQ  D:  05/12/2007  T:  05/12/2007  Job:  [id number]   cc:   Tamela Oddi, M.D.
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
## 2010-10-24 NOTE — Discharge Summary (Signed)
NAME:  [name], [name]               ACCOUNT NO.:  [id number]   MEDICAL RECORD NO.:  [id number]          PATIENT TYPE:  INP   LOCATION:  9318                          FACILITY:  [facility]   PHYSICIAN:  Charles A. Clearance Coots, M.D.DATE OF BIRTH:  1985/09/06   DATE OF ADMISSION:  02/28/2007  DATE OF DISCHARGE:  03/04/2007                               DISCHARGE SUMMARY   ADMITTING DIAGNOSES:  1. First trimester pregnancy, approximately [redacted] weeks gestation.  2. Nausea and vomiting.   DISCHARGE DIAGNOSIS:  1. First trimester pregnancy, approximately [redacted] weeks gestation.  2. Nausea and vomiting.  3. Status post IV fluid hydration and supportive management, much      improved.  4. Discharged home undelivered at [redacted] weeks gestation in good condition.   REASON FOR ADMISSION:  A 25 year old obese white female G3, P1 who  presents with nausea, vomiting and cannot keep anything down.   PAST MEDICAL HISTORY:  1. Surgery:  Removed kidney stones in 2007.  2. Illnesses:  Kidney stone.   MEDICATION:  Zofran.   ALLERGIES:  DARVOCET and SULFA.   SOCIAL HISTORY:  Single.  Positive tobacco, negative alcohol or  recreational drug use.   PHYSICAL EXAMINATION:  VITAL SIGNS:  She is afebrile, vital signs were  stable.  GENERAL APPEARANCE:  Obese female in no acute distress.  LUNGS:  Clear to auscultation bilaterally.  HEART:  Regular rate and rhythm.  ABDOMEN:  Nontender.  PELVIC:  Exam was omitted.   ADMITTING LABORATORY VALUES:  Urinalysis revealed specific gravity of  greater than 1.030, small leukocyte esterase.  Microscopic revealed 11-  20 white blood cells, rare bacteria, few epithelials.   IMPRESSION:  1. First trimester pregnancy.  2. Hyperemesis.   PLAN:  Admit for IV fluid hydration and supportive management.   HOSPITAL COURSE:  The patient was admitted and started on IV fluid  hydration and initially was started on Phenergan for nausea but did not  respond well to the Phenergan and was  switched to Zofran and had good  control of nausea and was able to begin eating clear liquids.  The diet  was slowly advanced while on Zofran and IV fluids and she progressively  was able to advance her diet until hospital day #2, where she was able  to tolerate a soft diet but had constipation.  The patient was given a  glycerin suppository for constipation.  By hospital day #4, was  tolerating a regular diet without significant nausea.  The patient was  therefore discharged home on hospital day #4, able to tolerate food in  good condition.   DISCHARGE MEDICATIONS:  1. Zofran 4 mg p.o. q.6h. as needed for nausea.  2. Continue prenatal vitamins as tolerated.   Routine written instructions were given for nausea and vomiting in  pregnancy.  The patient is to call the office for a follow-up  appointment in 1 week.      Charles A. Clearance Coots, M.D.  Electronically Signed     CAH/MEDQ  D:  03/04/2007  T:  03/04/2007  Job:  [id number]
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
## 2010-10-24 NOTE — H&P (Signed)
NAME:  [name], [name]               ACCOUNT NO.:  [id number]   MEDICAL RECORD NO.:  [id number]          PATIENT TYPE:  INP   LOCATION:  1432                         FACILITY:  [facility]   PHYSICIAN:  Lindaann Slough, M.D.  DATE OF BIRTH:  1985-12-24   DATE OF ADMISSION:  12/29/2006  DATE OF DISCHARGE:                              HISTORY & PHYSICAL   CHIEF COMPLAINT:  Right flank pain, nausea, vomiting.   HISTORY OF PRESENT ILLNESS:  The patient is 25 year old female who was  seen in the emergency room last night with gradual onset of right flank  pain radiating to the right lower quadrant.  The pain gradually got  worse and was associated with nausea and vomiting, dysuria, and  hematuria.  A CT scan showed bilateral nonobstructing renal calculi.  The patient was sent home on Vicodin, Phenergan and Cipro, to follow-up  with Dr. Vernie Ammons.  However, she continued to complain of pain, nausea  and vomiting, and she returned to the emergency room and was then  admitted.   PAST MEDICAL HISTORY:  Positive for depression, kidney stones.   SOCIAL HISTORY:  She is single, has one child.  Has smoked about three  cigarettes a day for 5 years and drinks moderately.   FAMILY HISTORY:  Positive for kidney stone in her mother and her sister.  She has one brother.   MEDICATIONS:  Seroquel  and Effexor.   ALLERGIES:  No known drug allergies.   REVIEW OF SYSTEMS:  Positive for nausea, vomiting, right flank pain,  dysuria, frequency and hematuria.  Everything else is negative.   PHYSICAL EXAMINATION:  This is an obese 25 year old female who is  complaining of right flank pain.  Blood pressure is 136/74, pulse 91, respiration 15, temperature 97.7.  SKIN:  Warm and dry.  Head is normal.  She has pink conjunctive.  Ears, nose and throat are  within normal limits.  NECK:  Supple.  No cervical lymph nodes, no thyromegaly.  CHEST:  Symmetrical.  Lungs are fully expanded and clear to percussion  and  auscultation.  HEART:  Regular rate,  ABDOMEN:  Obese.  She has mild tenderness in the right flank and she has  mild right CVA tenderness.  Kidneys are not palpable.  She has no  hepatomegaly, no splenomegaly.  Bladder is not distended.  I did not do a pelvic examination nor a rectal examination.   Urinalysis shows too numerous to count wbc's, too numerous to count  rbc's, nitrite negative.   IMPRESSION:  1. Bilateral renal stones.  2. Question pyelonephritis.  3. Morbid obesity.   PLAN:  IV fluids, IV Cipro, IV Toradol.      Lindaann Slough, M.D.  Electronically Signed     MN/MEDQ  D:  12/29/2006  T:  12/30/2006  Job:  [id number]

## 2010-10-27 NOTE — Discharge Summary (Signed)
NAME:  [name], [name]                         ACCOUNT NO.:  [id number]   MEDICAL RECORD NO.:  [id number]                   PATIENT TYPE:  INP   LOCATION:  9152                                 [facility]ILITY[facility]  [facility]   PHYSICIAN:  [name], M.D.         DATE OF BIRTH:  24-Nov-1985   DATE OF ADMISSION:  05/25/2003  DATE OF DISCHARGE:  05/28/2003                                 DISCHARGE SUMMARY   CHIEF COMPLAINT:  The patient is a 25 year old with an intrauterine  pregnancy at 25 weeks complaining of lower abdominal cramping, back pain,  and vaginal spotting.   HISTORY OF PRESENT ILLNESS:  The patient reported onset of bright red  bleeding the evening prior to presentation with cramps and back pain.  She  denies any further trauma or recent coitus, vaginal discharge.  She reports  good fetal movement.  The patient has a known documented by ultrasound  abruption approximately six weeks ago.  This has been resolving by serial  ultrasound studies.   ALLERGIES:  No known drug allergies.   MEDICATIONS:  1. Procardia.  2. Albuterol.   PAST MEDICAL HISTORY:  Asthma.   FAMILY HISTORY:  Depression, diabetes, and hypertension.   PHYSICAL EXAMINATION:  VITAL SIGNS:  Temperature 97.7, pulse 84, respiratory  rate 18, blood pressure 116/72, weight 362 pounds.  GENERAL:  No apparent distress.  HEENT:  Normocephalic, atraumatic.  LUNGS:  Clear to auscultation bilaterally.  ABDOMEN:  Soft, nontender, gravid.  PELVIC:  External female genitalia are normal appearing.  On speculum  examination there is scant brown discharge in the posterior fornix.  Cervix  appears closed.  EXTREMITIES:  No edema.   ASSESSMENT:  Intrauterine pregnancy at 25 weeks with rule out possible  marginal placental separation.   PLAN:  Admit to [facility] for observation.   HOSPITAL COURSE:  The patient was subsequently admitted.  Ultrasound on  hospital day #1 demonstrated resolution of the previously  seen  retroplacental blood clot.  The estimated fetal weight was consistent with  appropriate growth and the cervix was 4.3 cm long with no funneling or  beaking.  The patient's spotting resolved and her cramping decreased.  No  contractions were noted with the tocodynamometer throughout her hospital  stay.  She was discharged to home on hospital day #3.  Please note that  during her hospitalization she was continued on Procardia.   DISCHARGE DIAGNOSES:  1. Intrauterine pregnancy at 25+ weeks.  2. Threatened preterm labor.   CONDITION ON DISCHARGE:  Stable.   DIET:  Regular.   ACTIVITY:  Bed rest.  Pelvic rest.   DISCHARGE MEDICATIONS:  1. Resume Procardia.  2. Prenatal vitamins.   DISPOSITION:  The patient was to follow up in the office on December 22.  [name], M.D.    [name]  D:  05/28/2003  T:  05/28/2003  Job:  [id number]

## 2010-10-27 NOTE — Discharge Summary (Signed)
NAME:  [name], DONE                         ACCOUNT NO.:  [id number]   MEDICAL RECORD NO.:  [id number]                   PATIENT TYPE:  INP   LOCATION:  9139                                 [facility]   PHYSICIAN:  Charles A. Clearance Coots, M.D.             DATE OF BIRTH:  [birth year]   DATE OF ADMISSION:  07/23/2003  DATE OF DISCHARGE:  08/08/2003                                 DISCHARGE SUMMARY   ADMITTING DIAGNOSIS:  Intrauterine pregnancy at third trimester.  Rule out  preeclampsia.   DISCHARGE DIAGNOSES:  1. Intrauterine pregnancy at third trimester.  Rule out preeclampsia.  No     evidence of preeclampsia.  2. Increased uterine activity but not in labor.  3. Discharged home at 36 weeks' gestation, undelivered, in good condition.   REASON FOR ADMISSION:  A 25 year old obese white female with an estimated  date of confinement of September 05, 2003, admitted to [facility] after  being seen in the office with elevated blood pressures for approximately two  weeks.  The patient was admitted to rule out mild preeclampsia.  Recent  laboratory work prior to admission was remarkable for a uric acid of 5.7.  A  24-hour urine was remarkable for 160 mg of protein in 24 hours.  She denied  any signs and symptoms of preeclampsia.  In the office on the day of  admission, her blood pressures were 140s systolic to 70 to 90 diastolic.  Some of her problems and her risk factors for her antepartum course included  a history of asthma, obesity, rubella nonimmune, trichomoniasis, second  trimester abruption, preterm contractions, positive Chlamydia, and a history  of depression.   PRENATAL LABS:  Hemoglobin 12.3.  Blood type is A positive.  Antibody screen  is negative.  RPR is nonreactive.  Rubella nonimmune.  Hepatitis B surface  antigen negative.  HIV is nonreactive.  One-hour Glucola was 69.   PAST MEDICAL HISTORY:  Surgery:  None.  Illnesses:  Depression, asthma.   MEDICATIONS:  1.  Prenatal vitamins.  2. Albuterol.   SOCIAL HISTORY:  Single.  Negative tobacco, alcohol, or recreational drug  use.   FAMILY HISTORY:  Depression, diabetes, and hypertension.   PHYSICAL EXAMINATION:  GENERAL:  An obese white female in no acute distress.  VITAL SIGNS:  Weight 368 pounds, pulse 105, temperature 98, blood pressure  systolic 140s to 540J over diastolics of 60s to 90s.  LUNGS:  Clear to auscultation bilaterally.  HEART:  Regular rate and rhythm.  ABDOMEN:  Obese, gravid, nontender.  PELVIC:  On speculum exam, there is a white discharge noted.  Bimanual exam:  The cervix is long, closed, and posterior.   LABORATORY:  Urine is negative for protein.  Hemoglobin 10.7, hematocrit 32,  white blood cell count 10,300, platelets 281,000.  Comprehensive metabolic  panel was within normal limits except for a uric acid of 5.2.   HOSPITAL COURSE:  The patient was admitted and responded well to bed rest  with her blood pressures.  She continued to have increased uterine activity  during the course of her hospitalization but did not have any cervical  change.  Ultrasound obtained for growth during hospital stay was very  reassuring with normal amniotic fluid index of 15.4 and good interval growth  between 50th and 75th percentile.  After much discussion with the patient, a  decision was made to discharge home with labor instructions and the patient  agreed.   DISCHARGE LABORATORY VALUES:  Hemoglobin 11.2, hematocrit 33.4, white blood  cell count 12,100, platelets 289,000.   DISCHARGE DISPOSITION:  1. Medications.  Continue prenatal vitamins.  Tylenol as needed for pain as     directed.  2. Routine written instructions were given for antenatal obstetrical     discharge, undelivered.  3. The patient is to follow up in the office at [facility] in one     week.                                               Charles A. Clearance Coots, M.D.    CAH/MEDQ  D:  08/08/2003  T:   08/08/2003  Job:  [id number]
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
## 2010-10-27 NOTE — Discharge Summary (Signed)
NAME:  [name], [name]                         ACCOUNT NO.:  [id number]   MEDICAL RECORD NO.:  [id number]                   PATIENT TYPE:  INP   LOCATION:  9130                                 [facility]   PHYSICIAN:  Charles A. Clearance Coots, M.D.             DATE OF BIRTH:  [birth year]   DATE OF ADMISSION:  08/12/2003  DATE OF DISCHARGE:  08/26/2003                                 DISCHARGE SUMMARY   ADMITTING DIAGNOSES:  1. Thirty-seven weeks gestation.  2. Pregnancy-induced hypertension.   DISCHARGE DIAGNOSES:  1. Thirty-seven weeks gestation.  2. Pregnancy-induced hypertension.  3. Status post normal spontaneous vaginal delivery of a viable female on     August 24, 2003 at 1235; Apgars of 8 at one minute and 9 at 5 minutes.     Weight of 2835 g, length of 47 cm.  Mother and infant discharged home in     good condition.   REASON FOR ADMISSION:  A 25 year old G2 P0 obese white female with estimated  date of confinement of September 05, 2003 presented to the office for her  prenatal visit and on exam had elevated blood pressures.  The patient denied  headache, visual changes.  She was sent to triage at [facility] for  evaluation and continued to have increased blood pressure.  She was  therefore admitted for pregnancy-induced hypertension.   PAST MEDICAL HISTORY:  Surgery:  None.  Illnesses:  Asthma.   MEDICATIONS:  Prenatal vitamins, albuterol inhaler p.r.n.   ALLERGIES:  No known drug allergies.   SOCIAL HISTORY:  Single.  Negative tobacco, alcohol, or recreational drug  use.   PHYSICAL EXAMINATION:  VITAL SIGNS:  Afebrile, blood pressures 140 to 160s  systolic over 90 to 100s diastolic.  LUNGS:  Clear to auscultation bilaterally.  HEART:  Regular rate and rhythm.  ABDOMEN:  Obese, gravid, nontender.  PELVIC:  Omitted.   LABORATORY VALUES:  Hemoglobin 11.1; hematocrit 33.5; white blood cell count  9600; platelets 296,000.  Comprehensive metabolic panel was within  normal  limits.   HOSPITAL COURSE:  The patient was admitted and was placed on bedrest.  She  responded fairly well to bedrest but still had mildly-elevated blood  pressures of 120s to 140s over 70s to 90s for the first week of admission.  The patient had uterine activity on an irregular basis during the course of  her admission.  On hospital day #11 of her admission at approximately [redacted]  weeks gestation a decision was made to proceed with induction of labor.  The  patient was started on Pitocin and on hospital day #11 made no progress with  Pitocin and a decision was made to proceed with cervical ripening. On  hospital day #12 on examination of the cervix the patient had spontaneous  rupture of membranes with exam with expulsion of clear fluid.  Cervix was 2  cm dilated, 70% effaced, and the vertex  was at a -2 station.  Pitocin  induction was then continued per protocol and the patient progressed to a  precipitous normal spontaneous vaginal delivery on August 24, 2003 without  complications.  Postpartum course was uncomplicated and the patient was  discharged home on postpartum day #2 in good condition.   DISCHARGE LABORATORY VALUES:  Hemoglobin 9.8; hematocrit 28.4; white blood  cell count 7200; platelets 230,000.  Comprehensive metabolic panel was  within normal limits.   DISCHARGE DISPOSITION:  1. Medications:  Continue prenatal vitamins, ibuprofen 800 mg q.8h. as     needed for pain.  2. The patient is to follow up in the office in 1 week for a checkup of     blood pressure.  3. Depo-Provera was given for contraception prior to discharge.  4. Routine written instructions per booklet were given for discharge.                                               Charles A. Clearance Coots, M.D.    CAH/MEDQ  D:  10/14/2003  T:  10/14/2003  Job:  [id number]

## 2010-10-27 NOTE — Discharge Summary (Signed)
NAME:  [name], [name]                         ACCOUNT NO.:  [id number]   MEDICAL RECORD NO.:  [id number]                   PATIENT TYPE:  INP   LOCATION:  9157                                 [facility]   PHYSICIAN:  [name], M.D.         DATE OF BIRTH:  1985-09-16   DATE OF ADMISSION:  07/07/2003  DATE OF DISCHARGE:  07/09/2003                                 DISCHARGE SUMMARY   CHIEF COMPLAINT:  The patient is a 25 year old with an intrauterine  pregnancy at 31+ weeks with elevated blood pressures in the office, rule out  pregnancy induced hypertension.   Please see the dictated history and physical for further details.   HOSPITAL COURSE:  The patient was admitted.  Blood pressures on bed rest  were in the normotensive range.  Laboratory work was remarkable for only a  borderline uric acid of 5.4.  A 24 hour urine for protein and creatinine did  not demonstrate significant proteinuria. She was spilling 92 mg in the 24  hour period.  She was discharged to home on hospital day #2.   DISCHARGE DIAGNOSIS:  1. Intrauterine pregnancy 31+ weeks.  2. Pregnancy induced hypertension unlikely.   CONDITION:  Good.   DIET:  Regular.   ACTIVITY:  Modified bed rest.   MEDICATIONS:  Resume home medications.   DISPOSITION:  The patient is to followup in the office in several days.                                               [name], M.D.    [name]  D:  07/09/2003  T:  07/10/2003  Job:  [id number]

## 2010-10-27 NOTE — Discharge Summary (Signed)
   NAME:  [name], [name]                      ACCOUNT NO.:  [id number]   MEDICAL RECORD NO.:  [id number]                   PATIENT TYPE:  INP   LOCATION:  9158                                 [facility]ACILITY:  [facility]   PHYSICIAN:  Charles A. Clearance Coots, M.D.             DATE OF BIRTH:  Mar 03, 1986   DATE OF ADMISSION:  04/22/2003  DATE OF DISCHARGE:  04/26/2003                                 DISCHARGE SUMMARY   ADMITTING DIAGNOSES:  1. [redacted] weeks gestation.  2. Marginal placenta separation from abdominal trauma.   DISCHARGE DIAGNOSES:  1. [redacted] weeks gestation.  2. Marginal placenta separation from abdominal trauma.  3. Improved clinically.   DISPOSITION:  Discharged home in good condition undelivered at [redacted] weeks  gestation.   REASON FOR ADMISSION:  A 25 year old G2, P0 white female at approximately [redacted]  weeks gestation presents to the hospital readmission for abdominal pain.  The patient denies vaginal bleeding.   PAST SURGICAL HISTORY:  None.   PAST MEDICAL HISTORY:  None.   MEDICATIONS:  1. Prenatal vitamins.  2. Albuterol inhaler.  3. Procardia.   ALLERGIES:  No known drug allergies.   SOCIAL HISTORY:  Single.  Positive tobacco.  Negative alcohol or  recreational drug use.   FAMILY HISTORY:  Noncontributory.   PHYSICAL EXAMINATION:  GENERAL:  Well-nourished, well-developed, obese white  female in no acute distress.  VITAL SIGNS:  She is afebrile.  Vital signs are stable.  HEENT:  Normal.  LUNGS:  Clear to auscultation bilaterally.  HEART:  Regular rate and rhythm.  ABDOMEN:  Obese, soft, tender to deep palpation suprapubically.  PELVIC:  Cervical examination was omitted.   ADMITTING LABORATORY VALUES:  Hemoglobin 10.6, hematocrit 30.8, white blood  cell count 6400, platelets 219,000.   HOSPITAL COURSE:  The patient was admitted and started on IV magnesium  sulfate.  She responded well to magnesium sulfate tocolysis and after 48  hours was having no significant abdominal  pain.  The patient continued  without pain for the next 48 hours and was discharged home without pain on  hospital day #4.   DISCHARGE DISPOSITION:  Continue prenatal vitamins.  Routine antenatal  obstetrical discharge instructions were given for bleeding and pain.  The  patient is to call the office for follow-up appointment.                                               Charles A. Clearance Coots, M.D.    CAH/MEDQ  D:  04/26/2003  T:  04/26/2003  Job:  [id number]
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
## 2010-10-27 NOTE — H&P (Signed)
NAME:  [name], [name]                         ACCOUNT NO.:  [id number]   MEDICAL RECORD NO.:  [id number]                   PATIENT TYPE:  INP   LOCATION:  9159                                 [facility]ILITY[facility]   PHYSICIAN:  [name], M.D.         DATE OF BIRTH:  1985/12/13   DATE OF ADMISSION:  07/23/2003  DATE OF DISCHARGE:                                HISTORY & PHYSICAL   CHIEF COMPLAINT:  The patient is a 25 year old with an intrauterine  pregnancy at 33+ weeks with rule out mild preeclampsia.   HISTORY OF PRESENT ILLNESS:  The patient had been followed for borderline  elevated blood pressures over the past two weeks.  Recent laboratory work  was remarkable for a uric acid of 5.7.  A recent 24-hour urine was  remarkable for 160 mg of protein in 24 hours.  She denies today any  concomitant complaints.  In the office today her blood pressures are 140s-  140s/70s-90s.  Problems and risk factors for her antepartum course to date  include a history of asthma, obesity, rubella nonimmune, trichomoniasis,  second trimester abruption, preterm contractions, low-grade SIL Pap smear,  positive Chlamydia DNA probe, and a history of depression.   LABORATORY DATA:  Hemoglobin and hematocrit 12.3, 38.9.  Blood type A  positive.  Antibody screen negative.  RPR nonreactive.  Rubella nonimmune.  Hepatitis B surface antigen negative.  Her HIV is nonreactive. One-hour GTT  69.  Quad screen within normal limits.  Most recent ultrasound on July 06, 2003, appropriate growth.   ALLERGIES:  No known drug allergies.   MEDICATIONS:  Albuterol.   PAST MEDICAL HISTORY:  See above.   FAMILY HISTORY:  Depression, diabetes, hypertension.   PAST OBSTETRICAL AND GYNECOLOGIC HISTORY:  History of Chlamydia.   PHYSICAL EXAMINATION:  VITAL SIGNS:  Weight 368 pounds, pulse 105,  temperature 98, again blood pressures 140s-150s/60s-90s.  Urine negative for  protein.  Nonstress test reactive.  No  uterine contractions.  GENERAL:  Obese Caucasian female in no apparent distress.  ABDOMEN:  Obese, gravid, nontender.  PELVIC:  On speculum exam there is white discharge noted.  Bimanual exam:  The cervix is long, closed, and posterior.   ASSESSMENT:  Intrauterine pregnancy at 33+ weeks.  Rule out mild  preeclampsia.   PLAN:  Admission.  Repeat 24-hour urine for protein and creatinine and  laboratory work.  Continued surveillance in the hospital.                                               [name], M.D.    LAJ/MEDQ  D:  07/23/2003  T:  07/23/2003  Job:  [id number]

## 2010-10-27 NOTE — H&P (Signed)
NAME:  [name], [name]                         ACCOUNT NO.:  [id number]   MEDICAL RECORD NO.:  [id number]                   PATIENT TYPE:  INP   LOCATION:  9157                                 [facility]ILITY[facility]   PHYSICIAN:  [name], M.D.         DATE OF BIRTH:  [birth year]   DATE OF ADMISSION:  07/07/2003  DATE OF DISCHARGE:                                HISTORY & PHYSICAL   CHIEF COMPLAINT:  The patient is a 25 year old with an intrauterine  pregnancy at 31+ weeks with elevated blood pressures.   HISTORY OF PRESENT ILLNESS:  The patient returned for routine antenatal  visit. Blood pressures in the office today were systolic blood pressures in  the 150 to 160 range, diastolics in the 80s.  She denied any other  concomitant complaints.  Her antepartum course to date, problems, and risks  factors include history of asthma, obesity, rubella nonimmune,  trichomoniasis, second trimester abruption, preterm contractions,  posttraumatic Pap smear with low grade SIL, positive chlamydia, DNA probe,  and history of depression.  Recently recurrent nosebleeds and a recent  secondary evaluation by an ears, nose, and throat specialist.   LABORATORY WORK:  Hemoglobin and hematocrit 12.3 and 38.9, blood type A  positive, antibody screen negative. RPR nonreactive. Rubella nonimmune.  Hepatitis B surface antigen negative.  HIV nonreactive. One-hour GCT 69.  Quad screen within normal limits.  Most recent ultrasound July 06, 2003,  results are pending.   ALLERGIES:  No known drug allergies.   MEDICATIONS:  ALBUTEROL.   PAST MEDICAL HISTORY:  See above.   FAMILY HISTORY:  Depression, diabetes, and hypertension.   PAST OBSTETRIC/GYNECOLOGIC HISTORY:  History of chlamydia.   PHYSICAL EXAMINATION:  VITAL SIGNS: On physical exam, weight 365 pounds,  blood pressure 161/65, pulse 109, temperature 97.9.  GENERAL: Obese Caucasian female in no apparent distress.  ABDOMEN: Obese, gravid,  nontender. Fetal heart tones in the 140s.  PELVIC EXAM: Deferred.  EXTREMITIES: No edema.   Urinalysis revealed trace protein.   ASSESSMENT:  Intrauterine pregnancy at 31+ weeks with elevated blood  pressures.  Rule out pregnancy-induced hypertension.   PLAN:  Admission for 24-hour urine for protein and creatinine, laboratory  work. Review the most recent obstetrical ultrasound.                                               [name], M.D.    [name]  D:  07/07/2003  T:  07/07/2003  Job:  [id number]
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
## 2010-10-27 NOTE — Discharge Summary (Signed)
[name], [name]               ACCOUNT NO.:  [id number]   MEDICAL RECORD NO.:  [id number]         PATIENT TYPE:  WINP   LOCATION:                                [facility]   PHYSICIAN:  [name], M.D.DATE OF BIRTH:  04/02/1986   DATE OF ADMISSION:  05/10/2007  DATE OF DISCHARGE:  05/16/2007                               DISCHARGE SUMMARY   CHIEF COMPLAINT:  The patient is a 25 year old gravida 3, para 1 with an  estimated date of confinement of October 08, 2007, who complaints of right-  sided flank pain.   HISTORY OF PRESENT ILLNESS:  The patient had presented to an urgent care  with similar complaints.  She was diagnosed with a kidney stone.  The  patient does have a history of urolithiasis.  She denies fever or  chills.   She denies past medical history.  Please see the above.   MEDICATIONS:  Prenatal vitamins.   ALLERGIES:  DARVOCET and SULFA.   SOCIAL HISTORY:  She is single.  She denies any tobacco, ethanol, or  drug use.   PHYSICAL EXAMINATION:  VITAL SIGNS:  Stable, afebrile.  LUNGS:  Clear to auscultation bilaterally.  HEART:  Regular rate and rhythm.  ABDOMEN:  Nontender.  BACK:  Right-sided costovertebral angle tenderness appreciated.   Urinalysis:  Specific gravity 1.030, leukocyte esterase small, few  epithelial cells.  WBCs too numerous to count, many bacteria, 11-20  RBCs.   ASSESSMENT AND PLAN:  Intrauterine pregnancy at 18+ weeks, ascending  urinary tract infection.   PLAN:  Admission, parenteral antibiotics, and IV hydration.  The patient  was admitted and started on Rocephin.  A renal ultrasound showed showed  mild right pelvic caliectasis versus early hydronephrosis.  Urology was  consulted.  They agreed with the assessment of a stone and they agreed  with continued pain management and antibiotic coverage.  On May 11, 2008, she passed a stone.  Her pain and improved.  She was subsequently  discharged to home on May 15, 2008.   DISCHARGE DIAGNOSIS:  Urolithiasis rule out pyelonephritis.   CONDITION:  Stable.   DIET:  Regular.   ACTIVITY:  Modified bed rest.   MEDICATIONS:  Please see the medication reconciliation form.   DISPOSITION:  The patient was counseled to keep a previous appointment  in the office.      [name], M.D.  Electronically Signed     LAJ/MEDQ  D:  11/18/2007  T:  11/18/2007  Job:  [id number]
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
## 2010-10-27 NOTE — Discharge Summary (Signed)
NAMEMAILLE, HALLIWELL               ACCOUNT NO.:  [id number]   MEDICAL RECORD NO.:  [id number]          PATIENT TYPE:  MAT   LOCATION:  MATC                          [facility]ILITY[facility]  [facility]   PHYSICIAN:  [name], M.D.DATE OF BIRTH:  02-Apr-1986   DATE OF ADMISSION:  06/13/2007  DATE OF DISCHARGE:  06/13/2007                               DISCHARGE SUMMARY   __________ The patient is a 25 year old gravida __________ right-sided  __________ .   HOSPITAL COURSE:  The patient was admitted, started __________ .   DISCHARGE DIAGNOSES:  1. Urolithiasis.  2. Pyelonephritis.  3. Early second trimester __________ .   MEDICATIONS:  Percocet.   DISPOSITION:  The patient is to follow up in the office and as well with  __________ .      [name], M.D.     [name]  D:  06/15/2007  T:  06/15/2007  Job:  [id number]

## 2010-11-15 ENCOUNTER — Inpatient Hospital Stay (HOSPITAL_COMMUNITY)
Admission: AD | Admit: 2010-11-15 | Discharge: 2010-11-15 | Disposition: A | Discharge status: Home / Self Care | Payer: Medicaid Other | Source: Ambulatory Visit | Attending: Obstetrics & Gynecology | Admitting: Obstetrics & Gynecology

## 2010-11-15 DIAGNOSIS — N949 Unspecified condition associated with female genital organs and menstrual cycle: Secondary | ICD-10-CM | POA: Insufficient documentation

## 2010-11-15 DIAGNOSIS — N938 Other specified abnormal uterine and vaginal bleeding: Secondary | ICD-10-CM | POA: Insufficient documentation

## 2010-11-15 DIAGNOSIS — R109 Unspecified abdominal pain: Secondary | ICD-10-CM

## 2010-11-15 LAB — DIFFERENTIAL
Eosinophils Absolute: 0.1 10*3/uL (ref 0.0–0.7)
Eosinophils Relative: 1 % (ref 0–5)
Lymphocytes Relative: 25 % (ref 12–46)
Lymphs Abs: 2.7 10*3/uL (ref 0.7–4.0)
Monocytes Absolute: 0.6 10*3/uL (ref 0.1–1.0)
Monocytes Relative: 6 % (ref 3–12)

## 2010-11-15 LAB — URINALYSIS, ROUTINE W REFLEX MICROSCOPIC
Glucose, UA: NEGATIVE mg/dL
Ketones, ur: NEGATIVE mg/dL
Leukocytes, UA: NEGATIVE
Nitrite: NEGATIVE
Protein, ur: NEGATIVE mg/dL

## 2010-11-15 LAB — URINE MICROSCOPIC-ADD ON

## 2010-11-15 LAB — CBC
HCT: 34.6 % — ABNORMAL LOW (ref 36.0–46.0)
MCH: 23.9 pg — ABNORMAL LOW (ref 26.0–34.0)
MCHC: 31.2 g/dL (ref 30.0–36.0)
MCV: 76.7 fL — ABNORMAL LOW (ref 78.0–100.0)
Platelets: 296 10*3/uL (ref 150–400)
RDW: 17.1 % — ABNORMAL HIGH (ref 11.5–15.5)
WBC: 10.7 10*3/uL — ABNORMAL HIGH (ref 4.0–10.5)

## 2010-11-15 LAB — WET PREP, GENITAL: Trich, Wet Prep: NONE SEEN

## 2010-11-29 ENCOUNTER — Emergency Department (HOSPITAL_COMMUNITY)
Admission: EM | Admit: 2010-11-29 | Discharge: 2010-11-29 | Disposition: A | Discharge status: Home / Self Care | Payer: Medicaid Other | Attending: Emergency Medicine | Admitting: Emergency Medicine

## 2010-11-29 DIAGNOSIS — F3130 Bipolar disorder, current episode depressed, mild or moderate severity, unspecified: Secondary | ICD-10-CM | POA: Insufficient documentation

## 2010-11-29 DIAGNOSIS — R112 Nausea with vomiting, unspecified: Secondary | ICD-10-CM | POA: Insufficient documentation

## 2010-11-29 DIAGNOSIS — Z79899 Other long term (current) drug therapy: Secondary | ICD-10-CM | POA: Insufficient documentation

## 2010-11-29 DIAGNOSIS — R071 Chest pain on breathing: Secondary | ICD-10-CM | POA: Insufficient documentation

## 2010-11-29 DIAGNOSIS — R197 Diarrhea, unspecified: Secondary | ICD-10-CM | POA: Insufficient documentation

## 2010-11-29 LAB — URINALYSIS, ROUTINE W REFLEX MICROSCOPIC
Hgb urine dipstick: NEGATIVE
Nitrite: NEGATIVE
Specific Gravity, Urine: 1.029 (ref 1.005–1.030)
Urobilinogen, UA: 1 mg/dL (ref 0.0–1.0)

## 2010-11-29 LAB — POCT I-STAT, CHEM 8
BUN: 12 mg/dL (ref 6–23)
Creatinine, Ser: 0.7 mg/dL (ref 0.50–1.10)
Glucose, Bld: 114 mg/dL — ABNORMAL HIGH (ref 70–99)
Hemoglobin: 11.2 g/dL — ABNORMAL LOW (ref 12.0–15.0)
Sodium: 139 mEq/L (ref 135–145)
TCO2: 24 mmol/L (ref 0–100)

## 2010-11-29 LAB — POCT PREGNANCY, URINE: Preg Test, Ur: NEGATIVE

## 2010-11-29 LAB — URINE MICROSCOPIC-ADD ON

## 2010-11-30 ENCOUNTER — Other Ambulatory Visit: Payer: Self-pay | Admitting: Specialist

## 2010-11-30 DIAGNOSIS — M25561 Pain in right knee: Secondary | ICD-10-CM

## 2010-11-30 DIAGNOSIS — M25562 Pain in left knee: Secondary | ICD-10-CM

## 2010-12-03 ENCOUNTER — Other Ambulatory Visit: Payer: No Typology Code available for payment source

## 2010-12-03 ENCOUNTER — Emergency Department (HOSPITAL_COMMUNITY)
Admission: EM | Admit: 2010-12-03 | Discharge: 2010-12-03 | Disposition: A | Discharge status: Home / Self Care | Payer: Medicaid Other | Attending: Emergency Medicine | Admitting: Emergency Medicine

## 2010-12-03 DIAGNOSIS — R110 Nausea: Secondary | ICD-10-CM | POA: Insufficient documentation

## 2010-12-03 DIAGNOSIS — F319 Bipolar disorder, unspecified: Secondary | ICD-10-CM | POA: Insufficient documentation

## 2010-12-03 DIAGNOSIS — R197 Diarrhea, unspecified: Secondary | ICD-10-CM | POA: Insufficient documentation

## 2010-12-05 ENCOUNTER — Emergency Department (HOSPITAL_COMMUNITY)
Admission: EM | Admit: 2010-12-05 | Discharge: 2010-12-05 | Discharge status: Against Medical Advice | Payer: No Typology Code available for payment source | Attending: Emergency Medicine | Admitting: Emergency Medicine

## 2011-01-08 ENCOUNTER — Other Ambulatory Visit: Payer: Self-pay | Admitting: Specialist

## 2011-01-08 ENCOUNTER — Ambulatory Visit
Admission: RE | Admit: 2011-01-08 | Discharge: 2011-01-08 | Disposition: A | Discharge status: Home / Self Care | Payer: No Typology Code available for payment source | Source: Ambulatory Visit | Attending: Specialist | Admitting: Specialist

## 2011-01-08 DIAGNOSIS — M25569 Pain in unspecified knee: Secondary | ICD-10-CM

## 2011-02-27 ENCOUNTER — Inpatient Hospital Stay (HOSPITAL_COMMUNITY): Payer: Medicaid Other

## 2011-02-27 ENCOUNTER — Inpatient Hospital Stay (HOSPITAL_COMMUNITY)
Admission: AD | Admit: 2011-02-27 | Discharge: 2011-02-27 | Discharge status: Against Medical Advice | Payer: Medicaid Other | Source: Ambulatory Visit | Attending: Obstetrics & Gynecology | Admitting: Obstetrics & Gynecology

## 2011-02-27 ENCOUNTER — Encounter (HOSPITAL_COMMUNITY): Payer: Self-pay | Admitting: *Deleted

## 2011-02-27 DIAGNOSIS — R1031 Right lower quadrant pain: Secondary | ICD-10-CM | POA: Insufficient documentation

## 2011-02-27 DIAGNOSIS — M545 Low back pain, unspecified: Secondary | ICD-10-CM | POA: Insufficient documentation

## 2011-02-27 HISTORY — DX: Depression, unspecified: F32.A

## 2011-02-27 HISTORY — DX: Bipolar disorder, unspecified: F31.9

## 2011-02-27 HISTORY — DX: Major depressive disorder, single episode, unspecified: F32.9

## 2011-02-27 HISTORY — DX: Gestational (pregnancy-induced) hypertension without significant proteinuria, unspecified trimester: O13.9

## 2011-02-27 LAB — URINALYSIS, ROUTINE W REFLEX MICROSCOPIC
Bilirubin Urine: NEGATIVE
Glucose, UA: NEGATIVE mg/dL
Hgb urine dipstick: NEGATIVE
Ketones, ur: NEGATIVE mg/dL
Nitrite: NEGATIVE
Protein, ur: NEGATIVE mg/dL
Specific Gravity, Urine: >1.03 — ABNORMAL HIGH (ref 1.005–1.030)
Urobilinogen, UA: 0.2 mg/dL (ref 0.0–1.0)
pH: 6 (ref 5.0–8.0)

## 2011-02-27 LAB — URINE MICROSCOPIC-ADD ON

## 2011-02-27 LAB — POCT PREGNANCY, URINE: Preg Test, Ur: NEGATIVE

## 2011-02-27 MED ORDER — KETOROLAC TROMETHAMINE 60 MG/2ML IM SOLN: 60.0000 mg | Freq: Once | INTRAMUSCULAR | Status: DC

## 2011-02-27 NOTE — Progress Notes (Signed)
C/o lower back  That worsened over the past three hours.  Took pos preg test mon morning.

## 2011-02-27 NOTE — ED Provider Notes (Signed)
History   RLQ pain and LBP x 1 week  CSN: [id number] Arrival date & time: 02/27/2011  1:34 AM  (Include location/radiation/quality/duration/timing/severity/associated sxs/prior treatment) HPI  Past Medical History  Diagnosis Date  . Diabetes mellitus   . Pregnancy induced hypertension   . Depression   . Bipolar 1 disorder, depressed    Past Surgical History  Procedure Date  . Kidney stones   No family history on file.  History   Social History  . Marital Status: Single    Spouse Name: N/A    Number of Children: N/A  . Years of Education: N/A   Occupational History  . Not on file.   Social History Main Topics  . Smoking status: Not on file  . Smokeless tobacco: Not on file  . Alcohol Use:   . Drug Use:   . Sexually Active:    Other Topics Concern  . Not on file   Social History Narrative  . No narrative on file    History  Substance Use Topics  . Smoking status: Not on file  . Smokeless tobacco: Not on file  . Alcohol Use: Not on file    OB History    Grav Para Term Preterm Abortions TAB SAB Ect Mult Living   3 2 2  1  1   2       Review of Systems: Denies hematuria, dysuria, VB, discharge, fever, chills, N/V/D/C  Allergies   Allergies  Allergen Reactions  . Darvocet (Propoxyphene N-Acetaminophen) Hives and Nausea And Vomiting  . Sulfa Antibiotics Hives  . Tomato Hives     Home Medications   No current outpatient prescriptions on file as of 02/27/2011.     Physical Exam    BP 151/82  Pulse 83  Temp(Src) 98.2 F (36.8 C) (Oral)  Resp 18  Ht 5\' 7"  (1.702 m)  Wt 185.521 kg (409 lb)  BMI 64.06 kg/m2  Physical Exam: Pt left AMA prior to exam  ED Course  Procedures  Results for orders placed during the hospital encounter of 02/27/11  URINALYSIS, ROUTINE W REFLEX MICROSCOPIC      Component Value Range   Color, Urine YELLOW  YELLOW    Appearance CLEAR  CLEAR    Specific Gravity, Urine >1.030 (*) 1.005 - 1.030    pH 6.0  5.0 - 8.0      Glucose, UA NEGATIVE  NEGATIVE (mg/dL)   Hgb urine dipstick NEGATIVE  NEGATIVE    Bilirubin Urine NEGATIVE  NEGATIVE    Ketones, ur NEGATIVE  NEGATIVE (mg/dL)   Protein, ur NEGATIVE  NEGATIVE (mg/dL)   Urobilinogen, UA 0.2  0.0 - 1.0 (mg/dL)   Nitrite NEGATIVE  NEGATIVE    Leukocytes, UA TRACE (*) NEGATIVE   POCT PREGNANCY, URINE      Component Value Range   Preg Test, Ur NEGATIVE    URINE MICROSCOPIC-ADD ON      Component Value Range   Squamous Epithelial / LPF FEW (*) RARE    WBC, UA 3-6  <3 (WBC/hpf)   Bacteria, UA RARE  RARE    Crystals CA OXALATE CRYSTALS (*) NEGATIVE    Pt left AMA from to exam, [place] and Tx. In NAD.   Dorathy Kinsman 02/27/2011 4:48 AM

## 2011-02-27 NOTE — Progress Notes (Signed)
Pt waiting in lobby with friends for u/s, when rn goes out to call pt she and her friends are gone.

## 2011-02-28 LAB — URINALYSIS, ROUTINE W REFLEX MICROSCOPIC
Glucose, UA: NEGATIVE
Ketones, ur: NEGATIVE
Leukocytes, UA: NEGATIVE
Nitrite: NEGATIVE
Protein, ur: NEGATIVE

## 2011-02-28 LAB — URINE MICROSCOPIC-ADD ON

## 2011-03-01 LAB — COMPREHENSIVE METABOLIC PANEL
BUN: 6
CO2: 22
Chloride: 105
Creatinine, Ser: 0.63
GFR calc non Af Amer: >60
Glucose, Bld: 135 — ABNORMAL HIGH
Total Bilirubin: 0.3

## 2011-03-01 LAB — CBC
HCT: 30.5 — ABNORMAL LOW
Hemoglobin: 10.4 — ABNORMAL LOW
MCV: 81
Platelets: 300
RBC: 3.77 — ABNORMAL LOW
WBC: 10.4

## 2011-03-01 LAB — URINE CULTURE

## 2011-03-01 LAB — URINALYSIS, ROUTINE W REFLEX MICROSCOPIC
Nitrite: NEGATIVE
Nitrite: NEGATIVE
Protein, ur: NEGATIVE
Specific Gravity, Urine: 1.025
Specific Gravity, Urine: >1.03 — ABNORMAL HIGH
Urobilinogen, UA: 0.2
pH: 5.5

## 2011-03-01 LAB — URINE MICROSCOPIC-ADD ON

## 2011-03-02 LAB — URINE MICROSCOPIC-ADD ON

## 2011-03-02 LAB — URINALYSIS, ROUTINE W REFLEX MICROSCOPIC
Bilirubin Urine: NEGATIVE
Glucose, UA: NEGATIVE
Glucose, UA: NEGATIVE
Hgb urine dipstick: NEGATIVE
Ketones, ur: NEGATIVE
Ketones, ur: NEGATIVE
Nitrite: NEGATIVE
Protein, ur: NEGATIVE
Specific Gravity, Urine: 1.01
pH: 6
pH: 7

## 2011-03-05 LAB — URINALYSIS, ROUTINE W REFLEX MICROSCOPIC
Bilirubin Urine: NEGATIVE
Bilirubin Urine: NEGATIVE
Glucose, UA: NEGATIVE
Glucose, UA: NEGATIVE
Hgb urine dipstick: NEGATIVE
Protein, ur: NEGATIVE
Protein, ur: NEGATIVE
Specific Gravity, Urine: 1.015
Specific Gravity, Urine: 1.025
Specific Gravity, Urine: >1.03 — ABNORMAL HIGH
Urobilinogen, UA: 0.2
pH: 6
pH: 6
pH: 6

## 2011-03-05 LAB — CBC
HCT: 27.1 — ABNORMAL LOW
HCT: 30.9 — ABNORMAL LOW
Hemoglobin: 10.5 — ABNORMAL LOW
Hemoglobin: 9.3 — ABNORMAL LOW
MCHC: 34.2
MCHC: 33.8
MCV: 76 — ABNORMAL LOW
MCV: 74.6 — ABNORMAL LOW
MCV: 75.1 — ABNORMAL LOW
Platelets: 300
Platelets: 278
RBC: 4.15
RDW: 16.6 — ABNORMAL HIGH
RDW: 16.7 — ABNORMAL HIGH
WBC: 9.7
WBC: 12.2 — ABNORMAL HIGH
WBC: 8.1
WBC: 8.8

## 2011-03-05 LAB — URINE MICROSCOPIC-ADD ON

## 2011-03-05 LAB — COMPREHENSIVE METABOLIC PANEL
ALT: 12
ALT: 11
AST: 14
AST: 12
AST: 13
Albumin: 2.4 — ABNORMAL LOW
Albumin: 2.3 — ABNORMAL LOW
Alkaline Phosphatase: 85
BUN: 5 — ABNORMAL LOW
CO2: 21
Calcium: 8.6
Calcium: 9
Chloride: 104
Chloride: 104
Creatinine, Ser: 0.48
GFR calc Af Amer: >60
GFR calc Af Amer: >60
GFR calc Af Amer: >60
GFR calc non Af Amer: >60
Glucose, Bld: 106 — ABNORMAL HIGH
Potassium: 4.1
Sodium: 135
Sodium: 134 — ABNORMAL LOW
Total Bilirubin: 0.4
Total Bilirubin: 0.5
Total Protein: 6.4
Total Protein: 6.1

## 2011-03-05 LAB — URIC ACID: Uric Acid, Serum: 6.8

## 2011-03-05 LAB — URINE CULTURE: Colony Count: 40000

## 2011-03-05 NOTE — ED Provider Notes (Signed)
Agree with above note.  LEGGETT,KELLY H. 03/05/2011 9:26 AM

## 2011-03-07 LAB — CBC
HCT: 32.9 — ABNORMAL LOW
Hemoglobin: 10.9 — ABNORMAL LOW
MCV: 72.5 — ABNORMAL LOW
Platelets: 337
RDW: 17.6 — ABNORMAL HIGH

## 2011-03-07 LAB — URINALYSIS, ROUTINE W REFLEX MICROSCOPIC
Glucose, UA: NEGATIVE
Hgb urine dipstick: NEGATIVE
pH: 5.5

## 2011-03-07 LAB — URINE MICROSCOPIC-ADD ON

## 2011-03-07 LAB — POCT I-STAT, CHEM 8
BUN: 17
Creatinine, Ser: 0.7
Glucose, Bld: 116 — ABNORMAL HIGH
Hemoglobin: 11.6 — ABNORMAL LOW
Potassium: 4.1

## 2011-03-13 LAB — URINE MICROSCOPIC-ADD ON

## 2011-03-13 LAB — CBC
HCT: 33.8 — ABNORMAL LOW
Hemoglobin: 10.9 — ABNORMAL LOW
MCHC: 32.1
MCV: 72.3 — ABNORMAL LOW
RBC: 4.67
RDW: 18.1 — ABNORMAL HIGH

## 2011-03-13 LAB — POCT I-STAT, CHEM 8
Calcium, Ion: 1.14
Chloride: 106
Glucose, Bld: 185 — ABNORMAL HIGH
HCT: 35 — ABNORMAL LOW
TCO2: 25

## 2011-03-13 LAB — DIFFERENTIAL
Basophils Absolute: 0.1
Basophils Relative: 1
Eosinophils Absolute: 0.1
Eosinophils Relative: 1
Monocytes Absolute: 0.4
Monocytes Relative: 5
Neutro Abs: 5.9

## 2011-03-13 LAB — URINALYSIS, ROUTINE W REFLEX MICROSCOPIC
Bilirubin Urine: NEGATIVE
Glucose, UA: NEGATIVE
Specific Gravity, Urine: 1.024
Urobilinogen, UA: 0.2

## 2011-03-13 LAB — URINE CULTURE

## 2011-03-15 LAB — BASIC METABOLIC PANEL
BUN: 10 mg/dL (ref 6–23)
GFR calc Af Amer: >60 mL/min (ref >60)
GFR calc non Af Amer: >60 mL/min (ref >60)
Potassium: 3.8 mEq/L (ref 3.5–5.1)
Sodium: 138 mEq/L (ref 135–145)

## 2011-03-15 LAB — CBC
HCT: 36.5 % (ref 36.0–46.0)
Platelets: 294 10*3/uL (ref 150–400)
Platelets: 345 10*3/uL (ref 150–400)
RBC: 4.91 MIL/uL (ref 3.87–5.11)
RBC: 5.01 MIL/uL (ref 3.87–5.11)
WBC: 10.7 10*3/uL — ABNORMAL HIGH (ref 4.0–10.5)
WBC: 10.8 10*3/uL — ABNORMAL HIGH (ref 4.0–10.5)

## 2011-03-15 LAB — DIFFERENTIAL
Basophils Absolute: 0.1 10*3/uL (ref 0.0–0.1)
Basophils Relative: 1 % (ref 0–1)
Eosinophils Absolute: 0.1 10*3/uL (ref 0.0–0.7)
Eosinophils Relative: 1 % (ref 0–5)
Eosinophils Relative: 1 % (ref 0–5)
Lymphocytes Relative: 27 % (ref 12–46)
Lymphs Abs: 2.9 10*3/uL (ref 0.7–4.0)
Monocytes Absolute: 0.5 10*3/uL (ref 0.1–1.0)

## 2011-03-15 LAB — URINALYSIS, ROUTINE W REFLEX MICROSCOPIC
Bilirubin Urine: NEGATIVE
Bilirubin Urine: NEGATIVE
Ketones, ur: NEGATIVE mg/dL
Nitrite: NEGATIVE
Protein, ur: NEGATIVE mg/dL
Specific Gravity, Urine: 1.026 (ref 1.005–1.030)
Urobilinogen, UA: 0.2 mg/dL (ref 0.0–1.0)
Urobilinogen, UA: 0.2 mg/dL (ref 0.0–1.0)

## 2011-03-15 LAB — COMPREHENSIVE METABOLIC PANEL
ALT: 20 U/L (ref 0–35)
AST: 18 U/L (ref 0–37)
Albumin: 3.1 g/dL — ABNORMAL LOW (ref 3.5–5.2)
Alkaline Phosphatase: 64 U/L (ref 39–117)
CO2: 24 mEq/L (ref 19–32)
Chloride: 108 mEq/L (ref 96–112)
GFR calc Af Amer: >60 mL/min (ref >60)
GFR calc non Af Amer: >60 mL/min (ref >60)
Potassium: 3.6 mEq/L (ref 3.5–5.1)
Total Bilirubin: 0.5 mg/dL (ref 0.3–1.2)

## 2011-03-15 LAB — URINE MICROSCOPIC-ADD ON

## 2011-03-15 LAB — POCT PREGNANCY, URINE
Preg Test, Ur: NEGATIVE
Preg Test, Ur: NEGATIVE

## 2011-03-15 LAB — RAPID URINE DRUG SCREEN, HOSP PERFORMED
Amphetamines: NOT DETECTED
Barbiturates: NOT DETECTED
Benzodiazepines: NOT DETECTED
Opiates: NOT DETECTED

## 2011-03-15 LAB — WET PREP, GENITAL

## 2011-03-15 LAB — ETHANOL: Alcohol, Ethyl (B): <5 mg/dL (ref 0–10)

## 2011-03-16 LAB — URINALYSIS, ROUTINE W REFLEX MICROSCOPIC
Glucose, UA: NEGATIVE
pH: 5.5

## 2011-03-16 LAB — URINE MICROSCOPIC-ADD ON

## 2011-03-19 LAB — URINALYSIS, ROUTINE W REFLEX MICROSCOPIC
Bilirubin Urine: NEGATIVE
Bilirubin Urine: NEGATIVE
Glucose, UA: NEGATIVE
Hgb urine dipstick: NEGATIVE
Ketones, ur: NEGATIVE
Nitrite: NEGATIVE
Nitrite: NEGATIVE
Protein, ur: NEGATIVE
Specific Gravity, Urine: >1.03 — ABNORMAL HIGH
Urobilinogen, UA: 0.2
pH: 6

## 2011-03-19 LAB — CBC
MCHC: 34.7
MCV: 81.9
Platelets: 278
RBC: 3.53 — ABNORMAL LOW
RDW: 15.5

## 2011-03-19 LAB — COMPREHENSIVE METABOLIC PANEL
AST: 12
CO2: 23
Calcium: 8.1 — ABNORMAL LOW
Creatinine, Ser: 0.99
GFR calc Af Amer: >60
GFR calc non Af Amer: >60
Sodium: 133 — ABNORMAL LOW
Total Protein: 6.6

## 2011-03-19 LAB — URINE MICROSCOPIC-ADD ON

## 2011-03-20 LAB — URINALYSIS, ROUTINE W REFLEX MICROSCOPIC
Bilirubin Urine: NEGATIVE
Hgb urine dipstick: NEGATIVE
Nitrite: NEGATIVE
Protein, ur: 100 — AB
Specific Gravity, Urine: 1.025
Urobilinogen, UA: 0.2
Urobilinogen, UA: 1

## 2011-03-20 LAB — CBC
HCT: 38.9
Hemoglobin: 10.6 — ABNORMAL LOW
Hemoglobin: 12
Hemoglobin: 13.7
MCHC: 34
MCHC: 35
MCV: 80.5
MCV: 80.6
Platelets: 266
RBC: 3.75 — ABNORMAL LOW
RBC: 4.38
RBC: 4.83
RDW: 15.7 — ABNORMAL HIGH
WBC: 8.8

## 2011-03-20 LAB — COMPREHENSIVE METABOLIC PANEL
ALT: 14
AST: 13
Albumin: 2.6 — ABNORMAL LOW
Alkaline Phosphatase: 57
BUN: 9
BUN: 8
CO2: 26
Calcium: 9
Creatinine, Ser: 0.57
GFR calc Af Amer: >60
GFR calc Af Amer: >60
GFR calc non Af Amer: >60
Glucose, Bld: 100 — ABNORMAL HIGH
Potassium: 3.8
Sodium: 136
Total Protein: 6.3

## 2011-03-20 LAB — URINE CULTURE: Special Requests: POSITIVE

## 2011-03-20 LAB — DIFFERENTIAL
Basophils Relative: 0
Monocytes Absolute: 0.4
Monocytes Relative: 4
Neutro Abs: 6.4

## 2011-03-21 LAB — URINE MICROSCOPIC-ADD ON

## 2011-03-21 LAB — URINE CULTURE

## 2011-03-21 LAB — URINALYSIS, ROUTINE W REFLEX MICROSCOPIC
Glucose, UA: NEGATIVE
Ketones, ur: NEGATIVE
Protein, ur: NEGATIVE
pH: 6.5

## 2011-03-21 LAB — GC/CHLAMYDIA PROBE AMP, GENITAL: GC Probe Amp, Genital: NEGATIVE

## 2011-03-21 LAB — WET PREP, GENITAL: Trich, Wet Prep: NONE SEEN

## 2011-03-22 LAB — URINALYSIS, ROUTINE W REFLEX MICROSCOPIC
Bilirubin Urine: NEGATIVE
Ketones, ur: NEGATIVE
Nitrite: NEGATIVE
Protein, ur: NEGATIVE
Urobilinogen, UA: 0.2
pH: 5.5

## 2011-03-22 LAB — URINE MICROSCOPIC-ADD ON

## 2011-03-23 LAB — URINALYSIS, ROUTINE W REFLEX MICROSCOPIC
Bilirubin Urine: NEGATIVE
Bilirubin Urine: NEGATIVE
Glucose, UA: NEGATIVE
Glucose, UA: NEGATIVE
Ketones, ur: NEGATIVE
Specific Gravity, Urine: 1.03
pH: 6
pH: 6

## 2011-03-23 LAB — BASIC METABOLIC PANEL
CO2: 25
Calcium: 9
Creatinine, Ser: 0.68
GFR calc Af Amer: >60
GFR calc non Af Amer: >60

## 2011-03-23 LAB — POCT PREGNANCY, URINE
Operator id: 29459
Preg Test, Ur: NEGATIVE

## 2011-03-23 LAB — DIFFERENTIAL
Basophils Absolute: 0.1
Basophils Relative: 2 — ABNORMAL HIGH
Monocytes Relative: 7
Neutro Abs: 6
Neutrophils Relative %: 62

## 2011-03-23 LAB — ABO/RH: ABO/RH(D): A POS

## 2011-03-23 LAB — RPR: RPR Ser Ql: NONREACTIVE

## 2011-03-23 LAB — CBC
Hemoglobin: 12.4
MCHC: 33.2
RBC: 4.68
RBC: 4.69

## 2011-03-23 LAB — URINE CULTURE

## 2011-03-23 LAB — HCG, SERUM, QUALITATIVE: Preg, Serum: POSITIVE — AB

## 2011-03-23 LAB — URINE MICROSCOPIC-ADD ON

## 2011-03-26 LAB — URINE CULTURE
Colony Count: 30000
Colony Count: >=100000

## 2011-03-26 LAB — URINALYSIS, ROUTINE W REFLEX MICROSCOPIC
Bilirubin Urine: NEGATIVE
Bilirubin Urine: NEGATIVE
Glucose, UA: NEGATIVE
Ketones, ur: NEGATIVE
Nitrite: NEGATIVE
Nitrite: NEGATIVE
Nitrite: NEGATIVE
Protein, ur: 100 — AB
Protein, ur: 30 — AB
Protein, ur: NEGATIVE
Specific Gravity, Urine: 1.02
Specific Gravity, Urine: 1.02
Specific Gravity, Urine: 1.024
Urobilinogen, UA: 0.2
Urobilinogen, UA: 0.2
pH: 6
pH: 6

## 2011-03-26 LAB — CBC
HCT: 35.5 — ABNORMAL LOW
MCV: 76.4 — ABNORMAL LOW
Platelets: 347
Platelets: 360
RBC: 4.9
RDW: 16.5 — ABNORMAL HIGH
WBC: 10.3

## 2011-03-26 LAB — URINE MICROSCOPIC-ADD ON

## 2011-03-26 LAB — DIFFERENTIAL
Basophils Absolute: 0
Lymphocytes Relative: 23
Lymphocytes Relative: 24
Lymphs Abs: 2.4
Lymphs Abs: 2.9
Monocytes Absolute: 0.7
Monocytes Relative: 5
Monocytes Relative: 6
Neutro Abs: 7.2
Neutro Abs: 8.6 — ABNORMAL HIGH
Neutrophils Relative %: 70

## 2011-03-26 LAB — PROTIME-INR
INR: 0.9
Prothrombin Time: 12.5

## 2011-03-26 LAB — BASIC METABOLIC PANEL
BUN: 13
Calcium: 8.8
Chloride: 107
Creatinine, Ser: 0.71
GFR calc Af Amer: >60
GFR calc non Af Amer: >60

## 2011-03-26 LAB — COMPREHENSIVE METABOLIC PANEL
Albumin: 3.3 — ABNORMAL LOW
BUN: 6
Creatinine, Ser: 0.73
Total Protein: 7.9

## 2011-03-26 LAB — PREGNANCY, URINE
Preg Test, Ur: NEGATIVE
Preg Test, Ur: NEGATIVE
Preg Test, Ur: NEGATIVE

## 2011-03-26 LAB — APTT: aPTT: 32

## 2011-03-28 LAB — PREGNANCY, URINE: Preg Test, Ur: NEGATIVE

## 2011-03-28 LAB — CBC
HCT: 33.4 — ABNORMAL LOW
MCV: 74.3 — ABNORMAL LOW
Platelets: 289
WBC: 13.2 — ABNORMAL HIGH

## 2011-03-28 LAB — BASIC METABOLIC PANEL
BUN: 10
Chloride: 104
Glucose, Bld: 96
Potassium: 3.8

## 2011-03-28 LAB — DIFFERENTIAL
Eosinophils Absolute: 0.1
Eosinophils Relative: 1
Lymphs Abs: 2.2

## 2011-05-25 ENCOUNTER — Emergency Department (HOSPITAL_COMMUNITY)
Admission: EM | Admit: 2011-05-25 | Discharge: 2011-05-25 | Disposition: A | Discharge status: Home / Self Care | Payer: Medicaid Other | Attending: Emergency Medicine | Admitting: Emergency Medicine

## 2011-05-25 ENCOUNTER — Emergency Department (HOSPITAL_COMMUNITY): Payer: Medicaid Other

## 2011-05-25 ENCOUNTER — Encounter (HOSPITAL_COMMUNITY): Payer: Self-pay

## 2011-05-25 DIAGNOSIS — N390 Urinary tract infection, site not specified: Secondary | ICD-10-CM | POA: Insufficient documentation

## 2011-05-25 DIAGNOSIS — N898 Other specified noninflammatory disorders of vagina: Secondary | ICD-10-CM | POA: Insufficient documentation

## 2011-05-25 DIAGNOSIS — R51 Headache: Secondary | ICD-10-CM | POA: Insufficient documentation

## 2011-05-25 DIAGNOSIS — R112 Nausea with vomiting, unspecified: Secondary | ICD-10-CM | POA: Insufficient documentation

## 2011-05-25 DIAGNOSIS — M79609 Pain in unspecified limb: Secondary | ICD-10-CM | POA: Insufficient documentation

## 2011-05-25 DIAGNOSIS — M549 Dorsalgia, unspecified: Secondary | ICD-10-CM | POA: Insufficient documentation

## 2011-05-25 DIAGNOSIS — R509 Fever, unspecified: Secondary | ICD-10-CM | POA: Insufficient documentation

## 2011-05-25 HISTORY — DX: Calculus of kidney: N20.0

## 2011-05-25 HISTORY — DX: Urinary tract infection, site not specified: N39.0

## 2011-05-25 HISTORY — DX: Post-traumatic stress disorder, unspecified: F43.10

## 2011-05-25 LAB — URINALYSIS, ROUTINE W REFLEX MICROSCOPIC
Bilirubin Urine: NEGATIVE
Specific Gravity, Urine: 1.02 (ref 1.005–1.030)
Urobilinogen, UA: 1 mg/dL (ref 0.0–1.0)
pH: 5.5 (ref 5.0–8.0)

## 2011-05-25 LAB — CBC
HCT: 36.9 % (ref 36.0–46.0)
Hemoglobin: 11.6 g/dL — ABNORMAL LOW (ref 12.0–15.0)
MCV: 76.9 fL — ABNORMAL LOW (ref 78.0–100.0)
RBC: 4.8 MIL/uL (ref 3.87–5.11)
WBC: 10.9 10*3/uL — ABNORMAL HIGH (ref 4.0–10.5)

## 2011-05-25 LAB — DIFFERENTIAL
Eosinophils Relative: 2 % (ref 0–5)
Lymphocytes Relative: 31 % (ref 12–46)
Lymphs Abs: 3.4 10*3/uL (ref 0.7–4.0)
Monocytes Absolute: 0.5 10*3/uL (ref 0.1–1.0)
Monocytes Relative: 5 % (ref 3–12)

## 2011-05-25 LAB — URINE MICROSCOPIC-ADD ON

## 2011-05-25 LAB — BASIC METABOLIC PANEL
CO2: 25 mEq/L (ref 19–32)
Calcium: 9.3 mg/dL (ref 8.4–10.5)
Creatinine, Ser: 0.68 mg/dL (ref 0.50–1.10)
Glucose, Bld: 80 mg/dL (ref 70–99)

## 2011-05-25 MED ORDER — NITROFURANTOIN MONOHYD MACRO 100 MG PO CAPS: 100.0000 mg | ORAL_CAPSULE | Freq: Two times a day (BID) | ORAL | Status: AC

## 2011-05-25 MED ORDER — MORPHINE SULFATE 4 MG/ML IJ SOLN: 4.0000 mg | Freq: Once | INTRAMUSCULAR | Status: DC

## 2011-05-25 MED ORDER — OXYCODONE-ACETAMINOPHEN 5-325 MG PO TABS
1.0000 | ORAL_TABLET | Freq: Once | ORAL | Status: AC
  Administered 2011-05-25: 1 via ORAL
  Filled 2011-05-25: qty 1

## 2011-05-25 MED ORDER — NITROFURANTOIN MONOHYD MACRO 100 MG PO CAPS
100.0000 mg | ORAL_CAPSULE | ORAL | Status: AC
  Administered 2011-05-25: 100 mg via ORAL
  Filled 2011-05-25: qty 1

## 2011-05-25 MED ORDER — MORPHINE SULFATE 2 MG/ML IJ SOLN
INTRAMUSCULAR | Status: AC
  Administered 2011-05-25: 4 mg via INTRAVENOUS
  Filled 2011-05-25: qty 2

## 2011-05-25 MED ORDER — IBUPROFEN 800 MG PO TABS: 800.0000 mg | ORAL_TABLET | Freq: Three times a day (TID) | ORAL | Status: AC

## 2011-05-25 MED ORDER — ONDANSETRON HCL 4 MG/2ML IJ SOLN
4.0000 mg | Freq: Once | INTRAMUSCULAR | Status: AC
  Administered 2011-05-25: 4 mg via INTRAVENOUS
  Filled 2011-05-25: qty 2

## 2011-05-25 MED ORDER — NITROFURANTOIN MONOHYD MACRO 100 MG PO CAPS: 100.0000 mg | ORAL_CAPSULE | Freq: Two times a day (BID) | ORAL | Status: DC

## 2011-05-25 NOTE — ED Notes (Signed)
Pt explained she would be moved into a private room to further care

## 2011-05-25 NOTE — ED Notes (Signed)
Pt woke up with sharp pain in lower back and upper thigh started yesterday at 0400 in the morning, got up and had a gush of blood and clot coming out of her vagina.  No bleeding at this moment. Hasn't have period for 5 month, repot hx of irregular cycle. Also report migraine started yesterday with nausea and vomiting.

## 2011-05-25 NOTE — ED Provider Notes (Signed)
History     CSN: [id number] Arrival date & time: 05/25/2011  3:27 PM   First MD Initiated Contact with Patient 05/25/11 1731      Chief Complaint  Patient presents with  . Leg Pain  . Back Pain  . Vaginal Bleeding    (Consider location/radiation/quality/duration/timing/severity/associated sxs/prior treatment) HPI History is obtained from the patient. She presents with several complaints. States that she awoke yesterday morning at 4 AM with a sudden, sharp pain in her lower back, seemed to radiate into her bilateral legs. It is described as a tingly/numb sensation which goes to just below her knees. She again had this pain this morning, and has had it throughout the day today. She is able to walk. Denies saddle anesthesia, bowel incontinence, urinary retention. She denies IVDU. She has never had anything like this before, and denies any recent injury to her back.  She additionally states that she noted a "gush" of blood and clots from her vagina yesterday morning. She had a small amount of additional bleeding during the day yesterday but has had none since. She has a hx of irregular menses since her last pregnancy.  Finally, she states that she began to have a migraine which started yesterday; this has been accompanied by nausea/vomiting. She had some subjective fever at home this AM.  Past Medical History  Diagnosis Date  . Depression   . Bipolar 1 disorder, depressed   . PTSD (post-traumatic stress disorder)   . Kidney stone   . Migraine   . Urinary tract infection     Past Surgical History  Procedure Date  . Kidney stones   . Lipotripsy     No family history on file.  History  Substance Use Topics  . Smoking status: Never Smoker   . Smokeless tobacco: Not on file  . Alcohol Use: No    OB History    Grav Para Term Preterm Abortions TAB SAB Ect Mult Living   3 2 2  1  1   2       Review of Systems  Constitutional: Positive for fever. Negative for activity  change, appetite change and unexpected weight change.  HENT: Negative for congestion, facial swelling, rhinorrhea, neck stiffness, voice change and tinnitus.   Eyes: Negative for discharge and visual disturbance.  Respiratory: Negative for chest tightness and shortness of breath.   Cardiovascular: Negative for chest pain and palpitations.  Gastrointestinal: Positive for nausea and vomiting. Negative for abdominal pain, diarrhea and constipation.  Genitourinary: Positive for vaginal bleeding. Negative for dysuria, frequency, flank pain, vaginal discharge, enuresis, vaginal pain and pelvic pain.  Neurological: Positive for headaches. Negative for dizziness, weakness and light-headedness.    Allergies  Darvocet; Sulfa antibiotics; and Tomato  Home Medications   Current Outpatient Rx  Name Route Sig Dispense Refill  . ARIPIPRAZOLE 20 MG PO TABS Oral Take 20 mg by mouth daily.      Marland Kitchen CLONAZEPAM 1 MG PO TABS Oral Take 1 mg by mouth 3 (three) times daily as needed. For anxiety     . IBUPROFEN 800 MG PO TABS Oral Take 800 mg by mouth every 8 (eight) hours as needed. For pain.     . VENLAFAXINE HCL 75 MG PO CP24 Oral Take 75 mg by mouth 2 (two) times daily.        BP 155/82  Pulse 94  Temp(Src) 98.2 F (36.8 C) (Oral)  Resp 16  SpO2 100%  Physical Exam  Nursing note and vitals  reviewed. Constitutional: She is oriented to person, place, and time. She appears well-developed and well-nourished. No distress.       Morbidly obese  HENT:  Head: Normocephalic and atraumatic.  Right Ear: External ear normal.  Left Ear: External ear normal.  Nose: Nose normal.  Eyes: Conjunctivae are normal. Pupils are equal, round, and reactive to light.  Neck: Normal range of motion. Neck supple.       Negative meningeal signs  Cardiovascular: Normal rate, regular rhythm and normal heart sounds.   Pulmonary/Chest: Effort normal and breath sounds normal. No respiratory distress. She has no wheezes.    Abdominal: Soft. Bowel sounds are normal. There is no tenderness. There is no rebound and no guarding.  Musculoskeletal: Normal range of motion.  Neurological: She is alert and oriented to person, place, and time. She has normal strength. No cranial nerve deficit. She exhibits normal muscle tone. Coordination and gait normal.  Reflex Scores:      Patellar reflexes are 1+ on the right side and 1+ on the left side.      Achilles reflexes are 1+ on the right side and 1+ on the left side.      Sensory intact to lt touch in LEs bilaterally. DP pulses intact.  Skin: Skin is dry. No rash noted. She is not diaphoretic.  Psychiatric: Her mood appears anxious.    ED Course  Procedures (including critical care time)  Labs Reviewed  URINALYSIS, ROUTINE W REFLEX MICROSCOPIC - Abnormal; Notable for the following:    Color, Urine AMBER (*) BIOCHEMICALS MAY BE AFFECTED BY COLOR   APPearance CLOUDY (*)    Hgb urine dipstick LARGE (*)    Protein, ur 30 (*)    Nitrite POSITIVE (*)    Leukocytes, UA SMALL (*)    All other components within normal limits  CBC - Abnormal; Notable for the following:    WBC 10.9 (*)    Hemoglobin 11.6 (*)    MCV 76.9 (*)    MCH 24.2 (*)    RDW 17.2 (*)    All other components within normal limits  URINE MICROSCOPIC-ADD ON - Abnormal; Notable for the following:    Squamous Epithelial / LPF FEW (*)    Bacteria, UA MANY (*)    All other components within normal limits  POCT PREGNANCY, URINE  DIFFERENTIAL  POCT PREGNANCY, URINE  BASIC METABOLIC PANEL   Dg Lumbar Spine Complete  05/25/2011  *RADIOLOGY REPORT*  Clinical Data: Back pain.  LUMBAR SPINE - COMPLETE 4+ VIEW  Comparison: None  Findings: The lateral film demonstrates normal alignment. Vertebral bodies and disc spaces are maintained.  No acute bony findings.  Normal alignment of the facet joints and no pars defects.  The visualized bony pelvis in intact.  IMPRESSION: Normal alignment and no acute bony findings.   Original Report Authenticated By: P. Loralie Champagne, M.D.     1. Urinary tract infection       MDM  6:53 PM Pt seen and assessed. No neuro deficits appreciated on exam. Will assess further with imaging/labs.  9:08 PM UA nitrite and leukocyte +. WBC is 10.9 which is consistent with previous values. Hemoglobin and hematocrit is also consistent with previous values. Xray nl. Await BMP. She was given initial dose of Macrobid.  9:21 PM Electrolytes are unremarkable. We'll plan to discharge the patient home with a prescription for Macrobid. Findings and plan discussed with patient. Reasons to return to the ED were also discussed. She is to  make a followup appointment with her primary care provider next week to get rechecked. Patient verbalized understanding and agreed to plan.        Grant Fontana, [place] 05/25/11 2131

## 2011-05-25 NOTE — ED Provider Notes (Signed)
Medical screening examination/treatment/procedure(s) were performed by non-physician practitioner and as supervising physician I was immediately available for consultation/collaboration.  Ahjanae Cassel M Tannya Gonet, MD 05/25/11 2219 

## 2011-05-28 LAB — URINE CULTURE

## 2011-05-29 NOTE — ED Notes (Signed)
+   Urine Patient treated with macrobid-sensitive to same-chart appended per protocol MD. 

## 2011-10-17 DIAGNOSIS — F330 Major depressive disorder, recurrent, mild: Secondary | ICD-10-CM

## 2011-10-17 DIAGNOSIS — F411 Generalized anxiety disorder: Secondary | ICD-10-CM

## 2011-11-14 ENCOUNTER — Encounter (HOSPITAL_COMMUNITY): Payer: Self-pay

## 2011-11-14 ENCOUNTER — Emergency Department (HOSPITAL_COMMUNITY)
Admission: EM | Admit: 2011-11-14 | Discharge: 2011-11-14 | Disposition: A | Discharge status: Home / Self Care | Payer: Medicaid - Out of State | Attending: Emergency Medicine | Admitting: Emergency Medicine

## 2011-11-14 ENCOUNTER — Emergency Department (HOSPITAL_COMMUNITY): Payer: Medicaid - Out of State

## 2011-11-14 DIAGNOSIS — R599 Enlarged lymph nodes, unspecified: Secondary | ICD-10-CM | POA: Insufficient documentation

## 2011-11-14 DIAGNOSIS — R509 Fever, unspecified: Secondary | ICD-10-CM | POA: Insufficient documentation

## 2011-11-14 DIAGNOSIS — R51 Headache: Secondary | ICD-10-CM | POA: Insufficient documentation

## 2011-11-14 DIAGNOSIS — A5901 Trichomonal vulvovaginitis: Secondary | ICD-10-CM | POA: Insufficient documentation

## 2011-11-14 DIAGNOSIS — J020 Streptococcal pharyngitis: Secondary | ICD-10-CM | POA: Insufficient documentation

## 2011-11-14 DIAGNOSIS — R000 Tachycardia, unspecified: Secondary | ICD-10-CM | POA: Insufficient documentation

## 2011-11-14 LAB — POCT I-STAT, CHEM 8
Calcium, Ion: 1.09 mmol/L — ABNORMAL LOW (ref 1.12–1.32)
Glucose, Bld: 93 mg/dL (ref 70–99)
HCT: 39 % (ref 36.0–46.0)
Hemoglobin: 13.3 g/dL (ref 12.0–15.0)
Potassium: 3.4 mEq/L — ABNORMAL LOW (ref 3.5–5.1)
TCO2: 27 mmol/L (ref 0–100)

## 2011-11-14 LAB — URINALYSIS, ROUTINE W REFLEX MICROSCOPIC
Glucose, UA: NEGATIVE mg/dL
Nitrite: NEGATIVE
Protein, ur: 100 mg/dL — AB
pH: 6.5 (ref 5.0–8.0)

## 2011-11-14 LAB — DIFFERENTIAL
Basophils Relative: 0 % (ref 0–1)
Eosinophils Absolute: 0 10*3/uL (ref 0.0–0.7)
Eosinophils Relative: 0 % (ref 0–5)
Lymphocytes Relative: 14 % (ref 12–46)
Neutrophils Relative %: 79 % — ABNORMAL HIGH (ref 43–77)

## 2011-11-14 LAB — WET PREP, GENITAL: Yeast Wet Prep HPF POC: NONE SEEN

## 2011-11-14 LAB — POCT PREGNANCY, URINE: Preg Test, Ur: NEGATIVE

## 2011-11-14 LAB — CBC
Hemoglobin: 11.6 g/dL — ABNORMAL LOW (ref 12.0–15.0)
RBC: 5.03 MIL/uL (ref 3.87–5.11)

## 2011-11-14 LAB — URINE MICROSCOPIC-ADD ON

## 2011-11-14 MED ORDER — METRONIDAZOLE 500 MG PO TABS
2000.0000 mg | ORAL_TABLET | Freq: Once | ORAL | Status: AC
  Administered 2011-11-14: 2000 mg via ORAL
  Filled 2011-11-14: qty 4

## 2011-11-14 MED ORDER — OXYCODONE-ACETAMINOPHEN 5-325 MG PO TABS: 1.0000 | ORAL_TABLET | Freq: Four times a day (QID) | ORAL | Status: AC | PRN

## 2011-11-14 MED ORDER — ONDANSETRON HCL 4 MG/2ML IJ SOLN
4.0000 mg | INTRAMUSCULAR | Status: AC | PRN
  Administered 2011-11-14 (×2): 4 mg via INTRAVENOUS
  Filled 2011-11-14 (×2): qty 2

## 2011-11-14 MED ORDER — MORPHINE SULFATE 4 MG/ML IJ SOLN
4.0000 mg | INTRAMUSCULAR | Status: AC | PRN
Start: 2011-11-14 — End: 2011-11-14
  Administered 2011-11-14 (×2): 4 mg via INTRAVENOUS
  Filled 2011-11-14 (×2): qty 1

## 2011-11-14 MED ORDER — AZITHROMYCIN 250 MG PO TABS
1000.0000 mg | ORAL_TABLET | Freq: Once | ORAL | Status: AC
  Administered 2011-11-14: 1000 mg via ORAL
  Filled 2011-11-14: qty 4

## 2011-11-14 MED ORDER — DEXTROSE 5 % IV SOLN
1.0000 g | Freq: Once | INTRAVENOUS | Status: AC
  Administered 2011-11-14: 1 g via INTRAVENOUS
  Filled 2011-11-14: qty 10

## 2011-11-14 MED ORDER — IOHEXOL 300 MG/ML  SOLN
100.0000 mL | Freq: Once | INTRAMUSCULAR | Status: AC | PRN
  Administered 2011-11-14: 100 mL via INTRAVENOUS

## 2011-11-14 MED ORDER — SODIUM CHLORIDE 0.9 % IV BOLUS (SEPSIS)
1000.0000 mL | Freq: Once | INTRAVENOUS | Status: AC
  Administered 2011-11-14: 1000 mL via INTRAVENOUS

## 2011-11-14 MED ORDER — DEXAMETHASONE SODIUM PHOSPHATE 10 MG/ML IJ SOLN
10.0000 mg | Freq: Once | INTRAMUSCULAR | Status: AC
  Administered 2011-11-14: 10 mg via INTRAVENOUS
  Filled 2011-11-14: qty 1

## 2011-11-14 NOTE — ED Provider Notes (Signed)
Medical screening examination/treatment/procedure(s) were performed by non-physician practitioner and as supervising physician I was immediately available for consultation/collaboration.   Shell Blanchette, MD 11/14/11 2230 

## 2011-11-14 NOTE — ED Notes (Signed)
Patient reports that she has a sore throat (difficulty swallowing) and left ear pain that began 3 days ago. Patient reports that her temp at home was 103.2 at 1200 today and took Tylenol prior to coming to the ED.  Patient also has a productive cough with yellow/green sputum.

## 2011-11-14 NOTE — Discharge Instructions (Signed)
You have been treated in the emergency department for an infection, possibly sexually transmitted. Results of your gonorrhea and chlamydia tests are pending and you will be notified if they are positive. It is very important to practice safe sex and use condoms when sexually active. If your results are positive you need to notify all sexual partners so they can be treated as well. The website http://www.dontspreadit.com/ can be used to send anonymous text messages or emails to alert sexual contacts. Follow up with your doctor, or OBGYN in regards to today's visit.   ° °Gonorrhea and Chlamydia °SYMPTOMS  °In females, symptoms may go unnoticed. Symptoms that are more noticeable can include:  °Belly (abdominal) pain.  °Painful intercourse.  °Watery mucous-like discharge from the vagina.  °Miscarriage.  °Discomfort when urinating.  °Inflammation of the rectum.  °Abnormal gray-green frothy vaginal discharge  °Vaginal itching and irritatio  °Itching and irritation of the area outside the vagina.   °Painful urination.  °Bleeding after sexual intercourse.  °In males, symptoms include:  °Burning with urination.  °Pain in the testicles.  °Watery mucous-like discharge from the penis.  °It can cause longstanding (chronic) pelvic pain after frequent infections.  °TREATMENT  °PID can cause women to not be able to have children (sterile) if left untreated or if half-treated.  It is important to finish ALL medications given to you.  °This is a sexually transmitted infection. So you are also at risk for other sexually transmitted diseases, including HIV (AIDS), it is recommended that you get tested. °HOME CARE INSTRUCTIONS  °Warning: This infection is contagious. Do not have sex until treatment is completed. Follow up at your caregiver's office or the clinic to which you were referred. If your diagnosis (learning what is wrong) is confirmed by culture or some other method, your recent sexual contacts need treatment. Even if they are  symptom free or have a negative culture or evaluation, they should be treated.  °PREVENTION  °Women should use sanitary pads instead of tampons for vaginal discharge.  °Wipe front to back after using the toilet and avoid douching.   °Practice safe sex, use condoms, have only one sex partner and be sure your sex partner is not having sex with others.  °Ask your caregiver to test you for chlamydia at your regular checkups or sooner if you are having symptoms.  °Ask for further information if you are pregnant.  °SEEK IMMEDIATE MEDICAL CARE IF:  °You develop an oral temperature above 102° F (38.9° C), not controlled by medications or lasting more than 2 days.  °You develop an increase in pain.  °You develop any type of abnormal discharge.  °You develop vaginal bleeding and it is not time for your period.  °You develop painful intercourse.  ° °Bacterial Vaginosis  °Bacterial vaginosis (BV) is a vaginal infection where the normal balance of bacteria in the vagina is disrupted. This is not a sexually transmitted disease and your sexual partners do NOT need to be treated. °CAUSES  °The cause of BV is not fully understood. BV develops when there is an increase or imbalance of harmful bacteria.  °Some activities or behaviors can upset the normal balance of bacteria in the vagina and put women at increased risk including:  °Having a new sex partner or multiple sex partners.  °Douching.  °Using an intrauterine device (IUD) for contraception.  °It is not clear what role sexual activity plays in the development of BV. However, women that have never had sexual intercourse are rarely   infected with BV.  °Women do not get BV from toilet seats, bedding, swimming pools or from touching objects around them.  ° °SYMPTOMS  °Grey vaginal discharge.  °A fish-like odor with discharge, especially after sexual intercourse.  °Itching or burning of the vagina and vulva.  °Burning or pain with urination.  °Some women have no signs or symptoms at  all.  ° °TREATMENT  °Sometimes BV will clear up without treatment.  °BV may be treated with antibiotics.  °BV can recur after treatment. If this happens, a second round of antibiotics will often be prescribed.  °HOME CARE INSTRUCTIONS  °Finish all medication as directed by your caregiver.  °Do not have sex until treatment is completed.  °Do NOT drink any alcoholic beverages while being treated  with Metronidazole (Flagyl). This will cause a severe reaction inducing vomiting. ° °RESOURCE GUIDE ° °Dental Problems ° °Patients with Medicaid: °[facility]                     [facility] °[address]                                           [address] °Phone:  [phone]                                                  Phone:  [phone] ° °If unable to pay or uninsured, contact:  [facility] or [facility] to become qualified for the adult dental clinic. ° °Chronic Pain Problems °Contact [facility]  [phone] °Patients need to be referred by their primary care doctor. ° °Insufficient Money for Medicine °Contact [employer]:  call "211" or [facility] 271-5999. ° °No Primary Care Doctor °Call [facility]  832-8000 °Other agencies that provide inexpensive medical care °   [facility]  832-8035 °   [facility]  832-7272 °   [facility]  271-5999 °   [facility]  832-4777 °   [facility]  373-0678 °   [facility]  [phone] ° °Psychological Services °[facility]  [phone] °[facility]  [phone] °[facility]   [phone] (emergency services 641-4993) ° °Substance Abuse Resources °[facility]  336-882-2125 °[facility] 336-784-9470 °[facility] 336-285-9073 °[facility] 336-845-3988 °Residential & Outpatient Substance Abuse Program  800-659-3381 ° °Abuse/Neglect °[place] [facility] (336)  641-3795 °[place] [facility] 800-378-5315 (After Hours) ° °Emergency Shelter °[facility] (336) 271-5985 ° °Maternity Homes °[facility] (336) 275-9566 °[facility] (704) 372-4663 ° °MRSA Hotline #:   832-7006 ° ° ° °[place] [facility] ° °[facility] [place] [facility]     [employer]                          [facility] °[address] [place]                       [address]      [address]  °  [place]                                                [place]                            [place] °Phone:  349-3220                                   Phone:  [phone]                 Phone:  342-8140 ° °[facility] °Phone:  342-8316 ° °[place] [facility] Hotline °(336) 342-1394 °(336) 342-3537 (After Hours) ° °

## 2011-11-14 NOTE — ED Provider Notes (Signed)
History     CSN: [id number]  Arrival date & time 11/14/11  1442   First MD Initiated Contact with Patient 11/14/11 1707      Chief Complaint  Patient presents with  . Sore Throat  . Otalgia    (Consider location/radiation/quality/duration/timing/severity/associated sxs/prior treatment) HPI Comments: Patient with a history of bipolar type I presents emergency department with a chief complaint of sore throat and otalgia.  Onset of symptoms began about 3 days ago and have gradually been worsening.  Associated symptoms include dysphasia, a productive cough with yellow-green sputum, fevers as high as 103.2 controlled currently with Tylenol, neck pain, malaise, diaphoresis, night sweats and chills, and palpitations.  Patient denies any chest pain, abdominal pain, shortness of breath, inability to breathe, urinary symptoms including urinary frequency.  Patient states that she feels dehydrated because it hurts even when she drinks water. Pt denies having a change in her voice, nuchal rigidity, wheezing, stridor, headache, or drooling. No other complaint sat this time.   The history is provided by the patient.    Past Medical History  Diagnosis Date  . Depression   . Bipolar 1 disorder, depressed   . PTSD (post-traumatic stress disorder)   . Kidney stone   . Migraine   . Urinary tract infection     Past Surgical History  Procedure Date  . Kidney stones   . Lipotripsy     Family History  Problem Relation Age of Onset  . Rheum arthritis Mother   . Asthma Mother   . Hypertension Mother     History  Substance Use Topics  . Smoking status: Current Everyday Smoker  . Smokeless tobacco: Never Used  . Alcohol Use: No    OB History    Grav Para Term Preterm Abortions TAB SAB Ect Mult Living   3 2 2  1  1   2       Review of Systems  All other systems reviewed and are negative.    Allergies  Darvocet; Sulfa antibiotics; and Tomato  Home Medications   Current Outpatient  Rx  Name Route Sig Dispense Refill  . CLONAZEPAM 1 MG PO TABS Oral Take 1 mg by mouth 3 (three) times daily as needed. For anxiety     . IBUPROFEN 800 MG PO TABS Oral Take 800 mg by mouth every 8 (eight) hours as needed. For pain.     . VENLAFAXINE HCL ER 75 MG PO CP24 Oral Take 75 mg by mouth 2 (two) times daily.        BP 156/76  Pulse 104  Temp(Src) 98.7 F (37.1 C) (Oral)  Resp 20  SpO2 99%  LMP 11/14/2011  Physical Exam  Nursing note and vitals reviewed. Constitutional: She is oriented to person, place, and time. She appears well-developed and well-nourished. No distress.  HENT:  Head: Normocephalic and atraumatic. No trismus in the jaw.  Right Ear: Tympanic membrane, external ear and ear canal normal.  Left Ear: Tympanic membrane, external ear and ear canal normal.  Nose: Nose normal. No rhinorrhea. Right sinus exhibits no maxillary sinus tenderness and no frontal sinus tenderness. Left sinus exhibits no maxillary sinus tenderness and no frontal sinus tenderness.  Mouth/Throat: Uvula is midline and mucous membranes are normal. Normal dentition. No dental abscesses or uvula swelling. Oropharyngeal exudate and posterior oropharyngeal edema present. No posterior oropharyngeal erythema or tonsillar abscesses.       No submental edema, tongue not elevated, ? mild trismus. No impending airway obstruction; Pt  able to speak full sentences, swallow intact, no drooling, stridor, or tonsillar/uvula displacement. No palatal petechia  Eyes: Conjunctivae are normal.  Neck: Trachea normal, normal range of motion and full passive range of motion without pain. Neck supple. No rigidity. Erythema present. Normal range of motion present. No Brudzinski's sign noted.       Flexion and extension of neck without pain or difficulty. Able to breath without difficulty in extension.  Cardiovascular: Normal rate and regular rhythm.        Tachycardia, no abnormal sounds on auscultation.    Pulmonary/Chest:  Effort normal and breath sounds normal. No stridor. No respiratory distress. She has no wheezes.  Abdominal: Soft. There is no tenderness.       No obvious evidence of splenomegaly. Non ttp.   Musculoskeletal: Normal range of motion.  Lymphadenopathy:       Head (right side): No preauricular and no posterior auricular adenopathy present.       Head (left side): No preauricular and no posterior auricular adenopathy present.    She has cervical adenopathy.  Neurological: She is alert and oriented to person, place, and time.  Skin: Skin is warm and dry. No rash noted. She is not diaphoretic.  Psychiatric: She has a normal mood and affect.    ED Course  Procedures (including critical care time)  Labs Reviewed  POCT I-STAT, CHEM 8 - Abnormal; Notable for the following:    Potassium 3.4 (*)    BUN 3 (*)    Calcium, Ion 1.09 (*)    All other components within normal limits  POCT PREGNANCY, URINE  CBC  DIFFERENTIAL  URINALYSIS, ROUTINE W REFLEX MICROSCOPIC  RAPID STREP SCREEN   Ct Soft Tissue Neck W Contrast  11/14/2011  *RADIOLOGY REPORT*  Clinical Data: High-grade fever.  Sore throat.  Headache.  The patient reports that her son was positive for strep throat 3 days ago.  CT NECK WITH CONTRAST  Technique:  Multidetector CT imaging of the neck was performed with intravenous contrast.  Contrast: OMNIPAQUE IOHEXOL 300 MG/ML  SOLN  Comparison: None.  Findings: There is extensive hypertrophy of the palatine tonsils and adenoids lymphoid tissue.  The airway is narrowed, but patent. No focal flight moderate evolving abscess is evident.  Enlarged lateral pharyngeal lymph nodes are seen bilaterally, measuring 60 mm on the right and 18 mm on the left.  Enlarged level II lymph nodes are present bilaterally, likely reactive.  Smaller level III and level IV nodes are seen bilaterally.  No significant prevertebral fluid is present.  Soft tissue within the anterior mediastinum likely represents thymus.   The area is degraded by body habitus.  The bone windows are unremarkable.  The lung apices are clear.  IMPRESSION:  1.  Diffuse enlargement of the adenoid tissues and palatine tonsils bilaterally. 2.  No evidence for focal or developing abscess. 3.  Bilateral level II lymph nodes are likely reactive. 4.  Smaller level III and level IV lymph nodes are also reactive. 5.  Soft tissue within the anterior mediastinum likely represents thymus.  Original Report Authenticated By: Jamesetta Orleans. MATTERN, M.D.     No diagnosis found.    MDM  Tonsillitis, trichomonas   Pt with patent airway, exam and CT non concerning for Ludwigs angina, abscess or spreading infection. Uvula midline. Step positive. Treated in ED w pain meds abx and decadron.  Return precautions discussed.   Patient to be discharged with instructions to follow up with OBGYN. Discussed importance  of using protection when sexually active. Pt understands that they have GC/Chlamydia cultures pending and that they will need to inform all sexual partners if results return positive. Pt has been treated prophylacticly with azithromycin and rocephin due to pts history, pelvic exam, and wet prep with increased WBCs. Pt not concerning for PID because hemodynamically stable and no cervical motion tenderness on pelvic exam. Pt has also been treated with flagyl for Bacterial Vaginosis. Pt has been advised to not drink alcohol while on this medication.          Jaci Carrel, [place] 11/14/11 2009

## 2011-11-16 NOTE — ED Notes (Signed)
+  Chlamydia. Patient treated with Rocephin and Zithromax. [facility] faxed. 

## 2011-11-18 NOTE — ED Notes (Signed)
Left voicemail for patient to call back. 

## 2011-11-22 NOTE — ED Notes (Signed)
Left message for patient to return call.

## 2011-12-04 NOTE — ED Notes (Signed)
Letter sent to Epic address/no response/Chart appended and sent to Medical records.

## 2012-01-05 ENCOUNTER — Inpatient Hospital Stay (HOSPITAL_COMMUNITY)
Admission: AD | Admit: 2012-01-05 | Discharge: 2012-01-06 | Disposition: A | Discharge status: Home / Self Care | Payer: Self-pay | Source: Ambulatory Visit | Attending: Obstetrics & Gynecology | Admitting: Obstetrics & Gynecology

## 2012-01-05 ENCOUNTER — Encounter (HOSPITAL_COMMUNITY): Payer: Self-pay | Admitting: *Deleted

## 2012-01-05 DIAGNOSIS — N949 Unspecified condition associated with female genital organs and menstrual cycle: Secondary | ICD-10-CM

## 2012-01-05 DIAGNOSIS — N938 Other specified abnormal uterine and vaginal bleeding: Secondary | ICD-10-CM

## 2012-01-05 DIAGNOSIS — D649 Anemia, unspecified: Secondary | ICD-10-CM | POA: Insufficient documentation

## 2012-01-05 LAB — URINALYSIS, ROUTINE W REFLEX MICROSCOPIC
Ketones, ur: NEGATIVE mg/dL
Nitrite: NEGATIVE
Protein, ur: 100 mg/dL — AB
Urobilinogen, UA: 0.2 mg/dL (ref 0.0–1.0)

## 2012-01-05 LAB — CBC
MCH: 23 pg — ABNORMAL LOW (ref 26.0–34.0)
MCHC: 30.6 g/dL (ref 30.0–36.0)
Platelets: 287 10*3/uL (ref 150–400)

## 2012-01-05 LAB — POCT PREGNANCY, URINE: Preg Test, Ur: NEGATIVE

## 2012-01-05 LAB — URINE MICROSCOPIC-ADD ON

## 2012-01-05 NOTE — MAU Note (Signed)
Pt states, " I have had vaginal bleeding for seventeen days but three days ago I started passing clots and having cramping in my low abdomen and low back."

## 2012-01-06 ENCOUNTER — Inpatient Hospital Stay (HOSPITAL_COMMUNITY): Payer: Self-pay

## 2012-01-06 MED ORDER — FERROUS SULFATE 325 (65 FE) MG PO TABS: 325.0000 mg | ORAL_TABLET | Freq: Every day | ORAL | Status: DC

## 2012-01-06 MED ORDER — KETOROLAC TROMETHAMINE 60 MG/2ML IM SOLN
60.0000 mg | Freq: Once | INTRAMUSCULAR | Status: AC
  Administered 2012-01-06: 60 mg via INTRAMUSCULAR
  Filled 2012-01-06: qty 2

## 2012-01-06 MED ORDER — MEDROXYPROGESTERONE ACETATE 10 MG PO TABS: 10.0000 mg | ORAL_TABLET | Freq: Every day | ORAL | Status: DC

## 2012-01-06 NOTE — MAU Provider Note (Signed)
History     CSN: [id number]  Arrival date and time: 01/05/12 2219   First Provider Initiated Contact with Patient 01/05/12 2350      Chief Complaint  Patient presents with  . Vaginal Bleeding   HPI [name] is a 26 y.o. female who presents to MAU for vaginal bleeding. The bleeding started 18 days ago. She describes the bleeding as heavier than a period. LMP 7/10 but has continued and gotten heavy. Associated symptoms include dizziness and nausea.  Using a pad an hour for the past 7 days. Past few hours using 2 pad an hour. Called [employer] office tonight and spoke with on call nurse and told to come in for evaluation.  Last pap smear 3/13 and was normal. Current sex partner x 9 years. Hx of Chlamydia and GC, trichomonas. The history was provided by the patient. OB History    Grav Para Term Preterm Abortions TAB SAB Ect Mult Living   3 2 2  1  1   2       Past Medical History  Diagnosis Date  . Depression   . Bipolar 1 disorder, depressed   . PTSD (post-traumatic stress disorder)   . Kidney stone   . Migraine   . Urinary tract infection   . Pregnancy induced hypertension     Past Surgical History  Procedure Date  . Kidney stones   . Lipotripsy     Family History  Problem Relation Age of Onset  . Rheum arthritis Mother   . Asthma Mother   . Hypertension Mother   . Other Neg Hx     History  Substance Use Topics  . Smoking status: Current Everyday Smoker  . Smokeless tobacco: Never Used  . Alcohol Use: No    Allergies:  Allergies  Allergen Reactions  . Darvocet (Propoxyphene-Acetaminophen) Hives and Nausea And Vomiting  . Sulfa Antibiotics Hives  . Tomato Hives    Prescriptions prior to admission  Medication Sig Dispense Refill  . butalbital-acetaminophen-caffeine (FIORICET WITH CODEINE) 50-325-40-30 MG per capsule Take 1 capsule by mouth every 4 (four) hours as needed.      Marland Kitchen ibuprofen (ADVIL,MOTRIN) 200 MG tablet Take 400 mg by mouth every 6  (six) hours as needed.      . ARIPiprazole (ABILIFY) 20 MG tablet Take 20 mg by mouth daily.      . clonazePAM (KLONOPIN) 1 MG tablet Take 1 mg by mouth 3 (three) times daily as needed. For anxiety       . ibuprofen (ADVIL,MOTRIN) 800 MG tablet Take 800 mg by mouth every 8 (eight) hours as needed. For pain.      Marland Kitchen venlafaxine (EFFEXOR-XR) 75 MG 24 hr capsule Take 75 mg by mouth 2 (two) times daily.          Review of Systems  Constitutional: Positive for malaise/fatigue. Negative for fever, chills and weight loss.  HENT: Negative for ear pain, nosebleeds, congestion, sore throat and neck pain.   Eyes: Negative for blurred vision, double vision, photophobia and pain.  Respiratory: Negative for cough, shortness of breath and wheezing.   Cardiovascular: Negative for chest pain, palpitations and leg swelling.  Gastrointestinal: Positive for nausea, vomiting and abdominal pain. Negative for heartburn, diarrhea and constipation.  Genitourinary: Positive for frequency. Negative for dysuria and urgency.       Vaginal bleeding  Musculoskeletal: Positive for back pain. Negative for myalgias.  Skin: Negative for itching and rash.  Neurological: Positive for headaches. Negative for  dizziness, sensory change, speech change, seizures and weakness.  Endo/Heme/Allergies: Does not bruise/bleed easily.  Psychiatric/Behavioral: Negative for depression (hx of depression and anxiety but ok now). The patient is not nervous/anxious and does not have insomnia.    Blood pressure 135/102, pulse 112, temperature 98.2 F (36.8 C), temperature source Oral, resp. rate 20, height 5' 5.5" (1.664 m), weight 385 lb 8 oz (174.862 kg), last menstrual period 12/19/2011.  Physical Exam  Constitutional: She is oriented to person, place, and time. No distress.       Morbidly obese W/F  HENT:  Head: Normocephalic and atraumatic.  Eyes: EOM are normal.  Neck: Neck supple.  Cardiovascular:       Tachycardia   Respiratory:  Effort normal.  GI: Soft. There is no rebound and no guarding.       Obese. Tender with palpation lower abdomen.  Genitourinary:       External genitalia without lesions. Moderate blood vaginal vault. Cervix closed, no CMT, no adnexal tenderness. Unable to palpate uterus due to patient habitus.  Musculoskeletal: Normal range of motion.  Neurological: She is alert and oriented to person, place, and time.  Skin: Skin is warm and dry.  Psychiatric: She has a normal mood and affect. Her behavior is normal. Judgment and thought content normal.   Results for orders placed during the hospital encounter of 01/05/12 (from the past 24 hour(s))  URINALYSIS, ROUTINE W REFLEX MICROSCOPIC     Status: Abnormal   Collection Time   01/05/12 10:39 PM      Component Value Range   Color, Urine RED (*) YELLOW   APPearance CLEAR  CLEAR   Specific Gravity, Urine >1.030 (*) 1.005 - 1.030   pH 6.0  5.0 - 8.0   Glucose, UA NEGATIVE  NEGATIVE mg/dL   Hgb urine dipstick LARGE (*) NEGATIVE   Bilirubin Urine SMALL (*) NEGATIVE   Ketones, ur NEGATIVE  NEGATIVE mg/dL   Protein, ur 161 (*) NEGATIVE mg/dL   Urobilinogen, UA 0.2  0.0 - 1.0 mg/dL   Nitrite NEGATIVE  NEGATIVE   Leukocytes, UA TRACE (*) NEGATIVE  URINE MICROSCOPIC-ADD ON     Status: Abnormal   Collection Time   01/05/12 10:39 PM      Component Value Range   Squamous Epithelial / LPF FEW (*) RARE   WBC, UA 3-6  <3 WBC/hpf   RBC / HPF TOO NUMEROUS TO COUNT  <3 RBC/hpf   Bacteria, UA MANY (*) RARE   Urine-Other MUCOUS PRESENT    POCT PREGNANCY, URINE     Status: Normal   Collection Time   01/05/12 10:50 PM      Component Value Range   Preg Test, Ur NEGATIVE  NEGATIVE  CBC     Status: Abnormal   Collection Time   01/05/12 11:35 PM      Component Value Range   WBC 10.1  4.0 - 10.5 K/uL   RBC 4.27  3.87 - 5.11 MIL/uL   Hemoglobin 9.8 (*) 12.0 - 15.0 g/dL   HCT 09.6 (*) 04.5 - 40.9 %   MCV 74.9 (*) 78.0 - 100.0 fL   MCH 23.0 (*) 26.0 - 34.0 pg    MCHC 30.6  30.0 - 36.0 g/dL   RDW 81.1 (*) 91.4 - 78.2 %   Platelets 287  150 - 400 K/uL  WET PREP, GENITAL     Status: Abnormal   Collection Time   01/06/12 12:40 AM      Component Value  Range   Yeast Wet Prep HPF POC NONE SEEN  NONE SEEN   Trich, Wet Prep NONE SEEN  NONE SEEN   Clue Cells Wet Prep HPF POC FEW (*) NONE SEEN   WBC, Wet Prep HPF POC FEW (*) NONE SEEN   MAU Course: discussed with Dr. Tamela Oddi and she agrees with plan of care.  Procedures    *RADIOLOGY REPORT*  Clinical Data: Abnormal uterine bleeding. Pelvic pain  TRANSABDOMINAL AND TRANSVAGINAL ULTRASOUND OF PELVIS  Technique: Both transabdominal and transvaginal ultrasound  examinations of the pelvis were performed. Transabdominal technique  was performed for global imaging of the pelvis including uterus,  ovaries, adnexal regions, and pelvic cul-de-sac.  It was necessary to proceed with endovaginal exam following the  transabdominal exam to visualize the endometrium and adnexa.  Comparison: 09/12/2010  Findings:  Uterus: Normal in size and appearance, measuring 8.9 x 5.1 x 5.8  cm.  Endometrium: Measures up to 21 mm in thickness.  Right ovary: Measures 2.9 x 1.9 x 2.1 cm. No focal abnormality.  Left ovary: Measures 2.8 x 1.7 x 1.7 cm. No focal abnormality.  Other findings: No free fluid  IMPRESSION:  Nonspecific endometrial thickening, may be within normal limits  depending on the timing of the menstrual cycle. Given the stated  history, a follow-up ultrasound immediately after menstruation  should be obtained to better evaluate. If the endometrium remains  thickened at that time, sonohysterography can be helpful to  distinguish if a focal lesion is present.  Original Report Authenticated By: Waneta Martins, M.D.    Assessment: Abnormal vaginal bleeding   Anemia  Plan:  Rx Fe   Provera 10 mg po daily Rx   Follow up with Dr. Tamela Oddi, return here as needed.  NEESE,HOPE 01/06/2012, 12:06  AM

## 2012-01-06 NOTE — Progress Notes (Signed)
Written and verbal d/c instructions given and understanding voiced. 

## 2012-01-08 LAB — GC/CHLAMYDIA PROBE AMP, GENITAL: GC Probe Amp, Genital: NEGATIVE

## 2012-01-14 ENCOUNTER — Encounter (HOSPITAL_COMMUNITY): Payer: Self-pay | Admitting: *Deleted

## 2012-01-14 ENCOUNTER — Emergency Department (HOSPITAL_COMMUNITY)
Admission: EM | Admit: 2012-01-14 | Discharge: 2012-01-14 | Disposition: A | Discharge status: Home / Self Care | Payer: Medicaid Other | Attending: Emergency Medicine | Admitting: Emergency Medicine

## 2012-01-14 DIAGNOSIS — F17200 Nicotine dependence, unspecified, uncomplicated: Secondary | ICD-10-CM | POA: Insufficient documentation

## 2012-01-14 DIAGNOSIS — Z882 Allergy status to sulfonamides status: Secondary | ICD-10-CM | POA: Insufficient documentation

## 2012-01-14 DIAGNOSIS — Z885 Allergy status to narcotic agent status: Secondary | ICD-10-CM | POA: Insufficient documentation

## 2012-01-14 DIAGNOSIS — K047 Periapical abscess without sinus: Secondary | ICD-10-CM | POA: Insufficient documentation

## 2012-01-14 DIAGNOSIS — Z79899 Other long term (current) drug therapy: Secondary | ICD-10-CM | POA: Insufficient documentation

## 2012-01-14 DIAGNOSIS — Z91018 Allergy to other foods: Secondary | ICD-10-CM | POA: Insufficient documentation

## 2012-01-14 MED ORDER — HYDROCODONE-ACETAMINOPHEN 5-325 MG PO TABS: ORAL_TABLET | ORAL | Status: AC

## 2012-01-14 MED ORDER — PENICILLIN V POTASSIUM 500 MG PO TABS: 500.0000 mg | ORAL_TABLET | Freq: Three times a day (TID) | ORAL | Status: AC

## 2012-01-14 MED ORDER — HYDROCODONE-ACETAMINOPHEN 5-325 MG PO TABS
1.0000 | ORAL_TABLET | Freq: Once | ORAL | Status: AC
  Administered 2012-01-14: 1 via ORAL
  Filled 2012-01-14: qty 1

## 2012-01-14 MED ORDER — IBUPROFEN 800 MG PO TABS: 800.0000 mg | ORAL_TABLET | Freq: Three times a day (TID) | ORAL | Status: AC | PRN

## 2012-01-14 NOTE — ED Provider Notes (Signed)
History     CSN: [id number]  Arrival date & time 01/14/12  1510   First MD Initiated Contact with Patient 01/14/12 1549      No chief complaint on file.   (Consider location/radiation/quality/duration/timing/severity/associated sxs/prior treatment) HPI Comments: Patients with three-day history of left-sided lower jaw pain in the area of a tooth that she recently chipped. Patient has noted swelling in her mouth as well as a bad taste. She thinks that she has an abscess that is draining. She denies fever. She has had nausea and episodes of vomiting overnight. She states she that she thinks the vomiting is related to the Provera that she started taking last week for vaginal bleeding. She's been taking ibuprofen at home without relief. She denies neck swelling or trouble breathing. Nothing makes the symptoms better or worse. Onset was gradual. Course is constant. Patient states that she has a dental appointment in 4 days.  Patient is a 26 y.o. female presenting with tooth pain. The history is provided by the patient.  Dental PainThe primary symptoms include mouth pain and headaches. Primary symptoms do not include fever, shortness of breath or sore throat. The symptoms began 3 to 5 days ago. The symptoms are unchanged. The symptoms are new. The symptoms occur constantly.  Additional symptoms include: gum swelling and ear pain. Additional symptoms do not include: facial swelling and trouble swallowing.    Past Medical History  Diagnosis Date  . Depression   . Bipolar 1 disorder, depressed   . PTSD (post-traumatic stress disorder)   . Kidney stone   . Migraine   . Urinary tract infection   . Pregnancy induced hypertension     Past Surgical History  Procedure Date  . Kidney stones   . Lipotripsy     Family History  Problem Relation Age of Onset  . Rheum arthritis Mother   . Asthma Mother   . Hypertension Mother   . Other Neg Hx     History  Substance Use Topics  . Smoking  status: Current Everyday Smoker  . Smokeless tobacco: Never Used  . Alcohol Use: No    OB History    Grav Para Term Preterm Abortions TAB SAB Ect Mult Living   3 2 2  1  1   2       Review of Systems  Constitutional: Negative for fever.  HENT: Positive for ear pain and dental problem. Negative for sore throat, facial swelling, trouble swallowing and neck pain.   Respiratory: Negative for shortness of breath and stridor.   Skin: Negative for color change.  Neurological: Positive for headaches.    Allergies  Darvocet; Sulfa antibiotics; and Tomato  Home Medications   Current Outpatient Rx  Name Route Sig Dispense Refill  . IBUPROFEN 800 MG PO TABS Oral Take 800 mg by mouth every 8 (eight) hours as needed. For pain.    Marland Kitchen MEDROXYPROGESTERONE ACETATE 10 MG PO TABS Oral Take 1 tablet (10 mg total) by mouth daily. 14 tablet 0    LMP 12/19/2011  Physical Exam  Nursing note and vitals reviewed. Constitutional: She appears well-developed and well-nourished.  HENT:  Head: Normocephalic and atraumatic. No trismus in the jaw.  Right Ear: Tympanic membrane, external ear and ear canal normal.  Left Ear: Tympanic membrane, external ear and ear canal normal.  Nose: Nose normal.  Mouth/Throat: Uvula is midline, oropharynx is clear and moist and mucous membranes are normal. Abnormal dentition. Dental caries present. No dental abscesses or uvula swelling.  No tonsillar abscesses.       Patient with L mandibular tooth pain and tenderness to palpation in area of 2nd premolar which is broken to gumline. There is swelling and a dental abscess palpated in this area.   Eyes: Conjunctivae are normal.  Neck: Normal range of motion. Neck supple.       No neck swelling or Ludwig's angina  Lymphadenopathy:    She has no cervical adenopathy.  Neurological: She is alert.  Skin: Skin is warm and dry.  Psychiatric: She has a normal mood and affect.    ED Course  Procedures (including critical care  time)  Labs Reviewed - No data to display No results found.   1. Dental abscess     3:57 PM Patient seen and examined. Work-up initiated. Medications ordered.   Vital signs reviewed and are as follows: Filed Vitals:   01/14/12 1559  BP: 141/89  Pulse: 93  Temp: 98.1 F (36.7 C)  Resp: 16      3:57 PM Patient counseled to take prescribed medications as directed, return with worsening facial or neck swelling, and to follow-up with his dentist as soon as possible.   3:57 PM Patient counseled on use of narcotic pain medications. Counseled not to combine these medications with others containing tylenol. Urged not to drink alcohol, drive, or perform any other activities that requires focus while taking these medications. The patient verbalizes understanding and agrees with the plan.   MDM  Patient with toothache with dental abscess. It has already been draining, will not I&D here given appearance.  Exam unconcerning for Ludwig's angina or other deep tissue infection in neck.  Will treat with penicillin and pain medicine.  Urged patient to follow-up with dentist.           Renne Crigler, PA 01/14/12 [phone]

## 2012-01-14 NOTE — ED Notes (Signed)
Pt reports left sided dental pain, on Friday pt was eating an apple and part of her tooth chipped off. Pain 10/10.

## 2012-01-14 NOTE — ED Notes (Signed)
Pt alert and oriented x4. Respirations even and unlabored, bilateral symmetrical rise and fall of chest. Skin warm and dry. In no acute distress. Denies needs.   

## 2012-01-14 NOTE — ED Notes (Signed)
PA at bedside.

## 2012-01-15 NOTE — ED Provider Notes (Signed)
Medical screening examination/treatment/procedure(s) were performed by non-physician practitioner and as supervising physician I was immediately available for consultation/collaboration.  Cyndra Numbers, MD 01/15/12 1251

## 2012-01-30 ENCOUNTER — Encounter (HOSPITAL_COMMUNITY): Payer: Self-pay | Admitting: *Deleted

## 2012-01-30 ENCOUNTER — Inpatient Hospital Stay (HOSPITAL_COMMUNITY)
Admission: AD | Admit: 2012-01-30 | Discharge: 2012-01-30 | Disposition: A | Discharge status: Home / Self Care | Payer: Medicaid Other | Source: Ambulatory Visit | Attending: Obstetrics & Gynecology | Admitting: Obstetrics & Gynecology

## 2012-01-30 DIAGNOSIS — D649 Anemia, unspecified: Secondary | ICD-10-CM | POA: Insufficient documentation

## 2012-01-30 DIAGNOSIS — N938 Other specified abnormal uterine and vaginal bleeding: Secondary | ICD-10-CM

## 2012-01-30 DIAGNOSIS — N949 Unspecified condition associated with female genital organs and menstrual cycle: Secondary | ICD-10-CM | POA: Insufficient documentation

## 2012-01-30 LAB — URINALYSIS, ROUTINE W REFLEX MICROSCOPIC
Glucose, UA: NEGATIVE mg/dL
Specific Gravity, Urine: >1.03 — ABNORMAL HIGH (ref 1.005–1.030)
pH: 6 (ref 5.0–8.0)

## 2012-01-30 LAB — URINE MICROSCOPIC-ADD ON

## 2012-01-30 LAB — CBC
HCT: 28.8 % — ABNORMAL LOW (ref 36.0–46.0)
MCHC: 30.2 g/dL (ref 30.0–36.0)
RDW: 17.2 % — ABNORMAL HIGH (ref 11.5–15.5)

## 2012-01-30 MED ORDER — KETOROLAC TROMETHAMINE 60 MG/2ML IM SOLN
60.0000 mg | Freq: Once | INTRAMUSCULAR | Status: AC
  Administered 2012-01-30: 60 mg via INTRAMUSCULAR
  Filled 2012-01-30: qty 2

## 2012-01-30 MED ORDER — MEDROXYPROGESTERONE ACETATE 10 MG PO TABS: 10.0000 mg | ORAL_TABLET | Freq: Every day | ORAL | Status: DC

## 2012-01-30 MED ORDER — DICLOFENAC SODIUM 75 MG PO TBEC: 75.0000 mg | DELAYED_RELEASE_TABLET | Freq: Two times a day (BID) | ORAL | Status: DC

## 2012-01-30 MED ORDER — FERROUS SULFATE 325 (65 FE) MG PO TABS: 325.0000 mg | ORAL_TABLET | Freq: Three times a day (TID) | ORAL | Status: DC

## 2012-01-30 NOTE — MAU Note (Signed)
Pt reports heavy bleeding 7/10-7/18 seen in MAU started taking provera and iron.  Started bleeding again 8/19, feeling weak, dizzy and headaches.

## 2012-01-30 NOTE — MAU Provider Note (Signed)
Chief Complaint:  Vaginal Bleeding    First Provider Initiated Contact with Patient 01/30/12 2226      [name] is  26 y.o. [id number].  Patient's last menstrual period was 01/19/2012..   She presents complaining of Vaginal Bleeding  Pt presents with vaginal bleeding since 01/26/12, reports heavier today. States changing a pad every hour with small clots. Reports feeling weak. Denies dizziness, palpitations, or visual disturbance.  Obstetrical/Gynecological History: OB History    Grav Para Term Preterm Abortions TAB SAB Ect Mult Living   3 2 2  1  1   2       Past Medical History: Past Medical History  Diagnosis Date  . Depression   . Bipolar 1 disorder, depressed   . PTSD (post-traumatic stress disorder)   . Kidney stone   . Migraine   . Urinary tract infection   . Pregnancy induced hypertension     Past Surgical History: Past Surgical History  Procedure Date  . Kidney stones   . Lipotripsy   . No past surgeries     Family History: Family History  Problem Relation Age of Onset  . Rheum arthritis Mother   . Asthma Mother   . Hypertension Mother   . Other Neg Hx     Social History: History  Substance Use Topics  . Smoking status: Current Everyday Smoker  . Smokeless tobacco: Never Used  . Alcohol Use: No    Allergies:  Allergies  Allergen Reactions  . Darvocet (Propoxyphene-Acetaminophen) Hives and Nausea And Vomiting  . Sulfa Antibiotics Hives  . Tomato Hives    Prescriptions prior to admission  Medication Sig Dispense Refill  . acetaminophen (TYLENOL) 500 MG tablet Take 500 mg by mouth every 6 (six) hours as needed. For pain      . clonazePAM (KLONOPIN) 1 MG tablet Take 1 mg by mouth 3 (three) times daily as needed. For anxiety      . HYDROcodone-acetaminophen (LORCET) 10-650 MG per tablet Take 1 tablet by mouth every 6 (six) hours as needed. For pain      . Prenatal Vit-Fe Fumarate-FA (PRENATAL MULTIVITAMIN) TABS Take 1 tablet by mouth  daily.        Review of Systems - History obtained from chart review and the patient General ROS: negative for - chills or fever Respiratory ROS: no cough, shortness of breath, or wheezing Cardiovascular ROS: no chest pain or dyspnea on exertion Gastrointestinal ROS: no abdominal pain, change in bowel habits, or black or bloody stools Genito-Urinary ROS: no dysuria, trouble voiding, or hematuria positive for - change in menstrual cycle and irregular/heavy menses Neurological ROS: no TIA or stroke symptoms negative for - dizziness or headaches Positive for Weakness  Physical Exam   Blood pressure 137/77, pulse 97, temperature 98 F (36.7 C), temperature source Oral, resp. rate 20, height 5\' 7"  (1.702 m), weight 378 lb 3.2 oz (171.55 kg), last menstrual period 01/19/2012.  General: General appearance - alert, well appearing, and in no distress, oriented to person, place, and time and morbidly obese Mental status - alert, oriented to person, place, and time, normal mood, behavior, speech, dress, motor activity, and thought processes Abdomen - soft, nontender, nondistended, no masses or organomegaly, morbid obesity Neurological - alert, oriented, normal speech, no focal findings or movement disorder noted, screening mental status exam normal, cranial nerves II through XII intact Extremities - peripheral pulses normal, no pedal edema, no clubbing or cyanosis Focused Gynecological Exam: VULVA: normal appearing vulva with no  masses, tenderness or lesions, VAGINA: vaginal discharge - mod amount of menstrual bleeding, CERVIX: normal appearing cervix without discharge or lesions, UTERUS: unable to appreciate d/t pt habitus, non tender  Labs: Recent Results (from the past 24 hour(s))  URINALYSIS, ROUTINE W REFLEX MICROSCOPIC   Collection Time   01/30/12  9:30 PM      Component Value Range   Color, Urine RED (*) YELLOW   APPearance CLEAR  CLEAR   Specific Gravity, Urine >1.030 (*) 1.005 - 1.030     pH 6.0  5.0 - 8.0   Glucose, UA NEGATIVE  NEGATIVE mg/dL   Hgb urine dipstick LARGE (*) NEGATIVE   Bilirubin Urine NEGATIVE  NEGATIVE   Ketones, ur NEGATIVE  NEGATIVE mg/dL   Protein, ur 30 (*) NEGATIVE mg/dL   Urobilinogen, UA 0.2  0.0 - 1.0 mg/dL   Nitrite NEGATIVE  NEGATIVE   Leukocytes, UA NEGATIVE  NEGATIVE  URINE MICROSCOPIC-ADD ON   Collection Time   01/30/12  9:30 PM      Component Value Range   Squamous Epithelial / LPF RARE  RARE   WBC, UA 3-6  <3 WBC/hpf   RBC / HPF TOO NUMEROUS TO COUNT  <3 RBC/hpf   Bacteria, UA FEW (*) RARE   Crystals CA OXALATE CRYSTALS (*) NEGATIVE   Imaging Studies:  US Transvaginal Non-ob  01/06/2012  *RADIOLOGY REPORT*  Clinical Data: Abnormal uterine bleeding.  Pelvic pain  TRANSABDOMINAL AND TRANSVAGINAL ULTRASOUND OF PELVIS Technique:  Both transabdominal and transvaginal ultrasound examinations of the pelvis were performed. Transabdominal technique was performed for global imaging of the pelvis including uterus, ovaries, adnexal regions, and pelvic cul-de-sac.  It was necessary to proceed with endovaginal exam following the transabdominal exam to visualize the endometrium and adnexa.  Comparison:  09/12/2010  Findings:  Uterus: Normal in size and appearance, measuring 8.9 x 5.1 x 5.8 cm.  Endometrium: Measures up to 21 mm in thickness.  Right ovary:  Measures 2.9 x 1.9 x 2.1 cm.  No focal abnormality.  Left ovary: Measures 2.8 x 1.7 x 1.7 cm.  No focal abnormality.  Other findings: No free fluid  IMPRESSION: Nonspecific endometrial thickening, may be within normal limits depending on the timing of the menstrual cycle. Given the stated history, a follow-up ultrasound immediately after menstruation should be obtained to better evaluate.  If the endometrium remains thickened at that time, sonohysterography can be helpful to distinguish if a focal lesion is present.  Original Report Authenticated By: Waneta Martins, M.D.   US Pelvis  Complete  01/06/2012  *RADIOLOGY REPORT*  Clinical Data: Abnormal uterine bleeding.  Pelvic pain  TRANSABDOMINAL AND TRANSVAGINAL ULTRASOUND OF PELVIS Technique:  Both transabdominal and transvaginal ultrasound examinations of the pelvis were performed. Transabdominal technique was performed for global imaging of the pelvis including uterus, ovaries, adnexal regions, and pelvic cul-de-sac.  It was necessary to proceed with endovaginal exam following the transabdominal exam to visualize the endometrium and adnexa.  Comparison:  09/12/2010  Findings:  Uterus: Normal in size and appearance, measuring 8.9 x 5.1 x 5.8 cm.  Endometrium: Measures up to 21 mm in thickness.  Right ovary:  Measures 2.9 x 1.9 x 2.1 cm.  No focal abnormality.  Left ovary: Measures 2.8 x 1.7 x 1.7 cm.  No focal abnormality.  Other findings: No free fluid  IMPRESSION: Nonspecific endometrial thickening, may be within normal limits depending on the timing of the menstrual cycle. Given the stated history, a follow-up ultrasound immediately after menstruation  should be obtained to better evaluate.  If the endometrium remains thickened at that time, sonohysterography can be helpful to distinguish if a focal lesion is present.  Original Report Authenticated By: Waneta Martins, M.D.     Assessment: 1. DUB (dysfunctional uterine bleeding)   2. Anemia    Plan: Discharge home Bleeding precautions Rx provera and iron supplementation Call office in am to schedule FU appt  Latrise Bowland E. 01/30/2012,10:58 PM

## 2012-01-31 LAB — POCT PREGNANCY, URINE: Preg Test, Ur: NEGATIVE

## 2012-03-06 ENCOUNTER — Encounter (HOSPITAL_COMMUNITY): Payer: Self-pay | Admitting: *Deleted

## 2012-03-06 ENCOUNTER — Inpatient Hospital Stay (HOSPITAL_COMMUNITY)
Admission: AD | Admit: 2012-03-06 | Discharge: 2012-03-06 | Disposition: A | Discharge status: Home / Self Care | Payer: Medicaid Other | Source: Ambulatory Visit | Attending: Obstetrics & Gynecology | Admitting: Obstetrics & Gynecology

## 2012-03-06 DIAGNOSIS — Z3202 Encounter for pregnancy test, result negative: Secondary | ICD-10-CM | POA: Insufficient documentation

## 2012-03-06 DIAGNOSIS — D649 Anemia, unspecified: Secondary | ICD-10-CM | POA: Diagnosis present

## 2012-03-06 DIAGNOSIS — N921 Excessive and frequent menstruation with irregular cycle: Secondary | ICD-10-CM | POA: Diagnosis present

## 2012-03-06 DIAGNOSIS — R109 Unspecified abdominal pain: Secondary | ICD-10-CM | POA: Insufficient documentation

## 2012-03-06 DIAGNOSIS — N946 Dysmenorrhea, unspecified: Secondary | ICD-10-CM | POA: Diagnosis present

## 2012-03-06 LAB — WET PREP, GENITAL
Clue Cells Wet Prep HPF POC: NONE SEEN
Trich, Wet Prep: NONE SEEN
Yeast Wet Prep HPF POC: NONE SEEN

## 2012-03-06 LAB — CBC
HCT: 30 % — ABNORMAL LOW (ref 36.0–46.0)
MCHC: 29.3 g/dL — ABNORMAL LOW (ref 30.0–36.0)
Platelets: 378 10*3/uL (ref 150–400)
RDW: 17.9 % — ABNORMAL HIGH (ref 11.5–15.5)
WBC: 10 10*3/uL (ref 4.0–10.5)

## 2012-03-06 LAB — URINALYSIS, ROUTINE W REFLEX MICROSCOPIC
Glucose, UA: NEGATIVE mg/dL
Ketones, ur: NEGATIVE mg/dL
Leukocytes, UA: NEGATIVE
Protein, ur: NEGATIVE mg/dL

## 2012-03-06 LAB — HCG, QUANTITATIVE, PREGNANCY: hCG, Beta Chain, Quant, S: <1 m[IU]/mL (ref <5)

## 2012-03-06 MED ORDER — KETOROLAC TROMETHAMINE 60 MG/2ML IM SOLN
60.0000 mg | INTRAMUSCULAR | Status: AC
  Administered 2012-03-06: 60 mg via INTRAMUSCULAR
  Filled 2012-03-06: qty 2

## 2012-03-06 MED ORDER — INTEGRA 62.5-62.5-40-3 MG PO CAPS: 1.0000 | ORAL_CAPSULE | Freq: Every day | ORAL | Status: DC

## 2012-03-06 NOTE — MAU Note (Signed)
Pt reports LMP 01/19/2012, positive home preg test 1 week ago, now with cramping, spotting, lower back pain

## 2012-03-06 NOTE — Progress Notes (Signed)
Pt experienced some back discomfort during digital exam,

## 2012-03-06 NOTE — MAU Provider Note (Signed)
Chief Complaint: Abdominal Pain   First Provider Initiated Contact with Patient 03/06/12 2029     SUBJECTIVE HPI: [name] is a 26 y.o. [id number] at Unknown by LMP who presents to maternity admissions reporting back pain, cramping and some intermittent vaginal spotting but no period this month.  She had a faintly positive pregnancy test at home last week.  She was seen in July and August of this year in the MAU for heavy vaginal bleeding and cramping.  Prior to July, her periods were regular.  She did lose 30 llbs immediately prior to these changes in her cycle.  She denies vaginal itching/burning, urinary symptoms, h/a, dizziness, n/v, or fever/chills.     Past Medical History  Diagnosis Date  . Depression   . Bipolar 1 disorder, depressed   . PTSD (post-traumatic stress disorder)   . Kidney stone   . Migraine   . Urinary tract infection   . Pregnancy induced hypertension    Past Surgical History  Procedure Date  . Kidney stones   . Lipotripsy   . No past surgeries    History   Social History  . Marital Status: Single    Spouse Name: N/A    Number of Children: N/A  . Years of Education: N/A   Occupational History  . Not on file.   Social History Main Topics  . Smoking status: Current Every Day Smoker -- 0.2 packs/day  . Smokeless tobacco: Never Used  . Alcohol Use: No  . Drug Use: No  . Sexually Active: Yes    Birth Control/ Protection: Condom, None   Other Topics Concern  . Not on file   Social History Narrative  . No narrative on file   No current facility-administered medications on file prior to encounter.   Current Outpatient Prescriptions on File Prior to Encounter  Medication Sig Dispense Refill  . acetaminophen (TYLENOL) 500 MG tablet Take 500 mg by mouth every 6 (six) hours as needed. For pain      . HYDROcodone-acetaminophen (LORCET) 10-650 MG per tablet Take 1 tablet by mouth every 6 (six) hours as needed. For pain       Allergies  Allergen  Reactions  . Darvocet (Propoxyphene-Acetaminophen) Hives and Nausea And Vomiting  . Sulfa Antibiotics Hives  . Tomato Hives    ROS: Pertinent items in HPI  OBJECTIVE Blood pressure 117/58, pulse 99, temperature 98.5 F (36.9 C), temperature source Oral, resp. rate 20, height 5\' 7"  (1.702 m), weight 166.924 kg (368 lb), last menstrual period 01/19/2012. GENERAL: Well-developed, well-nourished female in no acute distress.  HEENT: Normocephalic HEART: normal rate RESP: normal effort ABDOMEN: Soft, non-tender EXTREMITIES: Nontender, no edema NEURO: Alert and oriented Pelvic exam: Cervix pink, visually closed, without lesion, scant white creamy discharge, scant blood noted at cervical os, vaginal walls and external genitalia normal Bimanual exam: Cervix 0/long/high, firm, anterior, neg CMT, uterus mildly tender, nonenlarged, adnexa without tenderness, enlargement, or mass  LAB RESULTS Results for orders placed during the hospital encounter of 03/06/12 (from the past 24 hour(s))  URINALYSIS, ROUTINE W REFLEX MICROSCOPIC     Status: Abnormal   Collection Time   03/06/12  7:41 PM      Component Value Range   Color, Urine YELLOW  YELLOW   APPearance CLEAR  CLEAR   Specific Gravity, Urine >1.030 (*) 1.005 - 1.030   pH 5.5  5.0 - 8.0   Glucose, UA NEGATIVE  NEGATIVE mg/dL   Hgb urine dipstick NEGATIVE  NEGATIVE   Bilirubin Urine NEGATIVE  NEGATIVE   Ketones, ur NEGATIVE  NEGATIVE mg/dL   Protein, ur NEGATIVE  NEGATIVE mg/dL   Urobilinogen, UA 0.2  0.0 - 1.0 mg/dL   Nitrite NEGATIVE  NEGATIVE   Leukocytes, UA NEGATIVE  NEGATIVE  POCT PREGNANCY, URINE     Status: Normal   Collection Time   03/06/12  7:50 PM      Component Value Range   Preg Test, Ur NEGATIVE  NEGATIVE  WET PREP, GENITAL     Status: Abnormal   Collection Time   03/06/12  8:50 PM      Component Value Range   Yeast Wet Prep HPF POC NONE SEEN  NONE SEEN   Trich, Wet Prep NONE SEEN  NONE SEEN   Clue Cells Wet Prep HPF  POC NONE SEEN  NONE SEEN   WBC, Wet Prep HPF POC MODERATE (*) NONE SEEN  CBC     Status: Abnormal   Collection Time   03/06/12  9:05 PM      Component Value Range   WBC 10.0  4.0 - 10.5 K/uL   RBC 4.26  3.87 - 5.11 MIL/uL   Hemoglobin 8.8 (*) 12.0 - 15.0 g/dL   HCT 82.9 (*) 56.2 - 13.0 %   MCV 70.4 (*) 78.0 - 100.0 fL   MCH 20.7 (*) 26.0 - 34.0 pg   MCHC 29.3 (*) 30.0 - 36.0 g/dL   RDW 86.5 (*) 78.4 - 69.6 %   Platelets 378  150 - 400 K/uL  HCG, QUANTITATIVE, PREGNANCY     Status: Normal   Collection Time   03/06/12  9:05 PM      Component Value Range   hCG, Beta Chain, Quant, S <1  <5 mIU/mL    ASSESSMENT 1. Metrorrhagia   2. Dysmenorrhea   3. Anemia     PLAN Discharge home Message sent to Gyn clinic for pt to f/u Integra prescribed b/c pt having GI side effects and hemoglobin unchanged with ferrous sulfate Return to MAU as needed       Follow-up Information    Follow up with [facility]. (The gyn clinic will call you or call the number listed above.  Return to MAU as needed.)    Contact information:   [phone]         Sharen Counter Certified Nurse-Midwife 03/06/2012  10:22 PM

## 2012-03-07 LAB — GC/CHLAMYDIA PROBE AMP, GENITAL: Chlamydia, DNA Probe: NEGATIVE

## 2012-03-08 DIAGNOSIS — D649 Anemia, unspecified: Secondary | ICD-10-CM | POA: Diagnosis present

## 2012-03-09 NOTE — MAU Provider Note (Signed)
Attestation of Attending Supervision of Advanced Practitioner (CNM/NP): Evaluation and management procedures were performed by the Advanced Practitioner under my supervision and collaboration.  I have reviewed the Advanced Practitioner's note and chart, and I agree with the management and plan.  HARRAWAY-SMITH, Dodi Leu 7:23 PM     

## 2012-04-24 ENCOUNTER — Encounter: Payer: Medicaid Other | Admitting: Obstetrics & Gynecology

## 2012-05-14 ENCOUNTER — Encounter: Payer: Medicaid Other | Admitting: Obstetrics & Gynecology

## 2012-09-21 ENCOUNTER — Encounter (HOSPITAL_COMMUNITY): Payer: Self-pay | Admitting: Emergency Medicine

## 2012-09-21 ENCOUNTER — Emergency Department (HOSPITAL_COMMUNITY)
Admission: EM | Admit: 2012-09-21 | Discharge: 2012-09-22 | Disposition: A | Discharge status: Home / Self Care | Payer: Medicaid Other | Attending: Emergency Medicine | Admitting: Emergency Medicine

## 2012-09-21 DIAGNOSIS — Z8659 Personal history of other mental and behavioral disorders: Secondary | ICD-10-CM | POA: Insufficient documentation

## 2012-09-21 DIAGNOSIS — G8929 Other chronic pain: Secondary | ICD-10-CM | POA: Insufficient documentation

## 2012-09-21 DIAGNOSIS — Z87442 Personal history of urinary calculi: Secondary | ICD-10-CM | POA: Insufficient documentation

## 2012-09-21 DIAGNOSIS — M5416 Radiculopathy, lumbar region: Secondary | ICD-10-CM

## 2012-09-21 DIAGNOSIS — F17200 Nicotine dependence, unspecified, uncomplicated: Secondary | ICD-10-CM | POA: Insufficient documentation

## 2012-09-21 DIAGNOSIS — Z87440 Personal history of urinary (tract) infections: Secondary | ICD-10-CM | POA: Insufficient documentation

## 2012-09-21 DIAGNOSIS — Z8679 Personal history of other diseases of the circulatory system: Secondary | ICD-10-CM | POA: Insufficient documentation

## 2012-09-21 DIAGNOSIS — IMO0002 Reserved for concepts with insufficient information to code with codable children: Secondary | ICD-10-CM | POA: Insufficient documentation

## 2012-09-21 NOTE — ED Notes (Addendum)
Pt c/o back pain radiating from neck to feet, pain states she does feel numbness to feet, denies injury

## 2012-09-22 MED ORDER — HYDROMORPHONE HCL PF 1 MG/ML IJ SOLN
1.0000 mg | Freq: Once | INTRAMUSCULAR | Status: AC
  Administered 2012-09-22: 1 mg via INTRAMUSCULAR
  Filled 2012-09-22: qty 1

## 2012-09-22 MED ORDER — MELOXICAM 7.5 MG PO TABS: 7.5000 mg | ORAL_TABLET | Freq: Every day | ORAL | Status: DC

## 2012-09-22 MED ORDER — PREDNISONE 20 MG PO TABS: 40.0000 mg | ORAL_TABLET | Freq: Every day | ORAL | Status: DC

## 2012-09-22 MED ORDER — ONDANSETRON 4 MG PO TBDP
4.0000 mg | ORAL_TABLET | Freq: Once | ORAL | Status: AC
  Administered 2012-09-22: 4 mg via ORAL
  Filled 2012-09-22: qty 1

## 2012-09-22 MED ORDER — PREDNISONE 20 MG PO TABS
60.0000 mg | ORAL_TABLET | Freq: Once | ORAL | Status: AC
  Administered 2012-09-22: 60 mg via ORAL
  Filled 2012-09-22: qty 3

## 2012-09-22 MED ORDER — OXYCODONE-ACETAMINOPHEN 5-325 MG PO TABS: ORAL_TABLET | ORAL | Status: DC

## 2012-09-22 NOTE — ED Provider Notes (Signed)
History     CSN: [id number]  Arrival date & time 09/21/12  2140   First MD Initiated Contact with Patient 09/22/12 0000      Chief Complaint  Patient presents with  . Back Pain    (Consider location/radiation/quality/duration/timing/severity/associated sxs/prior treatment) HPI  [name] is a 27 y.o. female complaining of exacerbation of chronic low back pain. Pain radiates down the right leg past the knee. Patient denies fever, change in bowel or bladder habits, history of IV drug use, history of cancer, difficulty ambulating greater pain is severe, it is exacerbated by certain positions. Pain is keeping her from sleeping at night.   Past Medical History  Diagnosis Date  . Depression   . Bipolar 1 disorder, depressed   . PTSD (post-traumatic stress disorder)   . Kidney stone   . Migraine   . Urinary tract infection   . Pregnancy induced hypertension     Past Surgical History  Procedure Laterality Date  . Kidney stones    . Lipotripsy    . No past surgeries      Family History  Problem Relation Age of Onset  . Rheum arthritis Mother   . Asthma Mother   . Hypertension Mother   . Other Neg Hx     History  Substance Use Topics  . Smoking status: Current Every Day Smoker -- 0.25 packs/day  . Smokeless tobacco: Never Used  . Alcohol Use: No    OB History   Grav Para Term Preterm Abortions TAB SAB Ect Mult Living   3 2 2  1  1   2       Review of Systems  Constitutional: Negative for fever.  HENT: Negative for neck pain.   Gastrointestinal: Negative for nausea, vomiting and abdominal pain.  Genitourinary: Negative for dysuria and difficulty urinating.  Musculoskeletal: Positive for back pain.  Neurological: Negative for weakness and numbness.    Allergies  Darvocet; Sulfa antibiotics; and Tomato  Home Medications   Current Outpatient Rx  Name  Route  Sig  Dispense  Refill  . acetaminophen (TYLENOL) 500 MG tablet   Oral   Take 500 mg by mouth  every 6 (six) hours as needed. For pain         . Fe Fum-FePoly-Vit C-Vit B3 (INTEGRA) 62.5-62.5-40-3 MG CAPS   Oral   Take 1 tablet by mouth daily.   30 capsule   5   . HYDROcodone-acetaminophen (LORCET) 10-650 MG per tablet   Oral   Take 1 tablet by mouth every 6 (six) hours as needed. For pain           BP 144/85  Pulse 89  Temp(Src) 98.7 F (37.1 C) (Oral)  Resp 19  Ht 5\' 7"  (1.702 m)  Wt 297 lb (134.718 kg)  BMI 46.51 kg/m2  SpO2 97%  LMP 09/14/2012  Physical Exam  Nursing note and vitals reviewed. Constitutional: She is oriented to person, place, and time. She appears well-developed and well-nourished. No distress.  Morbidly obese.   HENT:  Head: Normocephalic.  Mouth/Throat: Oropharynx is clear and moist.  Eyes: Conjunctivae and EOM are normal. Pupils are equal, round, and reactive to light.  Cardiovascular: Normal rate.   Pulmonary/Chest: Effort normal. No stridor.  Musculoskeletal: Normal range of motion.  Neurological: She is alert and oriented to person, place, and time.  Strength is 5 out of 5x4 extremities, distal sensation is intact, strength leg raise is positive on ipsilateral (right side at 10, straight  leg raise is positive on the contralateral side at 20  Psychiatric: She has a normal mood and affect.    ED Course  Procedures (including critical care time)  Labs Reviewed - No data to display No results found.   1. Lumbar radiculopathy       MDM   [name] is a 27 y.o. female with severe lumbar radiculopathy and no neurological abnormalities. Patient has no red flags is able to inability without difficulty.   Filed Vitals:   09/21/12 2157  BP: 144/85  Pulse: 89  Temp: 98.7 F (37.1 C)  TempSrc: Oral  Resp: 19  Height: 5\' 7"  (1.702 m)  Weight: 297 lb (134.718 kg)  SpO2: 97%     Pt verbalized understanding and agrees with care plan. Outpatient follow-up and return precautions given.    New Prescriptions   MELOXICAM  (MOBIC) 7.5 MG TABLET    Take 1 tablet (7.5 mg total) by mouth daily.   OXYCODONE-ACETAMINOPHEN (PERCOCET/ROXICET) 5-325 MG PER TABLET    1 to 2 tabs PO q6hrs  PRN for pain   PREDNISONE (DELTASONE) 20 MG TABLET    Take 2 tablets (40 mg total) by mouth daily.           Laura Emery, PA-C 09/22/12 0045

## 2012-09-23 NOTE — ED Provider Notes (Signed)
Medical screening examination/treatment/procedure(s) were performed by non-physician practitioner and as supervising physician I was immediately available for consultation/collaboration.  Sunnie Nielsen, MD 09/23/12 [phone]

## 2012-09-25 ENCOUNTER — Emergency Department (HOSPITAL_COMMUNITY)
Admission: EM | Admit: 2012-09-25 | Discharge: 2012-09-25 | Disposition: A | Discharge status: Home / Self Care | Payer: Medicaid Other | Attending: Emergency Medicine | Admitting: Emergency Medicine

## 2012-09-25 ENCOUNTER — Encounter (HOSPITAL_COMMUNITY): Payer: Self-pay | Admitting: Emergency Medicine

## 2012-09-25 DIAGNOSIS — F4310 Post-traumatic stress disorder, unspecified: Secondary | ICD-10-CM | POA: Insufficient documentation

## 2012-09-25 DIAGNOSIS — M549 Dorsalgia, unspecified: Secondary | ICD-10-CM

## 2012-09-25 DIAGNOSIS — Z8679 Personal history of other diseases of the circulatory system: Secondary | ICD-10-CM | POA: Insufficient documentation

## 2012-09-25 DIAGNOSIS — Z87440 Personal history of urinary (tract) infections: Secondary | ICD-10-CM | POA: Insufficient documentation

## 2012-09-25 DIAGNOSIS — Z87442 Personal history of urinary calculi: Secondary | ICD-10-CM | POA: Insufficient documentation

## 2012-09-25 DIAGNOSIS — M545 Low back pain, unspecified: Secondary | ICD-10-CM | POA: Insufficient documentation

## 2012-09-25 DIAGNOSIS — F17200 Nicotine dependence, unspecified, uncomplicated: Secondary | ICD-10-CM | POA: Insufficient documentation

## 2012-09-25 DIAGNOSIS — F319 Bipolar disorder, unspecified: Secondary | ICD-10-CM | POA: Insufficient documentation

## 2012-09-25 DIAGNOSIS — R209 Unspecified disturbances of skin sensation: Secondary | ICD-10-CM | POA: Insufficient documentation

## 2012-09-25 DIAGNOSIS — F411 Generalized anxiety disorder: Secondary | ICD-10-CM | POA: Insufficient documentation

## 2012-09-25 DIAGNOSIS — Z9181 History of falling: Secondary | ICD-10-CM | POA: Insufficient documentation

## 2012-09-25 DIAGNOSIS — Z79899 Other long term (current) drug therapy: Secondary | ICD-10-CM | POA: Insufficient documentation

## 2012-09-25 MED ORDER — OXYCODONE-ACETAMINOPHEN 5-325 MG PO TABS: 2.0000 | ORAL_TABLET | ORAL | Status: DC | PRN

## 2012-09-25 MED ORDER — PROMETHAZINE HCL 12.5 MG PO TABS: 12.5000 mg | ORAL_TABLET | Freq: Four times a day (QID) | ORAL | Status: DC | PRN

## 2012-09-25 MED ORDER — METHOCARBAMOL 500 MG PO TABS: 500.0000 mg | ORAL_TABLET | Freq: Two times a day (BID) | ORAL | Status: DC

## 2012-09-25 NOTE — ED Provider Notes (Signed)
History     CSN: [id number]  Arrival date & time 09/25/12  1100   First MD Initiated Contact with Patient 09/25/12 1116      No chief complaint on file.   (Consider location/radiation/quality/duration/timing/severity/associated sxs/prior treatment) HPI Comments: This is a 27 year old female who presents today with unresolved back pain. She has been evaluated previously and this emergency department and with the orthopedist this morning. She expresses frustration and feels as though the orthopedist does not listen to her. She has an MRI in 2 weeks for her back pain. She was told she has nerve damage. She states she is still in significant amount of pain in the orthopedist did not address this. The pain is in her low back and radiates into the her legs bilaterally. She has associated right arm tingling and numbness. The orthopedist is aware of this. She was seen here 3 days ago and was given prednisone, Percocet. She states she has been having frequent falls due to her pain and her leg just giving out. No fever, chills, vomiting, abdominal pain, bowel or bladder incontinence, shortness of breath, chest pain. She states she knows her weight is an issue and that she has been trying to lose weight. She states despite the fact that she is still overweight she has lost 100 pounds.  The history is provided by the patient. No language interpreter was used.    Past Medical History  Diagnosis Date  . Depression   . Bipolar 1 disorder, depressed   . PTSD (post-traumatic stress disorder)   . Kidney stone   . Migraine   . Urinary tract infection   . Pregnancy induced hypertension     Past Surgical History  Procedure Laterality Date  . Kidney stones    . Lipotripsy    . No past surgeries      Family History  Problem Relation Age of Onset  . Rheum arthritis Mother   . Asthma Mother   . Hypertension Mother   . Other Neg Hx     History  Substance Use Topics  . Smoking status: Current  Every Day Smoker -- 0.25 packs/day  . Smokeless tobacco: Never Used  . Alcohol Use: No    OB History   Grav Para Term Preterm Abortions TAB SAB Ect Mult Living   3 2 2  1  1   2       Review of Systems  Constitutional: Negative for fever and chills.  Respiratory: Negative for shortness of breath.   Cardiovascular: Negative for chest pain.  Musculoskeletal: Positive for back pain.  All other systems reviewed and are negative.    Allergies  Darvocet; Sulfa antibiotics; and Tomato  Home Medications   Current Outpatient Rx  Name  Route  Sig  Dispense  Refill  . ARIPiprazole (ABILIFY) 20 MG tablet   Oral   Take 20 mg by mouth daily.         . clonazePAM (KLONOPIN) 1 MG tablet   Oral   Take 1 mg by mouth 2 (two) times daily.         Marland Kitchen HYDROcodone-acetaminophen (NORCO) 10-325 MG per tablet   Oral   Take 1 tablet by mouth every 6 (six) hours as needed for pain.         . meloxicam (MOBIC) 7.5 MG tablet   Oral   Take 1 tablet (7.5 mg total) by mouth daily.   15 tablet   0   . predniSONE (DELTASONE) 20 MG tablet  Oral   Take 2 tablets (40 mg total) by mouth daily.   10 tablet   0   . venlafaxine XR (EFFEXOR-XR) 75 MG 24 hr capsule   Oral   Take 75 mg by mouth daily.           BP 136/86  Pulse 96  Temp(Src) 98.3 F (36.8 C) (Oral)  Resp 20  SpO2 98%  LMP 09/14/2012  Physical Exam  Nursing note and vitals reviewed. Constitutional: She is oriented to person, place, and time. She appears well-developed and well-nourished. She does not appear ill. No distress.  Morbidly obese  HENT:  Head: Normocephalic and atraumatic.  Right Ear: External ear normal.  Left Ear: External ear normal.  Nose: Nose normal.  Mouth/Throat: Oropharynx is clear and moist.  Eyes: Conjunctivae are normal.  Neck: Normal range of motion.  Cardiovascular: Normal rate, regular rhythm and normal heart sounds.   Pulmonary/Chest: Effort normal and breath sounds normal. No  stridor. No respiratory distress. She has no wheezes. She has no rales.  Abdominal: Soft. She exhibits no distension.  Musculoskeletal: Normal range of motion.  Tender to palpation diffusely over her back No step-offs, deformities - she was imaged earlier this morning  Neurological: She is alert and oriented to person, place, and time. She has normal strength.  Skin: Skin is warm and dry. She is not diaphoretic. No erythema.  Psychiatric: Her behavior is normal. Her mood appears anxious.  tearful    ED Course  Procedures (including critical care time)  Labs Reviewed - No data to display No results found.   1. Back pain       MDM  This is a 27 year old female who presents today with back pain. She has been evaluated in this emergency department for this problem as well as seeing an orthopedist this morning. She is an MRI scheduled for 2 weeks from today. She was given a prescription for a walker to 2 frequent falls because of pain. No indication for repeat imaging. No red flags including bowel or bladder incontinence, IV drug abuse, history of cancer. She will followup with her orthopedist that she saw this morning. Rx for robaxin and norco given for pain control. Return precautions given. Vital signs stable for discharge.Patient / Family / Caregiver informed of clinical course, understand medical decision-making process, and agree with plan.        Mora Bellman, PA-C 09/25/12 1546

## 2012-09-25 NOTE — ED Notes (Signed)
Pt was seen at [facility] 2 weeks ago for neck and back pain. Was given referral to [facility]. Saw Dr. Luiz Blare and an MRI is ordered for 2 weeks from now. Pt here today because the Vicodin 1 every 6 hrs is not controlling her pain.

## 2012-09-25 NOTE — ED Provider Notes (Signed)
Medical screening examination/treatment/procedure(s) were performed by non-physician practitioner and as supervising physician I was immediately available for consultation/collaboration.  Raeford Razor, MD 09/25/12 [phone]

## 2012-10-10 ENCOUNTER — Encounter (HOSPITAL_COMMUNITY): Payer: Self-pay

## 2012-10-10 ENCOUNTER — Emergency Department (HOSPITAL_COMMUNITY)
Admission: EM | Admit: 2012-10-10 | Discharge: 2012-10-10 | Disposition: A | Discharge status: Home / Self Care | Payer: Medicaid Other | Attending: Emergency Medicine | Admitting: Emergency Medicine

## 2012-10-10 DIAGNOSIS — Z87440 Personal history of urinary (tract) infections: Secondary | ICD-10-CM | POA: Insufficient documentation

## 2012-10-10 DIAGNOSIS — F4310 Post-traumatic stress disorder, unspecified: Secondary | ICD-10-CM | POA: Insufficient documentation

## 2012-10-10 DIAGNOSIS — Z87442 Personal history of urinary calculi: Secondary | ICD-10-CM | POA: Insufficient documentation

## 2012-10-10 DIAGNOSIS — F329 Major depressive disorder, single episode, unspecified: Secondary | ICD-10-CM | POA: Insufficient documentation

## 2012-10-10 DIAGNOSIS — Z79899 Other long term (current) drug therapy: Secondary | ICD-10-CM | POA: Insufficient documentation

## 2012-10-10 DIAGNOSIS — IMO0002 Reserved for concepts with insufficient information to code with codable children: Secondary | ICD-10-CM | POA: Insufficient documentation

## 2012-10-10 DIAGNOSIS — M25559 Pain in unspecified hip: Secondary | ICD-10-CM | POA: Insufficient documentation

## 2012-10-10 DIAGNOSIS — Z8742 Personal history of other diseases of the female genital tract: Secondary | ICD-10-CM | POA: Insufficient documentation

## 2012-10-10 DIAGNOSIS — F17200 Nicotine dependence, unspecified, uncomplicated: Secondary | ICD-10-CM | POA: Insufficient documentation

## 2012-10-10 DIAGNOSIS — M5416 Radiculopathy, lumbar region: Secondary | ICD-10-CM

## 2012-10-10 DIAGNOSIS — Z8679 Personal history of other diseases of the circulatory system: Secondary | ICD-10-CM | POA: Insufficient documentation

## 2012-10-10 DIAGNOSIS — F3130 Bipolar disorder, current episode depressed, mild or moderate severity, unspecified: Secondary | ICD-10-CM | POA: Insufficient documentation

## 2012-10-10 DIAGNOSIS — F3289 Other specified depressive episodes: Secondary | ICD-10-CM | POA: Insufficient documentation

## 2012-10-10 MED ORDER — HYDROMORPHONE HCL PF 2 MG/ML IJ SOLN
2.0000 mg | Freq: Once | INTRAMUSCULAR | Status: AC
  Administered 2012-10-10: 2 mg via INTRAMUSCULAR
  Filled 2012-10-10: qty 1

## 2012-10-10 MED ORDER — DEXAMETHASONE SODIUM PHOSPHATE 10 MG/ML IJ SOLN
10.0000 mg | Freq: Once | INTRAMUSCULAR | Status: DC
  Filled 2012-10-10: qty 1

## 2012-10-10 MED ORDER — DIAZEPAM 5 MG PO TABS
5.0000 mg | ORAL_TABLET | Freq: Once | ORAL | Status: AC
  Administered 2012-10-10: 5 mg via ORAL
  Filled 2012-10-10: qty 1

## 2012-10-10 MED ORDER — ONDANSETRON HCL 4 MG PO TABS: 8.0000 mg | ORAL_TABLET | Freq: Once | ORAL | Status: DC

## 2012-10-10 MED ORDER — ONDANSETRON 8 MG PO TBDP
8.0000 mg | ORAL_TABLET | Freq: Once | ORAL | Status: AC
Start: 2012-10-10 — End: 2012-10-10
  Administered 2012-10-10: 8 mg via ORAL
  Filled 2012-10-10: qty 1

## 2012-10-10 MED ORDER — DEXAMETHASONE SODIUM PHOSPHATE 10 MG/ML IJ SOLN
10.0000 mg | Freq: Once | INTRAMUSCULAR | Status: AC
Start: 2012-10-10 — End: 2012-10-10
  Administered 2012-10-10: 10 mg via INTRAMUSCULAR

## 2012-10-10 MED ORDER — CYCLOBENZAPRINE HCL 10 MG PO TABS: 10.0000 mg | ORAL_TABLET | Freq: Two times a day (BID) | ORAL | Status: DC | PRN

## 2012-10-10 MED ORDER — HYDROCODONE-ACETAMINOPHEN 10-325 MG PO TABS: 1.0000 | ORAL_TABLET | Freq: Four times a day (QID) | ORAL | Status: DC | PRN

## 2012-10-10 NOTE — ED Notes (Signed)
Pt complains of low back pain that radiates to her tailbone and right hip. She has a pending MRI appt but it hasn't been scheduled. Pain worsen over the day.

## 2012-10-10 NOTE — ED Provider Notes (Signed)
  Medical screening examination/treatment/procedure(s) were performed by non-physician practitioner and as supervising physician I was immediately available for consultation/collaboration.    Leyli Kevorkian, MD 10/10/12 0709 

## 2012-10-10 NOTE — ED Provider Notes (Signed)
History     CSN: [id number]  Arrival date & time 10/10/12  4098   First MD Initiated Contact with Patient 10/10/12 0309      Chief Complaint  Patient presents with  . Back Pain  . Hip Pain    (Consider location/radiation/quality/duration/timing/severity/associated sxs/prior treatment) HPI [name] is a 27 y.o. female who presents to ED with complaint of lower right sided back pain which radiates into right hip and right thigh. States hx of the same, pain is chronic. Has been followed by Dr.Graves, ortho, and Dr. Mayford Knife, her PCP. States on Norco 10mg  at home with no relief. States tonight pain became more severe and she was unable to tolerate it. States next apt is May 12th, and was told she will have MRI and start injections. Pt denies fever, abdominal pain, numbness or weakness in extremities, loss of bowels or urinary problems.    Past Medical History  Diagnosis Date  . Depression   . Bipolar 1 disorder, depressed   . PTSD (post-traumatic stress disorder)   . Kidney stone   . Migraine   . Urinary tract infection   . Pregnancy induced hypertension     Past Surgical History  Procedure Laterality Date  . Kidney stones    . Lipotripsy    . No past surgeries      Family History  Problem Relation Age of Onset  . Rheum arthritis Mother   . Asthma Mother   . Hypertension Mother   . Other Neg Hx     History  Substance Use Topics  . Smoking status: Current Every Day Smoker -- 0.25 packs/day  . Smokeless tobacco: Never Used  . Alcohol Use: No    OB History   Grav Para Term Preterm Abortions TAB SAB Ect Mult Living   3 2 2  1  1   2       Review of Systems  Constitutional: Negative for fever and chills.  Respiratory: Negative.   Cardiovascular: Negative.   Gastrointestinal: Negative.   Musculoskeletal: Positive for back pain.  Skin: Negative.   Neurological: Negative for weakness and numbness.    Allergies  Darvocet; Sulfa antibiotics; and  Tomato  Home Medications   Current Outpatient Rx  Name  Route  Sig  Dispense  Refill  . ARIPiprazole (ABILIFY) 20 MG tablet   Oral   Take 20 mg by mouth every morning.          . clonazePAM (KLONOPIN) 1 MG tablet   Oral   Take 1 mg by mouth 2 (two) times daily.         Marland Kitchen HYDROcodone-acetaminophen (NORCO) 10-325 MG per tablet   Oral   Take 1 tablet by mouth every 6 (six) hours as needed for pain.         Marland Kitchen ibuprofen (ADVIL,MOTRIN) 200 MG tablet   Oral   Take 600 mg by mouth every 6 (six) hours as needed for pain or headache.         . oxyCODONE-acetaminophen (PERCOCET/ROXICET) 5-325 MG per tablet   Oral   Take 2 tablets by mouth every 4 (four) hours as needed for pain.   15 tablet   0   . venlafaxine XR (EFFEXOR-XR) 75 MG 24 hr capsule   Oral   Take 75 mg by mouth every morning.          . predniSONE (DELTASONE) 20 MG tablet   Oral   Take 2 tablets (40 mg total) by mouth  daily.   10 tablet   0     BP 143/79  Pulse 96  Temp(Src) 97.8 F (36.6 C) (Oral)  Resp 20  Ht 5\' 7"  (1.702 m)  Wt 296 lb (134.265 kg)  BMI 46.35 kg/m2  SpO2 97%  LMP 09/14/2012  Physical Exam  Nursing note and vitals reviewed. Constitutional: She appears well-developed and well-nourished.  Morbidly obese  HENT:  Head: Normocephalic.  Cardiovascular: Normal rate, regular rhythm and normal heart sounds.   Pulmonary/Chest: Effort normal and breath sounds normal. No respiratory distress. She has no wheezes.  Musculoskeletal:  Midline and right paravertebral tenderness in the lumbar spine. Pain with right leg raise.   Neurological: She is alert.  5/5 and equal right lower extremity strength. Good strength against resistance with right foot dorsiflexion and plantarflexion.   Skin: Skin is warm and dry.    ED Course  Procedures (including critical care time)  No results found.    1. Lumbar radiculopathy       MDM  PT with chronic back pain. She is here with worsening  pain, and stats ran out of her pain medications. While in ED, treated with 5mg  of valium PO, 10mg  of decadron IM, total of 4mg  of dilaudid IM with no improvement. Pt now wanting to leave, stating she has to get back to her daughter. She is neurovascularly intact. No signs of cauda equina. No fever. She called her PCP from ER and left a message to get an apt. Will d/c home, give vicodin 10mg  (her regular dose) and flexeril for spasms. Follow up with pcp.   Filed Vitals:   10/10/12 0201 10/10/12 0311  BP: 118/73 143/79  Pulse: 83 96  Temp: 98.1 F (36.7 C) 97.8 F (36.6 C)  TempSrc: Oral Oral  Resp: 20   Height: 5\' 7"  (1.702 m)   Weight: 296 lb (134.265 kg)   SpO2: 97% 97%           Myriam Jacobson Denina Rieger, PA-C 10/10/12 0559

## 2013-01-04 ENCOUNTER — Emergency Department (HOSPITAL_COMMUNITY): Payer: Medicaid Other

## 2013-01-04 ENCOUNTER — Encounter (HOSPITAL_COMMUNITY): Payer: Self-pay | Admitting: *Deleted

## 2013-01-04 ENCOUNTER — Emergency Department (HOSPITAL_COMMUNITY)
Admission: EM | Admit: 2013-01-04 | Discharge: 2013-01-04 | Disposition: A | Discharge status: Home / Self Care | Payer: Medicaid Other | Attending: Emergency Medicine | Admitting: Emergency Medicine

## 2013-01-04 DIAGNOSIS — Z87442 Personal history of urinary calculi: Secondary | ICD-10-CM | POA: Insufficient documentation

## 2013-01-04 DIAGNOSIS — R609 Edema, unspecified: Secondary | ICD-10-CM | POA: Insufficient documentation

## 2013-01-04 DIAGNOSIS — G43909 Migraine, unspecified, not intractable, without status migrainosus: Secondary | ICD-10-CM | POA: Insufficient documentation

## 2013-01-04 DIAGNOSIS — Z79899 Other long term (current) drug therapy: Secondary | ICD-10-CM | POA: Insufficient documentation

## 2013-01-04 DIAGNOSIS — Z87440 Personal history of urinary (tract) infections: Secondary | ICD-10-CM | POA: Insufficient documentation

## 2013-01-04 DIAGNOSIS — G8929 Other chronic pain: Secondary | ICD-10-CM | POA: Insufficient documentation

## 2013-01-04 DIAGNOSIS — R509 Fever, unspecified: Secondary | ICD-10-CM | POA: Insufficient documentation

## 2013-01-04 DIAGNOSIS — Z8679 Personal history of other diseases of the circulatory system: Secondary | ICD-10-CM | POA: Insufficient documentation

## 2013-01-04 DIAGNOSIS — Z3202 Encounter for pregnancy test, result negative: Secondary | ICD-10-CM | POA: Insufficient documentation

## 2013-01-04 DIAGNOSIS — F17200 Nicotine dependence, unspecified, uncomplicated: Secondary | ICD-10-CM | POA: Insufficient documentation

## 2013-01-04 DIAGNOSIS — R51 Headache: Secondary | ICD-10-CM | POA: Insufficient documentation

## 2013-01-04 DIAGNOSIS — F3130 Bipolar disorder, current episode depressed, mild or moderate severity, unspecified: Secondary | ICD-10-CM | POA: Insufficient documentation

## 2013-01-04 DIAGNOSIS — M549 Dorsalgia, unspecified: Secondary | ICD-10-CM | POA: Insufficient documentation

## 2013-01-04 LAB — CBC WITH DIFFERENTIAL/PLATELET
Basophils Absolute: 0 10*3/uL (ref 0.0–0.1)
Basophils Relative: 0 % (ref 0–1)
Eosinophils Absolute: 0.2 10*3/uL (ref 0.0–0.7)
Hemoglobin: 11.4 g/dL — ABNORMAL LOW (ref 12.0–15.0)
MCH: 24.2 pg — ABNORMAL LOW (ref 26.0–34.0)
MCHC: 31.2 g/dL (ref 30.0–36.0)
Monocytes Relative: 5 % (ref 3–12)
Neutro Abs: 8.1 10*3/uL — ABNORMAL HIGH (ref 1.7–7.7)
Neutrophils Relative %: 70 % (ref 43–77)
Platelets: 241 10*3/uL (ref 150–400)

## 2013-01-04 LAB — POCT PREGNANCY, URINE: Preg Test, Ur: NEGATIVE

## 2013-01-04 LAB — URINALYSIS, ROUTINE W REFLEX MICROSCOPIC
Glucose, UA: NEGATIVE mg/dL
Ketones, ur: NEGATIVE mg/dL
Leukocytes, UA: NEGATIVE
Protein, ur: NEGATIVE mg/dL
Urobilinogen, UA: 1 mg/dL (ref 0.0–1.0)

## 2013-01-04 LAB — BASIC METABOLIC PANEL
BUN: 9 mg/dL (ref 6–23)
Chloride: 101 mEq/L (ref 96–112)
GFR calc Af Amer: >90 mL/min (ref >90)
GFR calc non Af Amer: >90 mL/min (ref >90)
Potassium: 3.4 mEq/L — ABNORMAL LOW (ref 3.5–5.1)
Sodium: 135 mEq/L (ref 135–145)

## 2013-01-04 LAB — CG4 I-STAT (LACTIC ACID): Lactic Acid, Venous: 1.64 mmol/L (ref 0.5–2.2)

## 2013-01-04 MED ORDER — SODIUM CHLORIDE 0.9 % IV BOLUS (SEPSIS): 1000.0000 mL | Freq: Once | INTRAVENOUS | Status: DC

## 2013-01-04 MED ORDER — SODIUM CHLORIDE 0.9 % IV BOLUS (SEPSIS)
2000.0000 mL | Freq: Once | INTRAVENOUS | Status: AC
  Administered 2013-01-04: 2000 mL via INTRAVENOUS

## 2013-01-04 MED ORDER — HYDROMORPHONE HCL PF 1 MG/ML IJ SOLN
1.0000 mg | Freq: Once | INTRAMUSCULAR | Status: AC
  Administered 2013-01-04: 1 mg via INTRAVENOUS
  Filled 2013-01-04: qty 1

## 2013-01-04 MED ORDER — SODIUM CHLORIDE 0.9 % IV SOLN
INTRAVENOUS | Status: DC
  Administered 2013-01-04: 19:00:00 via INTRAVENOUS

## 2013-01-04 MED ORDER — OXYCODONE-ACETAMINOPHEN 5-325 MG PO TABS: 2.0000 | ORAL_TABLET | ORAL | Status: DC | PRN

## 2013-01-04 MED ORDER — METOCLOPRAMIDE HCL 10 MG PO TABS: 10.0000 mg | ORAL_TABLET | Freq: Four times a day (QID) | ORAL | Status: DC | PRN

## 2013-01-04 MED ORDER — METOCLOPRAMIDE HCL 5 MG/ML IJ SOLN
10.0000 mg | Freq: Once | INTRAMUSCULAR | Status: AC
  Administered 2013-01-04: 10 mg via INTRAVENOUS
  Filled 2013-01-04: qty 2

## 2013-01-04 MED ORDER — ONDANSETRON HCL 4 MG/2ML IJ SOLN
4.0000 mg | Freq: Once | INTRAMUSCULAR | Status: AC
  Administered 2013-01-04: 4 mg via INTRAVENOUS
  Filled 2013-01-04: qty 2

## 2013-01-04 NOTE — ED Notes (Signed)
Pt reports c/o of headache, pain shoots down spine into legs. Right leg hurts more than left. Bilateral swelling to legs/feet. Pt states pain shoots down and legs become numb. Pain started 4 days ago. Hx of sciatica. +nausea

## 2013-01-04 NOTE — ED Notes (Signed)
Pt states she took vicodin 1 hour ago, still in pain

## 2013-01-04 NOTE — ED Provider Notes (Signed)
CSN: [id number]     Arrival date & time 01/04/13  1707 History     First MD Initiated Contact with Patient 01/04/13 1738     Chief Complaint  Patient presents with  . Leg Swelling  . Headache   (Consider location/radiation/quality/duration/timing/severity/associated sxs/prior Treatment) HPI This 27 year old female complains of a four-day history of fevers up to 104 with worse than usual severe back pain. She states at baseline she has severe low back pain but now has more than severe low back pain as well as upper back pain neck pain without trauma for the last few days. She has intermittent mild gradual onset headache but does not have headache today, she has no history of IV drug abuse or spinal surgery, she is no recent trauma. She denies sore throat runny nose cough chest pain shortness of breath abdominal pain vomiting diarrhea dysuria vaginal bleeding vaginal discharge. She is no rashes. She does have some nausea intermittently for the last few days as well. At baseline she has chronic severe back pain radiating down both legs to the feet with intermittent weakness and numbness to the right leg but no numbness or weakness to the right leg today no numbness or weakness elsewhere. There is no treatment prior to arrival. She had prior normal lumbar MRI 2 years ago and states to her chronic severe pain with radiation down both legs with intermittent weakness and numbness she is supposed to get another MRI of her back. Past Medical History  Diagnosis Date  . Depression   . Bipolar 1 disorder, depressed   . PTSD (post-traumatic stress disorder)   . Kidney stone   . Migraine   . Urinary tract infection   . Pregnancy induced hypertension    Past Surgical History  Procedure Laterality Date  . Kidney stones    . Lipotripsy    . No past surgeries     Family History  Problem Relation Age of Onset  . Rheum arthritis Mother   . Asthma Mother   . Hypertension Mother   . Other Neg Hx     History  Substance Use Topics  . Smoking status: Current Every Day Smoker -- 0.25 packs/day  . Smokeless tobacco: Never Used  . Alcohol Use: No   OB History   Grav Para Term Preterm Abortions TAB SAB Ect Mult Living   3 2 2  1  1   2      Review of Systems 10 Systems reviewed and are negative for acute change except as noted in the HPI. Allergies  Darvocet; Sulfa antibiotics; and Tomato  Home Medications   Current Outpatient Rx  Name  Route  Sig  Dispense  Refill  . ARIPiprazole (ABILIFY) 20 MG tablet   Oral   Take 20 mg by mouth every morning.          . clonazePAM (KLONOPIN) 1 MG tablet   Oral   Take 1 mg by mouth 2 (two) times daily.         Marland Kitchen HYDROcodone-acetaminophen (NORCO) 10-325 MG per tablet   Oral   Take 1 tablet by mouth every 6 (six) hours as needed for pain.         Marland Kitchen ibuprofen (ADVIL,MOTRIN) 200 MG tablet   Oral   Take 600 mg by mouth every 6 (six) hours as needed for pain or headache.         . venlafaxine XR (EFFEXOR-XR) 75 MG 24 hr capsule   Oral   Take  75 mg by mouth every morning.          . metoCLOPramide (REGLAN) 10 MG tablet   Oral   Take 1 tablet (10 mg total) by mouth every 6 (six) hours as needed (nausea/headache).   6 tablet   0   . oxyCODONE-acetaminophen (PERCOCET) 5-325 MG per tablet   Oral   Take 2 tablets by mouth every 4 (four) hours as needed for pain.   10 tablet   0    BP 112/51  Pulse 85  Temp(Src) 99 F (37.2 C) (Oral)  Resp 20  Wt 370 lb (167.831 kg)  BMI 57.94 kg/m2  SpO2 96%  LMP 12/30/2012 Physical Exam  Nursing note and vitals reviewed. Constitutional:  Awake, alert, nontoxic appearance.  HENT:  Head: Atraumatic.  Eyes: Right eye exhibits no discharge. Left eye exhibits no discharge.  Neck: Neck supple.  Mild diffuse posterior neck tenderness with good active flex/extension  Cardiovascular: Regular rhythm.   No murmur heard. Tachycardic  Pulmonary/Chest: Effort normal and breath sounds  normal. No respiratory distress. She has no wheezes. She has no rales. She exhibits no tenderness.  Abdominal: Soft. Bowel sounds are normal. She exhibits no distension. There is no tenderness. There is no rebound and no guarding.  Musculoskeletal: She exhibits edema and tenderness.  Baseline ROM, no obvious new focal weakness. Trace edema to both feet dorsalis pedis pulses intact capillary refill less than 2 seconds normal light touch in both feet. Diffuse tenderness to entire back. Extremities nontender. Normal light touch in all 4 extremities.  Neurological: She is alert.  Mental status and motor strength appears baseline for patient and situation.  Skin: No rash noted.  Psychiatric: She has a normal mood and affect.    ED Course    Procedures (including critical care time) Even though Pt has no history of IVDA or spinal surgery since only symptom along with fever is worse than usual back pain with new neck pain MR ordered to r/o abscess.  Pt developed typical right foot numbness like she gets intermittently for years, rest of right leg without numbness, right foot warm with CR<2secs all toes despite decreased LT in stocking-like distribution of foot only with ankle normal LT, good movement right foot despite decreased LT, Pt with gradual onset headache (not sudden) in ED, no stiff neck, no MRI available today, clinically do not suspect bacterial meningitis/SAH/CVA or acute neuro compromise; Pt has had normal MRI in past for radiation of pain with leg weak/numbness, no change in right foot numbness today from multiple prior spells.  Pt adds she ran out of her chronic Norco 10/325 tabs today so will fill short term Percocet and recommend PCP f/u tomorrow.  Labs Reviewed  URINALYSIS, ROUTINE W REFLEX MICROSCOPIC - Abnormal; Notable for the following:    APPearance CLOUDY (*)    All other components within normal limits  CBC WITH DIFFERENTIAL - Abnormal; Notable for the following:    WBC 11.6  (*)    Hemoglobin 11.4 (*)    MCV 77.5 (*)    MCH 24.2 (*)    RDW 17.5 (*)    Neutro Abs 8.1 (*)    All other components within normal limits  BASIC METABOLIC PANEL - Abnormal; Notable for the following:    Potassium 3.4 (*)    All other components within normal limits  POCT PREGNANCY, URINE  CG4 I-STAT (LACTIC ACID)   No results found. 1. Back pain   2. Chronic back pain  MDM  I doubt any other EMC precluding discharge at this time.  Hurman Horn, MD 01/06/13 2035

## 2013-01-04 NOTE — ED Notes (Signed)
Pt aware of the need for a urine sample. 

## 2013-01-04 NOTE — ED Notes (Signed)
In addition to earlier note, patient reports loss of sensation to right foot and ankle, blurred vision, nausea, and posterior sudden onset headache, radiating to posterior neck.

## 2013-01-08 ENCOUNTER — Emergency Department (HOSPITAL_COMMUNITY)
Admission: EM | Admit: 2013-01-08 | Discharge: 2013-01-08 | Disposition: A | Discharge status: Home / Self Care | Payer: Medicaid Other | Attending: Emergency Medicine | Admitting: Emergency Medicine

## 2013-01-08 ENCOUNTER — Encounter (HOSPITAL_COMMUNITY): Payer: Self-pay

## 2013-01-08 DIAGNOSIS — G8929 Other chronic pain: Secondary | ICD-10-CM | POA: Insufficient documentation

## 2013-01-08 DIAGNOSIS — M542 Cervicalgia: Secondary | ICD-10-CM | POA: Insufficient documentation

## 2013-01-08 DIAGNOSIS — Z8679 Personal history of other diseases of the circulatory system: Secondary | ICD-10-CM | POA: Insufficient documentation

## 2013-01-08 DIAGNOSIS — Z87442 Personal history of urinary calculi: Secondary | ICD-10-CM | POA: Insufficient documentation

## 2013-01-08 DIAGNOSIS — M549 Dorsalgia, unspecified: Secondary | ICD-10-CM | POA: Insufficient documentation

## 2013-01-08 DIAGNOSIS — Z87440 Personal history of urinary (tract) infections: Secondary | ICD-10-CM | POA: Insufficient documentation

## 2013-01-08 DIAGNOSIS — F3130 Bipolar disorder, current episode depressed, mild or moderate severity, unspecified: Secondary | ICD-10-CM | POA: Insufficient documentation

## 2013-01-08 DIAGNOSIS — Z3202 Encounter for pregnancy test, result negative: Secondary | ICD-10-CM | POA: Insufficient documentation

## 2013-01-08 DIAGNOSIS — Z79899 Other long term (current) drug therapy: Secondary | ICD-10-CM | POA: Insufficient documentation

## 2013-01-08 DIAGNOSIS — F17200 Nicotine dependence, unspecified, uncomplicated: Secondary | ICD-10-CM | POA: Insufficient documentation

## 2013-01-08 DIAGNOSIS — F4310 Post-traumatic stress disorder, unspecified: Secondary | ICD-10-CM | POA: Insufficient documentation

## 2013-01-08 LAB — CBC WITH DIFFERENTIAL/PLATELET
Basophils Absolute: 0 10*3/uL (ref 0.0–0.1)
Eosinophils Relative: 2 % (ref 0–5)
Lymphocytes Relative: 29 % (ref 12–46)
Lymphs Abs: 2.5 10*3/uL (ref 0.7–4.0)
MCV: 75.8 fL — ABNORMAL LOW (ref 78.0–100.0)
Neutro Abs: 5.5 10*3/uL (ref 1.7–7.7)
Neutrophils Relative %: 63 % (ref 43–77)
Platelets: 259 10*3/uL (ref 150–400)
RBC: 4.72 MIL/uL (ref 3.87–5.11)
RDW: 17 % — ABNORMAL HIGH (ref 11.5–15.5)
WBC: 8.6 10*3/uL (ref 4.0–10.5)

## 2013-01-08 LAB — URINALYSIS, ROUTINE W REFLEX MICROSCOPIC
Glucose, UA: NEGATIVE mg/dL
Ketones, ur: NEGATIVE mg/dL
Nitrite: NEGATIVE
Protein, ur: NEGATIVE mg/dL
pH: 6 (ref 5.0–8.0)

## 2013-01-08 LAB — POCT I-STAT, CHEM 8
BUN: 11 mg/dL (ref 6–23)
Calcium, Ion: 1.17 mmol/L (ref 1.12–1.23)
Chloride: 105 mEq/L (ref 96–112)
Glucose, Bld: 112 mg/dL — ABNORMAL HIGH (ref 70–99)
TCO2: 25 mmol/L (ref 0–100)

## 2013-01-08 LAB — URINE MICROSCOPIC-ADD ON

## 2013-01-08 LAB — PREGNANCY, URINE: Preg Test, Ur: NEGATIVE

## 2013-01-08 MED ORDER — DIAZEPAM 5 MG/ML IJ SOLN
5.0000 mg | Freq: Once | INTRAMUSCULAR | Status: AC
  Administered 2013-01-08: 5 mg via INTRAVENOUS
  Filled 2013-01-08: qty 2

## 2013-01-08 MED ORDER — HYDROMORPHONE HCL PF 1 MG/ML IJ SOLN
1.0000 mg | Freq: Once | INTRAMUSCULAR | Status: AC
  Administered 2013-01-08: 1 mg via INTRAVENOUS
  Filled 2013-01-08: qty 1

## 2013-01-08 MED ORDER — PROMETHAZINE HCL 25 MG/ML IJ SOLN
12.5000 mg | Freq: Once | INTRAMUSCULAR | Status: AC
  Administered 2013-01-08: 12.5 mg via INTRAVENOUS
  Filled 2013-01-08: qty 1

## 2013-01-08 MED ORDER — HYDROMORPHONE HCL PF 1 MG/ML IJ SOLN
1.0000 mg | Freq: Once | INTRAMUSCULAR | Status: AC
Start: 2013-01-08 — End: 2013-01-08
  Administered 2013-01-08: 1 mg via INTRAVENOUS
  Filled 2013-01-08: qty 1

## 2013-01-08 NOTE — ED Notes (Signed)
Per EMS, pt c/o neck to lower back pain starting x 3 days.   Pain 10/10.  Normally takes vicodin without relief. Vita;s 180/102, hr 100, resp 18.  Pt able to ambulate to ambulance.  No trauma indicated.

## 2013-01-08 NOTE — ED Notes (Signed)
Pt was able to ambulate to the bathroom and back w/ a steady gait

## 2013-01-08 NOTE — ED Provider Notes (Signed)
CSN: [id number]     Arrival date & time 01/08/13  0257 History     First MD Initiated Contact with Patient 01/08/13 0305     Chief Complaint  Patient presents with  . Back Pain   HPI  History provided by the patient. Patient is a 27 year old female history of PTSD, bipolar disorder and chronic back pain who presents with complaints of worsened chronic back pain. Symptoms have been worsening over the last 3 days and are not relieved with her home prescription of hydrocodone 10 mg. Patient usually takes 2-3 hydrocodone throughout the day for her pain symptoms. This has not helped significantly. She has not used any additional medications or other treatments for her symptoms. Pain is described as sharp and severe radiating from the neck all the way down the entire spine. The pain also radiates slightly into the right gluteal area. Patient also complained of some tingling sensations in her right foot yesterday however this is not present today. There has been no other changes in symptoms. Denies any new urinary complaints. No urinary or fecal incontinence, urinary retention or perineal numbness. No dysuria, hematuria urinary frequency. Denies any fever, chills or sweats with symptoms today. Patient has followup with the primary care provider for her symptoms and states that they're planning to order MRIs. She does not have an exact appointment time planned yet. Pain is worse with movements and trying to walk. Denies any other aggravating or alleviating factors. No other associated symptoms.       Past Medical History  Diagnosis Date  . Depression   . Bipolar 1 disorder, depressed   . PTSD (post-traumatic stress disorder)   . Kidney stone   . Migraine   . Urinary tract infection   . Pregnancy induced hypertension    Past Surgical History  Procedure Laterality Date  . Kidney stones    . Lipotripsy    . No past surgeries     Family History  Problem Relation Age of Onset  . Rheum  arthritis Mother   . Asthma Mother   . Hypertension Mother   . Other Neg Hx    History  Substance Use Topics  . Smoking status: Current Every Day Smoker -- 0.25 packs/day  . Smokeless tobacco: Never Used  . Alcohol Use: No   OB History   Grav Para Term Preterm Abortions TAB SAB Ect Mult Living   3 2 2  1  1   2      Review of Systems  Constitutional: Negative for fever, chills and diaphoresis.  HENT: Positive for neck pain.   Gastrointestinal: Negative for abdominal pain.  Musculoskeletal: Positive for back pain.  Neurological: Negative for weakness and numbness.  All other systems reviewed and are negative.    Allergies  Darvocet; Sulfa antibiotics; and Tomato  Home Medications   Current Outpatient Rx  Name  Route  Sig  Dispense  Refill  . acetaminophen (TYLENOL) 500 MG tablet   Oral   Take 1,000 mg by mouth every 6 (six) hours as needed for pain.         . ARIPiprazole (ABILIFY) 20 MG tablet   Oral   Take 20 mg by mouth every morning.          . clonazePAM (KLONOPIN) 1 MG tablet   Oral   Take 1 mg by mouth 2 (two) times daily.         Marland Kitchen HYDROcodone-acetaminophen (NORCO) 10-325 MG per tablet   Oral  Take 1 tablet by mouth every 6 (six) hours as needed for pain.         Marland Kitchen ibuprofen (ADVIL,MOTRIN) 200 MG tablet   Oral   Take 600 mg by mouth every 6 (six) hours as needed for pain or headache.         . metoCLOPramide (REGLAN) 10 MG tablet   Oral   Take 1 tablet (10 mg total) by mouth every 6 (six) hours as needed (nausea/headache).   6 tablet   0   . oxyCODONE-acetaminophen (PERCOCET) 5-325 MG per tablet   Oral   Take 2 tablets by mouth every 4 (four) hours as needed for pain.   10 tablet   0   . venlafaxine XR (EFFEXOR-XR) 75 MG 24 hr capsule   Oral   Take 75 mg by mouth every morning.           BP 144/75  Pulse 94  Temp(Src) 97.9 F (36.6 C) (Oral)  Resp 20  SpO2 97%  LMP 12/30/2012 Physical Exam  Nursing note and vitals  reviewed. Constitutional: She is oriented to person, place, and time. She appears well-developed and well-nourished. No distress.  HENT:  Head: Normocephalic.  Cardiovascular: Normal rate and regular rhythm.   Pulmonary/Chest: Effort normal and breath sounds normal. No respiratory distress.  Abdominal: Soft.  Musculoskeletal: Normal range of motion.       Cervical back: She exhibits tenderness. She exhibits no deformity.       Thoracic back: She exhibits tenderness.       Lumbar back: She exhibits tenderness.       Back:  Patient complains of tenderness and pain with palpation along the entire spine. There is no gross deformities. Skin is normal. No swelling.  Neurological: She is alert and oriented to person, place, and time. She has normal strength. No sensory deficit.  Normal and equal strength in lower extremities. Normal sensations to light touch bilaterally.  Skin: Skin is warm and dry. No rash noted.  Psychiatric: She has a normal mood and affect. Her behavior is normal.    ED Course   Procedures   Results for orders placed during the hospital encounter of 01/08/13  CBC WITH DIFFERENTIAL      Result Value Range   WBC 8.6  4.0 - 10.5 K/uL   RBC 4.72  3.87 - 5.11 MIL/uL   Hemoglobin 11.2 (*) 12.0 - 15.0 g/dL   HCT 16.1 (*) 09.6 - 04.5 %   MCV 75.8 (*) 78.0 - 100.0 fL   MCH 23.7 (*) 26.0 - 34.0 pg   MCHC 31.3  30.0 - 36.0 g/dL   RDW 40.9 (*) 81.1 - 91.4 %   Platelets 259  150 - 400 K/uL   Neutrophils Relative % 63  43 - 77 %   Neutro Abs 5.5  1.7 - 7.7 K/uL   Lymphocytes Relative 29  12 - 46 %   Lymphs Abs 2.5  0.7 - 4.0 K/uL   Monocytes Relative 6  3 - 12 %   Monocytes Absolute 0.5  0.1 - 1.0 K/uL   Eosinophils Relative 2  0 - 5 %   Eosinophils Absolute 0.2  0.0 - 0.7 K/uL   Basophils Relative 0  0 - 1 %   Basophils Absolute 0.0  0.0 - 0.1 K/uL  URINALYSIS, ROUTINE W REFLEX MICROSCOPIC      Result Value Range   Color, Urine YELLOW  YELLOW   APPearance CLOUDY (*)  CLEAR  Specific Gravity, Urine 1.025  1.005 - 1.030   pH 6.0  5.0 - 8.0   Glucose, UA NEGATIVE  NEGATIVE mg/dL   Hgb urine dipstick NEGATIVE  NEGATIVE   Bilirubin Urine NEGATIVE  NEGATIVE   Ketones, ur NEGATIVE  NEGATIVE mg/dL   Protein, ur NEGATIVE  NEGATIVE mg/dL   Urobilinogen, UA 0.2  0.0 - 1.0 mg/dL   Nitrite NEGATIVE  NEGATIVE   Leukocytes, UA SMALL (*) NEGATIVE  PREGNANCY, URINE      Result Value Range   Preg Test, Ur NEGATIVE  NEGATIVE  URINE MICROSCOPIC-ADD ON      Result Value Range   Squamous Epithelial / LPF RARE  RARE   WBC, UA 3-6  <3 WBC/hpf   Urine-Other AMORPHOUS URATES/PHOSPHATES    POCT I-STAT, CHEM 8      Result Value Range   Sodium 141  135 - 145 mEq/L   Potassium 3.9  3.5 - 5.1 mEq/L   Chloride 105  96 - 112 mEq/L   BUN 11  6 - 23 mg/dL   Creatinine, Ser 1.61  0.50 - 1.10 mg/dL   Glucose, Bld 096 (*) 70 - 99 mg/dL   Calcium, Ion 0.45  4.09 - 1.23 mmol/L   TCO2 25  0 - 100 mmol/L   Hemoglobin 12.2  12.0 - 15.0 g/dL   HCT 81.1  91.4 - 78.2 %       1. Chronic back pain      MDM  3:30AM patient seen and evaluated. Patient laying on her left lateral side appears in mild discomfort. Pains are similar to chronic symptoms without any change. No new concerning symptoms.  Patient having some improvement of her medications. She was able to ambulate without assistance. She complains of pain still 7/10. Additional pain medications ordered.  Labs unremarkable. Patient having some improvements and has been alone to the bathroom again. At this time we'll plan for discharge home.     Angus Seller, PA-C 01/08/13 [phone]

## 2013-01-08 NOTE — ED Notes (Signed)
[id number]<HY> Expected date:<BR> Expected time:<BR> Means of arrival:<BR> Comments:<BR> EMS, back pain hx of sciatica

## 2013-01-09 NOTE — ED Provider Notes (Signed)
Medical screening examination/treatment/procedure(s) were performed by non-physician practitioner and as supervising physician I was immediately available for consultation/collaboration.  Sunnie Nielsen, MD 01/09/13 [phone]

## 2013-01-22 ENCOUNTER — Emergency Department (HOSPITAL_COMMUNITY)
Admission: EM | Admit: 2013-01-22 | Discharge: 2013-01-23 | Disposition: A | Discharge status: Home / Self Care | Payer: Medicaid Other | Attending: Emergency Medicine | Admitting: Emergency Medicine

## 2013-01-22 ENCOUNTER — Emergency Department (HOSPITAL_COMMUNITY): Payer: Medicaid Other

## 2013-01-22 ENCOUNTER — Encounter (HOSPITAL_COMMUNITY): Payer: Self-pay | Admitting: Family Medicine

## 2013-01-22 DIAGNOSIS — Z23 Encounter for immunization: Secondary | ICD-10-CM | POA: Insufficient documentation

## 2013-01-22 DIAGNOSIS — W208XXA Other cause of strike by thrown, projected or falling object, initial encounter: Secondary | ICD-10-CM | POA: Insufficient documentation

## 2013-01-22 DIAGNOSIS — F17200 Nicotine dependence, unspecified, uncomplicated: Secondary | ICD-10-CM | POA: Insufficient documentation

## 2013-01-22 DIAGNOSIS — Y92009 Unspecified place in unspecified non-institutional (private) residence as the place of occurrence of the external cause: Secondary | ICD-10-CM | POA: Insufficient documentation

## 2013-01-22 DIAGNOSIS — R209 Unspecified disturbances of skin sensation: Secondary | ICD-10-CM | POA: Insufficient documentation

## 2013-01-22 DIAGNOSIS — Z87440 Personal history of urinary (tract) infections: Secondary | ICD-10-CM | POA: Insufficient documentation

## 2013-01-22 DIAGNOSIS — S8990XA Unspecified injury of unspecified lower leg, initial encounter: Secondary | ICD-10-CM | POA: Insufficient documentation

## 2013-01-22 DIAGNOSIS — F4310 Post-traumatic stress disorder, unspecified: Secondary | ICD-10-CM | POA: Insufficient documentation

## 2013-01-22 DIAGNOSIS — R269 Unspecified abnormalities of gait and mobility: Secondary | ICD-10-CM | POA: Insufficient documentation

## 2013-01-22 DIAGNOSIS — G43909 Migraine, unspecified, not intractable, without status migrainosus: Secondary | ICD-10-CM | POA: Insufficient documentation

## 2013-01-22 DIAGNOSIS — S91109A Unspecified open wound of unspecified toe(s) without damage to nail, initial encounter: Secondary | ICD-10-CM | POA: Insufficient documentation

## 2013-01-22 DIAGNOSIS — Z79899 Other long term (current) drug therapy: Secondary | ICD-10-CM | POA: Insufficient documentation

## 2013-01-22 DIAGNOSIS — Y9389 Activity, other specified: Secondary | ICD-10-CM | POA: Insufficient documentation

## 2013-01-22 DIAGNOSIS — Z87442 Personal history of urinary calculi: Secondary | ICD-10-CM | POA: Insufficient documentation

## 2013-01-22 DIAGNOSIS — S99921A Unspecified injury of right foot, initial encounter: Secondary | ICD-10-CM

## 2013-01-22 DIAGNOSIS — F329 Major depressive disorder, single episode, unspecified: Secondary | ICD-10-CM | POA: Insufficient documentation

## 2013-01-22 DIAGNOSIS — I998 Other disorder of circulatory system: Secondary | ICD-10-CM | POA: Insufficient documentation

## 2013-01-22 DIAGNOSIS — E669 Obesity, unspecified: Secondary | ICD-10-CM | POA: Insufficient documentation

## 2013-01-22 DIAGNOSIS — F3289 Other specified depressive episodes: Secondary | ICD-10-CM | POA: Insufficient documentation

## 2013-01-22 MED ORDER — HYDROMORPHONE HCL PF 1 MG/ML IJ SOLN
1.0000 mg | Freq: Once | INTRAMUSCULAR | Status: AC
  Administered 2013-01-22: 1 mg via INTRAVENOUS
  Filled 2013-01-22: qty 1

## 2013-01-22 MED ORDER — FENTANYL CITRATE 0.05 MG/ML IJ SOLN
50.0000 ug | INTRAMUSCULAR | Status: DC | PRN
  Administered 2013-01-22: 50 ug via INTRAVENOUS
  Filled 2013-01-22: qty 2

## 2013-01-22 MED ORDER — CEPHALEXIN 500 MG PO CAPS: 500.0000 mg | ORAL_CAPSULE | Freq: Four times a day (QID) | ORAL | Status: DC

## 2013-01-22 MED ORDER — OXYCODONE HCL 5 MG PO TABS
10.0000 mg | ORAL_TABLET | Freq: Once | ORAL | Status: AC
  Administered 2013-01-22: 10 mg via ORAL
  Filled 2013-01-22: qty 2

## 2013-01-22 MED ORDER — ONDANSETRON HCL 4 MG/2ML IJ SOLN
4.0000 mg | Freq: Once | INTRAMUSCULAR | Status: AC
  Administered 2013-01-22: 4 mg via INTRAVENOUS
  Filled 2013-01-22: qty 2

## 2013-01-22 MED ORDER — OXYCODONE HCL 5 MG PO TABS: 5.0000 mg | ORAL_TABLET | ORAL | Status: DC | PRN

## 2013-01-22 MED ORDER — TETANUS-DIPHTH-ACELL PERTUSSIS 5-2.5-18.5 LF-MCG/0.5 IM SUSP
0.5000 mL | Freq: Once | INTRAMUSCULAR | Status: AC
  Administered 2013-01-22: 0.5 mL via INTRAMUSCULAR
  Filled 2013-01-22: qty 0.5

## 2013-01-22 NOTE — ED Notes (Signed)
Patient transported to X-ray 

## 2013-01-22 NOTE — ED Notes (Addendum)
Per EMS, patient was moving items around in a garage and a car motor fell onto her right foot. Bleeding to toenails of 1st and 2nd toes of right foot. Pain with weight bearing and pain to right lateral ankle. Received Fentanyl 250 mcg and Zofran 4 mg IV enroute by EMS. 20 G to Left AC.

## 2013-01-22 NOTE — ED Provider Notes (Signed)
CSN: [id number]     Arrival date & time 01/22/13  2000 History     First MD Initiated Contact with Patient 01/22/13 2008     Chief Complaint  Patient presents with  . Foot Injury    Right   (Consider location/radiation/quality/duration/timing/severity/associated sxs/prior Treatment) The history is provided by the patient and medical records. No language interpreter was used.    [name] is a 27 y.o. female  with a hx of depression, PTSD, migraine presents to the Emergency Department complaining of acute, persistent pain in the right foot after a car motor dropped on it. Patient states she was cleaning the garage approximately one hour prior to arrival and moving boxes when the motor fell and dropped onto her right foot. Patient states she has injury to the great toe and the long toe the right foot as well as bruising and swelling over the dorsum of the right foot. She also endorses pain in the ankle. She has not been ambulatory since the event.. Associated symptoms include bleeding, ecchymosis and swelling.  Fentanyl given by EMS makes it better and palpation makes it worse.  Pt denies fever, chills, headache, neck pain, chest pain, shortness of breath, abdominal pain, nausea, vomiting, diarrhea, weakness, dizziness, syncope, dysuria, hematuria, numbness, weakness.     Past Medical History  Diagnosis Date  . Depression   . Bipolar 1 disorder, depressed   . PTSD (post-traumatic stress disorder)   . Kidney stone   . Migraine   . Urinary tract infection   . Pregnancy induced hypertension    Past Surgical History  Procedure Laterality Date  . Kidney stones    . Lipotripsy     Family History  Problem Relation Age of Onset  . Rheum arthritis Mother   . Asthma Mother   . Hypertension Mother   . Other Neg Hx    History  Substance Use Topics  . Smoking status: Current Every Day Smoker -- 1.00 packs/day    Types: Cigarettes  . Smokeless tobacco: Never Used  . Alcohol Use: No    OB History   Grav Para Term Preterm Abortions TAB SAB Ect Mult Living   3 2 2  1  1   2      Review of Systems  Constitutional: Negative for fever.  HENT: Negative for neck pain and neck stiffness.   Respiratory: Negative for shortness of breath.   Cardiovascular: Negative for chest pain.  Gastrointestinal: Negative for abdominal pain.  Musculoskeletal: Positive for joint swelling, arthralgias and gait problem. Negative for back pain.  Skin: Positive for color change and wound.  Allergic/Immunologic: Negative for immunocompromised state.  Neurological: Negative for weakness and numbness.  Hematological: Negative for adenopathy. Does not bruise/bleed easily.  Psychiatric/Behavioral: The patient is not nervous/anxious.   All other systems reviewed and are negative.    Allergies  Darvocet; Morphine and related; Sulfa antibiotics; and Tomato  Home Medications   Current Outpatient Rx  Name  Route  Sig  Dispense  Refill  . ARIPiprazole (ABILIFY) 20 MG tablet   Oral   Take 20 mg by mouth every morning.          . clonazePAM (KLONOPIN) 1 MG tablet   Oral   Take 1 mg by mouth 2 (two) times daily.         Marland Kitchen HYDROcodone-acetaminophen (NORCO) 10-325 MG per tablet   Oral   Take 1 tablet by mouth every 6 (six) hours as needed for pain.         Marland Kitchen  ibuprofen (ADVIL,MOTRIN) 200 MG tablet   Oral   Take 600 mg by mouth every 6 (six) hours as needed for pain or headache.         . metoCLOPramide (REGLAN) 10 MG tablet   Oral   Take 1 tablet (10 mg total) by mouth every 6 (six) hours as needed (nausea/headache).   6 tablet   0   . oxyCODONE-acetaminophen (PERCOCET) 5-325 MG per tablet   Oral   Take 2 tablets by mouth every 4 (four) hours as needed for pain.   10 tablet   0   . venlafaxine XR (EFFEXOR-XR) 75 MG 24 hr capsule   Oral   Take 75 mg by mouth every morning.          Marland Kitchen acetaminophen (TYLENOL) 500 MG tablet   Oral   Take 1,000 mg by mouth every 6 (six)  hours as needed for pain.         . cephALEXin (KEFLEX) 500 MG capsule   Oral   Take 1 capsule (500 mg total) by mouth 4 (four) times daily.   40 capsule   0   . oxyCODONE (ROXICODONE) 5 MG immediate release tablet   Oral   Take 1 tablet (5 mg total) by mouth every 4 (four) hours as needed for pain.   21 tablet   0    BP 147/84  Pulse 98  Resp 18  SpO2 99%  LMP 01/12/2013 Physical Exam  Nursing note and vitals reviewed. Constitutional: She appears well-developed and well-nourished. No distress.  HENT:  Head: Normocephalic and atraumatic.  Eyes: Conjunctivae are normal. Pupils are equal, round, and reactive to light.  Neck: Normal range of motion.  Cardiovascular: Normal rate, regular rhythm, normal heart sounds and intact distal pulses.   No murmur heard. Capillary refill less than 3 seconds in the bilateral lower extremities  Pulmonary/Chest: Effort normal and breath sounds normal. No respiratory distress. She has no wheezes.  Abdominal: Soft. Bowel sounds are normal. She exhibits no distension. There is no tenderness.  Obese  Musculoskeletal: She exhibits tenderness. She exhibits no edema.  ROM: Full range of motion of toes, right ankle and right knee  Lymphadenopathy:    She has no cervical adenopathy.  Neurological: She is alert. Coordination normal.  Sensation intact to dull and sharp in the right lower extremity Strength 5/5 in the right knee, ankle and toes including dorsiflexion and plantar flexion  Skin: Skin is warm and dry. She is not diaphoretic. There is erythema.  No tenting of the skin Laceration of the right great toenail Ecchymosis of the dorsum of the right foot  Psychiatric: She has a normal mood and affect.    ED Course   NAIL REMOVAL Date/Time: 01/22/2013 11:08 PM Performed by: Dierdre Forth Authorized by: Dierdre Forth Consent: Verbal consent obtained. Risks and benefits: risks, benefits and alternatives were  discussed Consent given by: patient and spouse Patient understanding: patient states understanding of the procedure being performed Patient consent: the patient's understanding of the procedure matches consent given Procedure consent: procedure consent matches procedure scheduled Relevant documents: relevant documents present and verified Site marked: the operative site was marked Imaging studies: imaging studies available Required items: required blood products, implants, devices, and special equipment available Patient identity confirmed: verbally with patient and arm band Time out: Immediately prior to procedure a "time out" was called to verify the correct patient, procedure, equipment, support staff and site/side marked as required. Location: right foot Location details: right  big toe Anesthesia: digital block Local anesthetic: lidocaine 2% without epinephrine Anesthetic total: 6 ml Patient sedated: no Preparation: skin prepped with Betadine Amount removed: complete Wedge excision of skin of nail fold: no Nail bed sutured: yes Nail bed suture material: 5-0 vicryl. Number of sutures: 3 Nail matrix removed: partial Removed nail replaced and anchored: no Dressing: 4x4 Patient tolerance: Patient tolerated the procedure well with no immediate complications.   (including critical care time)  Labs Reviewed - No data to display Dg Ankle Complete Right  01/22/2013   *RADIOLOGY REPORT*  Clinical Data: Trauma and pain.  RIGHT ANKLE - COMPLETE 3+ VIEW  Comparison: Foot films same date.  Findings: Apparent diffuse soft tissue swelling which is likely due to patient body habitus. No acute fracture or dislocation.  Base of fifth metatarsal and talar dome intact.  Small Achilles spur.  IMPRESSION: No acute osseous abnormality.   Original Report Authenticated By: Jeronimo Greaves, M.D.   Dg Foot Complete Right  01/22/2013   *RADIOLOGY REPORT*  Clinical Data: Traumatic injury with right foot pain   RIGHT FOOT COMPLETE - 3+ VIEW  Comparison: None.  Findings: No acute fracture or dislocation is noted.  No gross soft tissue abnormality is seen.  IMPRESSION: No acute abnormality noted.   Original Report Authenticated By: Alcide Clever, M.D.   1. Injury of great toe, right, initial encounter   2. Right foot injury, initial encounter     MDM  [name] presents with traumatic injury to the right foot.  Patient X-Ray negative for obvious fracture or dislocation. I personally reviewed the imaging tests through PACS system.  I reviewed available ER/hospitalization records through the EMR.    Removal of the nail from the right great toe reveals a 2 cm laceration of the nailbed.  Laceration repaired without difficulty; nailbed splinted. Toenail removed in its entirety.  Tdap booster given.  Pressure irrigation performed. Laceration occurred < 8 hours prior to repair which was well tolerated. Pt has no co morbidities to effect normal wound healing. Discussed suture home care w pt and answered questions. Pain managed in ED. Pt advised to follow up with orthopedics if symptoms persist for possibility of missed fracture diagnosis. Patient given post op brace while in ED, conservative therapy recommended and discussed. Pt to f-u for wound check and suture removal in 7 days with PCP or ortho. Pt is hemodynamically stable w no complaints prior to dc.     Dahlia Client Sartaj Hoskin, PA-C 01/22/13 2344

## 2013-01-23 NOTE — ED Provider Notes (Signed)
Medical screening examination/treatment/procedure(s) were conducted as a shared visit with non-physician practitioner(s) and myself.  I personally evaluated the patient during the encounter  Nail bed repaired. Foil placed in cuticle. Tetanus updated. F/u recommended.   Junius Argyle, MD 01/23/13 1226

## 2013-02-07 ENCOUNTER — Emergency Department (HOSPITAL_COMMUNITY)
Admission: EM | Admit: 2013-02-07 | Discharge: 2013-02-07 | Disposition: A | Discharge status: Home / Self Care | Payer: Medicaid Other | Attending: Emergency Medicine | Admitting: Emergency Medicine

## 2013-02-07 ENCOUNTER — Encounter (HOSPITAL_COMMUNITY): Payer: Self-pay | Admitting: Emergency Medicine

## 2013-02-07 ENCOUNTER — Emergency Department (HOSPITAL_COMMUNITY): Payer: Medicaid Other

## 2013-02-07 DIAGNOSIS — Y9389 Activity, other specified: Secondary | ICD-10-CM | POA: Insufficient documentation

## 2013-02-07 DIAGNOSIS — S0990XA Unspecified injury of head, initial encounter: Secondary | ICD-10-CM | POA: Insufficient documentation

## 2013-02-07 DIAGNOSIS — M549 Dorsalgia, unspecified: Secondary | ICD-10-CM

## 2013-02-07 DIAGNOSIS — Z87442 Personal history of urinary calculi: Secondary | ICD-10-CM | POA: Insufficient documentation

## 2013-02-07 DIAGNOSIS — Z87440 Personal history of urinary (tract) infections: Secondary | ICD-10-CM | POA: Insufficient documentation

## 2013-02-07 DIAGNOSIS — W19XXXA Unspecified fall, initial encounter: Secondary | ICD-10-CM

## 2013-02-07 DIAGNOSIS — Y929 Unspecified place or not applicable: Secondary | ICD-10-CM | POA: Insufficient documentation

## 2013-02-07 DIAGNOSIS — G43909 Migraine, unspecified, not intractable, without status migrainosus: Secondary | ICD-10-CM | POA: Insufficient documentation

## 2013-02-07 DIAGNOSIS — Z792 Long term (current) use of antibiotics: Secondary | ICD-10-CM | POA: Insufficient documentation

## 2013-02-07 DIAGNOSIS — F17200 Nicotine dependence, unspecified, uncomplicated: Secondary | ICD-10-CM | POA: Insufficient documentation

## 2013-02-07 DIAGNOSIS — F4310 Post-traumatic stress disorder, unspecified: Secondary | ICD-10-CM | POA: Insufficient documentation

## 2013-02-07 DIAGNOSIS — F3130 Bipolar disorder, current episode depressed, mild or moderate severity, unspecified: Secondary | ICD-10-CM | POA: Insufficient documentation

## 2013-02-07 DIAGNOSIS — G8929 Other chronic pain: Secondary | ICD-10-CM | POA: Insufficient documentation

## 2013-02-07 DIAGNOSIS — IMO0002 Reserved for concepts with insufficient information to code with codable children: Secondary | ICD-10-CM | POA: Insufficient documentation

## 2013-02-07 DIAGNOSIS — R296 Repeated falls: Secondary | ICD-10-CM | POA: Insufficient documentation

## 2013-02-07 DIAGNOSIS — Z79899 Other long term (current) drug therapy: Secondary | ICD-10-CM | POA: Insufficient documentation

## 2013-02-07 MED ORDER — NAPROXEN 500 MG PO TABS
500.0000 mg | ORAL_TABLET | Freq: Once | ORAL | Status: AC
  Administered 2013-02-07: 500 mg via ORAL
  Filled 2013-02-07: qty 1

## 2013-02-07 NOTE — ED Provider Notes (Signed)
This chart was scribed for [name], a non-physician practitioner working with Laura Shi, MD by Lewanda Rife, ED Scribe. This patient was seen in room WTR7/WTR7 and the patient's care was started at 1817.    CSN: [id number]     Arrival date & time 02/07/13  1755 History   First MD Initiated Contact with Patient 02/07/13 1806     Chief Complaint  Patient presents with  . Back Pain  . Fall   (Consider location/radiation/quality/duration/timing/severity/associated sxs/prior Treatment) The history is provided by the patient.   HPI Comments: [name] is a 27 y.o. female who presents to the Emergency Department complaining of constant moderate chronic back pain exacerbation onset acute following a mechanical fall in old wooden floor. Reports associated tail bone pain, and headache. Reports symptoms are aggravated by nothing and alleviated nothing. Denies associate neck pain, numbness, weakness, and change in gait. Reports taking prescribed norco, and ibuprofen with no relief of symptoms. Reports hx of chronic back pain.  Past Medical History  Diagnosis Date  . Depression   . Bipolar 1 disorder, depressed   . PTSD (post-traumatic stress disorder)   . Kidney stone   . Migraine   . Urinary tract infection   . Pregnancy induced hypertension    Past Surgical History  Procedure Laterality Date  . Kidney stones    . Lipotripsy     Family History  Problem Relation Age of Onset  . Rheum arthritis Mother   . Asthma Mother   . Hypertension Mother   . Other Neg Hx    History  Substance Use Topics  . Smoking status: Current Every Day Smoker -- 1.00 packs/day    Types: Cigarettes  . Smokeless tobacco: Never Used  . Alcohol Use: No   OB History   Grav Para Term Preterm Abortions TAB SAB Ect Mult Living   3 2 2  1  1   2      Review of Systems  Musculoskeletal: Positive for back pain.   A complete 10 system review of systems was obtained and all systems are  negative except as noted in the HPI and PMH.    Allergies  Darvocet; Morphine and related; Sulfa antibiotics; and Tomato  Home Medications   Current Outpatient Rx  Name  Route  Sig  Dispense  Refill  . acetaminophen (TYLENOL) 500 MG tablet   Oral   Take 1,000 mg by mouth every 6 (six) hours as needed for pain.         . ARIPiprazole (ABILIFY) 20 MG tablet   Oral   Take 20 mg by mouth every morning.          . cephALEXin (KEFLEX) 500 MG capsule   Oral   Take 1 capsule (500 mg total) by mouth 4 (four) times daily.   40 capsule   0   . clonazePAM (KLONOPIN) 1 MG tablet   Oral   Take 1 mg by mouth 2 (two) times daily.         Marland Kitchen HYDROcodone-acetaminophen (NORCO) 10-325 MG per tablet   Oral   Take 1 tablet by mouth every 6 (six) hours as needed for pain.         Marland Kitchen ibuprofen (ADVIL,MOTRIN) 200 MG tablet   Oral   Take 600 mg by mouth every 6 (six) hours as needed for pain or headache.         . metoCLOPramide (REGLAN) 10 MG tablet   Oral  Take 1 tablet (10 mg total) by mouth every 6 (six) hours as needed (nausea/headache).   6 tablet   0   . oxyCODONE (ROXICODONE) 5 MG immediate release tablet   Oral   Take 1 tablet (5 mg total) by mouth every 4 (four) hours as needed for pain.   21 tablet   0   . oxyCODONE-acetaminophen (PERCOCET) 5-325 MG per tablet   Oral   Take 2 tablets by mouth every 4 (four) hours as needed for pain.   10 tablet   0   . venlafaxine XR (EFFEXOR-XR) 75 MG 24 hr capsule   Oral   Take 75 mg by mouth every morning.           LMP 01/12/2013 Physical Exam  Nursing note and vitals reviewed. Constitutional: She is oriented to person, place, and time. She appears well-developed and well-nourished. No distress.  Morbidly obese  HENT:  Head: Normocephalic and atraumatic.  Eyes: EOM are normal.  Neck: Neck supple. No tracheal deviation present.  Cardiovascular: Normal rate and regular rhythm.   Pulmonary/Chest: Effort normal and  breath sounds normal. No respiratory distress.  Abdominal: Soft.  Musculoskeletal: Normal range of motion. She exhibits tenderness.       Cervical back: Normal. She exhibits no tenderness and no bony tenderness.       Thoracic back: Normal. She exhibits no tenderness and no bony tenderness.       Lumbar back: She exhibits bony tenderness.  TTP right lower leg, but no bruising, wound or swelling. TTP right para-thoracic muscles. TTP Lumbosacral spine   Neurological: She is alert and oriented to person, place, and time.  Skin: Skin is warm and dry.  Psychiatric: She has a normal mood and affect. Her behavior is normal.    ED Course  Procedures (including critical care time) Medications - No data to display  Labs Review Labs Reviewed - No data to display Imaging Review Dg Lumbar Spine Complete  02/07/2013   CLINICAL DATA:  Fall, back pain.  EXAM: LUMBAR SPINE - COMPLETE 4+ VIEW  COMPARISON:  10/26/2012  FINDINGS: There is no evidence of lumbar spine fracture. Alignment is normal. Intervertebral disc spaces are maintained.  IMPRESSION: Negative.   Electronically Signed   By: Charlett Nose   On: 02/07/2013 19:13   Dg Sacrum/coccyx  02/07/2013   CLINICAL DATA:  Fall, low back pain.  EXAM: SACRUM AND COCCYX - 2+ VIEW  COMPARISON:  Lumbar spine series 10/26/2012.  FINDINGS: There is no evidence of fracture or other focal bone lesions  IMPRESSION: Negative.   Electronically Signed   By: Charlett Nose   On: 02/07/2013 19:06    MDM   1. Back pain   2. Fall, initial encounter    Patient with back pain and coccyx pain after fall. Physical exam limited by patient's body habitus. X-rays obtained without any acute abnormality. No overlying bruising, ecchymosis or wounds. She is prescribe Norco by her primary care physician, I advised her to take this as needed for severe pain. Conservative measures discussed. Patient states understanding of plan and is agreeable.    I personally performed the  services described in this documentation, which was scribed in my presence. The recorded information has been reviewed and is accurate.    Trevor Mace, PA-C 02/07/13 1918

## 2013-02-10 NOTE — ED Provider Notes (Signed)
Medical screening examination/treatment/procedure(s) were performed by non-physician practitioner and as supervising physician I was immediately available for consultation/collaboration.    Malisha Mabey L Safiyyah Vasconez, MD 02/10/13 0719 

## 2013-06-01 ENCOUNTER — Other Ambulatory Visit: Payer: Self-pay | Admitting: Physical Medicine and Rehabilitation

## 2013-06-01 ENCOUNTER — Other Ambulatory Visit (HOSPITAL_COMMUNITY): Payer: Self-pay | Admitting: Physical Medicine and Rehabilitation

## 2013-06-01 DIAGNOSIS — M542 Cervicalgia: Secondary | ICD-10-CM

## 2013-06-01 DIAGNOSIS — M545 Low back pain: Secondary | ICD-10-CM

## 2013-06-10 ENCOUNTER — Ambulatory Visit
Admission: RE | Admit: 2013-06-10 | Discharge: 2013-06-10 | Disposition: A | Discharge status: Home / Self Care | Payer: Medicaid Other | Source: Ambulatory Visit | Attending: Physical Medicine and Rehabilitation | Admitting: Physical Medicine and Rehabilitation

## 2013-06-10 DIAGNOSIS — M545 Low back pain: Secondary | ICD-10-CM

## 2013-08-18 ENCOUNTER — Encounter (HOSPITAL_COMMUNITY): Payer: Self-pay | Admitting: Emergency Medicine

## 2013-08-18 ENCOUNTER — Emergency Department (HOSPITAL_COMMUNITY)
Admission: EM | Admit: 2013-08-18 | Discharge: 2013-08-18 | Disposition: A | Discharge status: Home / Self Care | Payer: Medicaid Other | Attending: Emergency Medicine | Admitting: Emergency Medicine

## 2013-08-18 ENCOUNTER — Emergency Department (HOSPITAL_COMMUNITY): Payer: Medicaid Other

## 2013-08-18 DIAGNOSIS — Z7982 Long term (current) use of aspirin: Secondary | ICD-10-CM | POA: Insufficient documentation

## 2013-08-18 DIAGNOSIS — F319 Bipolar disorder, unspecified: Secondary | ICD-10-CM | POA: Insufficient documentation

## 2013-08-18 DIAGNOSIS — R109 Unspecified abdominal pain: Secondary | ICD-10-CM

## 2013-08-18 DIAGNOSIS — A599 Trichomoniasis, unspecified: Secondary | ICD-10-CM | POA: Insufficient documentation

## 2013-08-18 DIAGNOSIS — R5383 Other fatigue: Secondary | ICD-10-CM

## 2013-08-18 DIAGNOSIS — G43909 Migraine, unspecified, not intractable, without status migrainosus: Secondary | ICD-10-CM | POA: Insufficient documentation

## 2013-08-18 DIAGNOSIS — R509 Fever, unspecified: Secondary | ICD-10-CM | POA: Insufficient documentation

## 2013-08-18 DIAGNOSIS — R5381 Other malaise: Secondary | ICD-10-CM | POA: Insufficient documentation

## 2013-08-18 DIAGNOSIS — F4310 Post-traumatic stress disorder, unspecified: Secondary | ICD-10-CM | POA: Insufficient documentation

## 2013-08-18 DIAGNOSIS — Z87442 Personal history of urinary calculi: Secondary | ICD-10-CM | POA: Insufficient documentation

## 2013-08-18 DIAGNOSIS — Z79899 Other long term (current) drug therapy: Secondary | ICD-10-CM | POA: Insufficient documentation

## 2013-08-18 DIAGNOSIS — R112 Nausea with vomiting, unspecified: Secondary | ICD-10-CM | POA: Insufficient documentation

## 2013-08-18 DIAGNOSIS — R630 Anorexia: Secondary | ICD-10-CM | POA: Insufficient documentation

## 2013-08-18 DIAGNOSIS — N390 Urinary tract infection, site not specified: Secondary | ICD-10-CM | POA: Insufficient documentation

## 2013-08-18 DIAGNOSIS — Z3202 Encounter for pregnancy test, result negative: Secondary | ICD-10-CM | POA: Insufficient documentation

## 2013-08-18 DIAGNOSIS — F17200 Nicotine dependence, unspecified, uncomplicated: Secondary | ICD-10-CM | POA: Insufficient documentation

## 2013-08-18 DIAGNOSIS — N8000 Endometriosis of the uterus, unspecified: Secondary | ICD-10-CM | POA: Insufficient documentation

## 2013-08-18 LAB — CBC WITH DIFFERENTIAL/PLATELET
BASOS ABS: 0 10*3/uL (ref 0.0–0.1)
BASOS PCT: 0 % (ref 0–1)
EOS ABS: 0.2 10*3/uL (ref 0.0–0.7)
EOS PCT: 2 % (ref 0–5)
HCT: 41.3 % (ref 36.0–46.0)
Hemoglobin: 13.4 g/dL (ref 12.0–15.0)
LYMPHS ABS: 2.3 10*3/uL (ref 0.7–4.0)
Lymphocytes Relative: 28 % (ref 12–46)
MCH: 25.3 pg — AB (ref 26.0–34.0)
MCHC: 32.4 g/dL (ref 30.0–36.0)
MCV: 77.9 fL — AB (ref 78.0–100.0)
Monocytes Absolute: 0.5 10*3/uL (ref 0.1–1.0)
Monocytes Relative: 6 % (ref 3–12)
NEUTROS PCT: 63 % (ref 43–77)
Neutro Abs: 5.2 10*3/uL (ref 1.7–7.7)
PLATELETS: 209 10*3/uL (ref 150–400)
RBC: 5.3 MIL/uL — ABNORMAL HIGH (ref 3.87–5.11)
RDW: 15.9 % — AB (ref 11.5–15.5)
WBC: 8.2 10*3/uL (ref 4.0–10.5)

## 2013-08-18 LAB — BASIC METABOLIC PANEL
BUN: 9 mg/dL (ref 6–23)
CALCIUM: 8.9 mg/dL (ref 8.4–10.5)
CO2: 28 mEq/L (ref 19–32)
Chloride: 99 mEq/L (ref 96–112)
Creatinine, Ser: 0.64 mg/dL (ref 0.50–1.10)
GLUCOSE: 221 mg/dL — AB (ref 70–99)
POTASSIUM: 4.1 meq/L (ref 3.7–5.3)
SODIUM: 138 meq/L (ref 137–147)

## 2013-08-18 LAB — URINE MICROSCOPIC-ADD ON

## 2013-08-18 LAB — URINALYSIS, ROUTINE W REFLEX MICROSCOPIC
BILIRUBIN URINE: NEGATIVE
Glucose, UA: NEGATIVE mg/dL
KETONES UR: NEGATIVE mg/dL
NITRITE: NEGATIVE
PH: 5.5 (ref 5.0–8.0)
Protein, ur: NEGATIVE mg/dL
Specific Gravity, Urine: 1.01 (ref 1.005–1.030)
UROBILINOGEN UA: 1 mg/dL (ref 0.0–1.0)

## 2013-08-18 LAB — WET PREP, GENITAL: YEAST WET PREP: NONE SEEN

## 2013-08-18 LAB — POC URINE PREG, ED: Preg Test, Ur: NEGATIVE

## 2013-08-18 MED ORDER — CEPHALEXIN 500 MG PO CAPS: 500.0000 mg | ORAL_CAPSULE | Freq: Four times a day (QID) | ORAL | Status: DC

## 2013-08-18 MED ORDER — MORPHINE SULFATE 4 MG/ML IJ SOLN
4.0000 mg | INTRAMUSCULAR | Status: DC | PRN
  Administered 2013-08-18: 4 mg via INTRAVENOUS
  Filled 2013-08-18: qty 1

## 2013-08-18 MED ORDER — DOXYCYCLINE HYCLATE 50 MG PO CAPS: 100.0000 mg | ORAL_CAPSULE | Freq: Two times a day (BID) | ORAL | Status: AC

## 2013-08-18 MED ORDER — METRONIDAZOLE 500 MG PO TABS
2000.0000 mg | ORAL_TABLET | Freq: Once | ORAL | Status: AC
  Administered 2013-08-18: 2000 mg via ORAL
  Filled 2013-08-18: qty 4

## 2013-08-18 MED ORDER — DIPHENHYDRAMINE HCL 25 MG PO CAPS
50.0000 mg | ORAL_CAPSULE | Freq: Once | ORAL | Status: AC
  Administered 2013-08-18: 50 mg via ORAL
  Filled 2013-08-18: qty 2

## 2013-08-18 MED ORDER — DEXTROSE 5 % IV SOLN
1.0000 g | Freq: Once | INTRAVENOUS | Status: AC
  Administered 2013-08-18: 1 g via INTRAVENOUS
  Filled 2013-08-18: qty 10

## 2013-08-18 MED ORDER — MORPHINE SULFATE 4 MG/ML IJ SOLN
4.0000 mg | INTRAMUSCULAR | Status: DC | PRN
Start: 2013-08-18 — End: 2013-08-18
  Administered 2013-08-18 (×2): 4 mg via INTRAVENOUS
  Filled 2013-08-18 (×2): qty 1

## 2013-08-18 MED ORDER — OXYCODONE-ACETAMINOPHEN 5-325 MG PO TABS: 1.0000 | ORAL_TABLET | Freq: Four times a day (QID) | ORAL | Status: DC | PRN

## 2013-08-18 MED ORDER — ONDANSETRON HCL 4 MG/2ML IJ SOLN
4.0000 mg | Freq: Once | INTRAMUSCULAR | Status: AC
  Administered 2013-08-18: 4 mg via INTRAVENOUS
  Filled 2013-08-18: qty 2

## 2013-08-18 MED ORDER — ONDANSETRON HCL 4 MG PO TABS: 4.0000 mg | ORAL_TABLET | Freq: Three times a day (TID) | ORAL | Status: DC | PRN

## 2013-08-18 NOTE — ED Notes (Signed)
Pt to CT

## 2013-08-18 NOTE — ED Notes (Signed)
Pt c/o chronic back pain from bulging disc that her norco home meds arent helping.  PT also c/o right flank pain and hematuria x 4 days.  Pt states her cycle isnt scheduled to start until 3/21.  Pt also been vomiting today and fever for 4 days.

## 2013-08-18 NOTE — Discharge Instructions (Signed)
Trichomoniasis °Trichomoniasis is an infection, caused by the Trichomonas organism, that affects both women and men. In women, the outer female genitalia and the vagina are affected. In men, the penis is mainly affected, but the prostate and other reproductive organs can also be involved. Trichomoniasis is a sexually transmitted disease (STD) and is most often passed to another person through sexual contact. The majority of people who get trichomoniasis do so from a sexual encounter and are also at risk for other STDs. °CAUSES  °· Sexual intercourse with an infected partner. °· It can be present in swimming pools or hot tubs. °SYMPTOMS  °· Abnormal gray-green frothy vaginal discharge in women. °· Vaginal itching and irritation in women. °· Itching and irritation of the area outside the vagina in women. °· Penile discharge with or without pain in males. °· Inflammation of the urethra (urethritis), causing painful urination. °· Bleeding after sexual intercourse. °RELATED COMPLICATIONS °· Pelvic inflammatory disease. °· Infection of the uterus (endometritis). °· Infertility. °· Tubal (ectopic) pregnancy. °· It can be associated with other STDs, including gonorrhea and chlamydia, hepatitis B, and HIV. °COMPLICATIONS DURING PREGNANCY °· Early (premature) delivery. °· Premature rupture of the membranes (PROM). °· Low birth weight. °DIAGNOSIS  °· Visualization of Trichomonas under the microscope from the vagina discharge. °· Ph of the vagina greater than 4.5, tested with a test tape. °· Trich Rapid Test. °· Culture of the organism, but this is not usually needed. °· It may be found on a Pap test. °· Having a "strawberry cervix,"which means the cervix looks very red like a strawberry. °TREATMENT  °· You may be given medication to fight the infection. Inform your caregiver if you could be or are pregnant. Some medications used to treat the infection should not be taken during pregnancy. °· Over-the-counter medications or  creams to decrease itching or irritation may be recommended. °· Your sexual partner will need to be treated if infected. °HOME CARE INSTRUCTIONS  °· Take all medication prescribed by your caregiver. °· Take over-the-counter medication for itching or irritation as directed by your caregiver. °· Do not have sexual intercourse while you have the infection. °· Do not douche or wear tampons. °· Discuss your infection with your partner, as your partner may have acquired the infection from you. Or, your partner may have been the person who transmitted the infection to you. °· Have your sex partner examined and treated if necessary. °· Practice safe, informed, and protected sex. °· See your caregiver for other STD testing. °SEEK MEDICAL CARE IF:  °· You still have symptoms after you finish the medication. °· You have an oral temperature above 102° F (38.9° C). °· You develop belly (abdominal) pain. °· You have pain when you urinate. °· You have bleeding after sexual intercourse. °· You develop a rash. °· The medication makes you sick or makes you throw up (vomit). °Document Released: 11/21/2000 Document Revised: 08/20/2011 Document Reviewed: 12/17/2008 °[employer]® Patient Information ©2014 [employer]. °Safe Sex °Safe sex is about reducing the risk of giving or getting a sexually transmitted disease (STD). STDs are spread through sexual contact involving the genitals, mouth, or rectum. Some STDS can be cured and others cannot. Safe sex can also prevent unintended pregnancies.  °SAFE SEX PRACTICES °· Limit your sexual activity to only one partner who is only having sex with you. °· Talk to your partner about their past partners, past STDs, and drug use. °· Use a condom every time you have sexual intercourse. This includes   vaginal, oral, and anal sexual activity. Both females and males should wear condoms during oral sex. Only use latex or polyurethane condoms and water-based lubricants. Petroleum-based lubricants or oils  used to lubricate a condom will weaken the condom and increase the chance that it will break. The condom should be in place from the beginning to the end of sexual activity. Wearing a condom reduces, but does not completely eliminate, your risk of getting or giving a STD. STDs can be spread by contact with skin of surrounding areas.  Get vaccinated for hepatitis B and HPV.  Avoid alcohol and recreational drugs which can affect your judgement. You may forget to use a condom or participate in high-risk sex.  For females, avoid douching after sexual intercourse. Douching can spread an infection farther into the reproductive tract.  Check your body for signs of sores, blisters, rashes, or unusual discharge. See your caregiver if you notice any of these signs.  Avoid sexual contact if you have symptoms of an infection or are being treated for an STD. If you or your partner has herpes, avoid sexual contact when blisters are present. Use condoms at all other times.  See your caregiver for regular screenings, examinations, and tests for STDs. Before having sex with a new partner, each of you should be screened for STDs and talk about the results with your partner. BENEFITS OF SAFE SEX   There is less of a chance of getting or giving an STD.  You can prevent unwanted or unintended pregnancies.  By discussing safer sex concerns with your partner, you may increase feelings of intimacy, comfort, trust, and honesty between the both of you. Document Released: 07/05/2004 Document Revised: 02/20/2012 Document Reviewed: 11/19/2011 [facility] Patient Information 2014 [place], [place].

## 2013-08-18 NOTE — ED Provider Notes (Signed)
CSN: [id number]     Arrival date & time 08/18/13  1448 History   First MD Initiated Contact with Patient 08/18/13 1508     Chief Complaint  Patient presents with  . Back Pain  . Flank Pain  . Hematuria   HPI  ZORIANNA TALIAFERRO is a 28 y.o. female presented to the ED with back/flank pain with hematuria for 4 days. She has a history of kidneys stones in the past requiring lithotripsy. She has had a history of bilateral kidney stones dating back to 2007 and one time required stents. She has been evaluated by Dr. Warner Mccreedy in the past, and has been trying to get a hold of his office, but they stated since it has been so long since she has been there she would need another referral from her PCP. Patient describes the pain as a 10/10 sharp stabbing pain ion her right back, that radiates up over her right flank. She has attempted to take ibuprofen and BC powder for her pain, but it has not helped. She endorsed fever since Friday with Tmax of 102.57F. She is making appropriate urine. Patient reports she has bene nasuated and not eating well. She has tried hot showers and heating pads, as well. She denies diarrhea, endorses vomit x3 today and nausea. LMP 07/31/2013. She is monogamous with on partner, last sexual encounter last week. Patient has a prior history of STD.   Past Medical History  Diagnosis Date  . Depression   . Bipolar 1 disorder, depressed   . PTSD (post-traumatic stress disorder)   . Kidney stone   . Migraine   . Urinary tract infection   . Pregnancy induced hypertension    Past Surgical History  Procedure Laterality Date  . Kidney stones    . Lipotripsy     Family History  Problem Relation Age of Onset  . Rheum arthritis Mother   . Asthma Mother   . Hypertension Mother   . Other Neg Hx    History  Substance Use Topics  . Smoking status: Current Every Day Smoker -- 1.00 packs/day    Types: Cigarettes  . Smokeless tobacco: Never Used  . Alcohol Use: No   OB History   Grav Para  Term Preterm Abortions TAB SAB Ect Mult Living   3 2 2  1  1   2      Review of Systems  Constitutional: Positive for fever, chills, activity change, appetite change and fatigue. Negative for unexpected weight change.  HENT: Negative for congestion, ear discharge, ear pain, rhinorrhea, sneezing and sore throat.   Eyes: Negative for pain and itching.  Respiratory: Negative for cough and chest tightness.   Cardiovascular: Negative for chest pain and palpitations.  Gastrointestinal: Positive for nausea, vomiting and abdominal pain. Negative for diarrhea and constipation.  Genitourinary: Positive for frequency, hematuria and flank pain. Negative for dysuria, urgency, decreased urine volume, genital sores and pelvic pain.   Allergies  Darvocet; Morphine and related; Sulfa antibiotics; and Tomato  Home Medications   Current Outpatient Rx  Name  Route  Sig  Dispense  Refill  . Aspirin-Salicylamide-Caffeine (BC HEADACHE POWDER PO)   Oral   Take 1 packet by mouth once.         . clonazePAM (KLONOPIN) 1 MG tablet   Oral   Take 1 mg by mouth 2 (two) times daily.         Marland Kitchen HYDROcodone-acetaminophen (NORCO) 10-325 MG per tablet   Oral   Take  1 tablet by mouth every 6 (six) hours as needed for pain.         Marland Kitchen ibuprofen (ADVIL,MOTRIN) 200 MG tablet   Oral   Take 600 mg by mouth every 6 (six) hours as needed for pain or headache.         . Lurasidone HCl (LATUDA) 20 MG TABS   Oral   Take 1 tablet by mouth daily.         . pregabalin (LYRICA) 75 MG capsule   Oral   Take 75 mg by mouth 2 (two) times daily.         . Prenatal Vit-Fe Fumarate-FA (PRENATAL MULTIVITAMIN) TABS tablet   Oral   Take 1 tablet by mouth daily.          BP 130/71  Pulse 94  Temp(Src) 98.2 F (36.8 C) (Oral)  Resp 14  Ht 5\' 7"  (1.702 m)  SpO2 99%  LMP 08/01/2013 Physical Exam Gen: patient in tears and obvious pain .  HEENT: AT. [place].  Bilateral eyes without injections or icterus. MMM.   CV:  RRR. Distant heart sounds d/t habitus.  Chest: CTAB, no wheeze or crackles Abd: Morbid obese. Soft. ND. No tenderness.  BS present. No  Masses. Ext: No erythema. No edema.  Msk: CVA tenderness right.  Skin: No rashes, purpura or petechiae.  Neuro: Normal gait. PERLA. EOMi. Alert. Grossly intact.  Psych: normal dress and affect.  GYN:  External genitalia within normal limits.  Vaginal mucosa pink, moist, normal rugae.  Mildly friable cervix without lesions. Moderate bloody brown thin discharge. Odor present.  Bimanual exam difficult to interpret d/t habitus. Cervical motion tenderness present as well bilateral adnexal tenderness R>L. No adnexal masses bilaterally.    ED Course  Procedures (including critical care time) Labs Review Labs Reviewed  WET PREP, GENITAL - Abnormal; Notable for the following:    Trich, Wet Prep FEW (*)    Clue Cells Wet Prep HPF POC FEW (*)    WBC, Wet Prep HPF POC FEW (*)    All other components within normal limits  URINALYSIS, ROUTINE W REFLEX MICROSCOPIC - Abnormal; Notable for the following:    APPearance CLOUDY (*)    Hgb urine dipstick LARGE (*)    Leukocytes, UA MODERATE (*)    All other components within normal limits  CBC WITH DIFFERENTIAL - Abnormal; Notable for the following:    RBC 5.30 (*)    MCV 77.9 (*)    MCH 25.3 (*)    RDW 15.9 (*)    All other components within normal limits  BASIC METABOLIC PANEL - Abnormal; Notable for the following:    Glucose, Bld 221 (*)    All other components within normal limits  URINE MICROSCOPIC-ADD ON - Abnormal; Notable for the following:    Squamous Epithelial / LPF MANY (*)    Bacteria, UA MANY (*)    All other components within normal limits  GC/CHLAMYDIA PROBE AMP  URINE CULTURE  POC URINE PREG, ED   Imaging Review Ct Abdomen Pelvis Wo Contrast  08/18/2013   CLINICAL DATA Right flank pain  EXAM CT ABDOMEN AND PELVIS WITHOUT CONTRAST  TECHNIQUE Multidetector CT imaging of the abdomen and pelvis  was performed following the standard protocol without intravenous contrast.  COMPARISON None.  FINDINGS There is diffuse fatty infiltration of liver. The spleen, pancreas, gallbladder, adrenal glands and kidneys are normal. There is no nephrolithiasis or hydroureteronephrosis bilaterally. The aorta is normal. There is no  abdominal lymphadenopathy. There is no small bowel obstruction or diverticulitis. The appendix is not seen but no inflammation is noted around the cecum.  Fluid-filled bladder is normal. Fluid is identified within the endometrial cavity normal for age. There is no pelvic lymphadenopathy. The lung bases are clear. Mild degenerative joint changes are identified in the lower thoracic spine.  IMPRESSION No nephrolithiasis or hydroureteronephrosis bilaterally. No acute abnormality identified in the abdomen and pelvis.  SIGNATURE  Electronically Signed   By: Sherian Rein M.D.   On: 08/18/2013 16:31   US Transvaginal Non-ob  08/18/2013   CLINICAL DATA Pelvic pain ; LMP 07/31/2013  EXAM TRANSABDOMINAL AND TRANSVAGINAL ULTRASOUND OF PELVIS  TECHNIQUE Both transabdominal and transvaginal ultrasound examinations of the pelvis were performed. Transabdominal technique was performed for global imaging of the pelvis including uterus, ovaries, adnexal regions, and pelvic cul-de-sac. It was necessary to proceed with endovaginal exam following the transabdominal exam to visualize the uterus, endometrium, ovaries and adnexae. Transabdominal imaging is severely limited by inadequate bladder distention and poor acoustic window.  COMPARISON CT abdomen and pelvis 08/18/2013  FINDINGS Uterus  Measurements: 8.6 x 5.2 x 6.1 cm. Normal morphology without mass.  Endometrium  Thickness: 17 mm thick. Question minimal endometrial fluid at upper uterine segment. No other focal abnormalities.  Right ovary  Measurements: 2.8 x 1.3 x 1.9 cm. Normal morphology without mass. Internal blood flow present on color Doppler imaging.  Small partially collapsed follicle cysts noted.  Left ovary  Measurements: 4.2 x 2.1 x 2.4 cm. Normal morphology with a dominant follicle cyst 3.4 x 1.9 x 1.6 cm  Other findings  No free pelvic fluid or additional adnexal masses.  IMPRESSION Small left ovarian follicle cyst 3.4 cm greatest size.  Upper normal thickness of the endometrial complex at 17 mm thick questionably containing a tiny amount of fluid.  Otherwise negative exam.  SIGNATURE  Electronically Signed   By: Ulyses Southward M.D.   On: 08/18/2013 18:20   US Pelvis Complete  08/18/2013   CLINICAL DATA Pelvic pain ; LMP 07/31/2013  EXAM TRANSABDOMINAL AND TRANSVAGINAL ULTRASOUND OF PELVIS  TECHNIQUE Both transabdominal and transvaginal ultrasound examinations of the pelvis were performed. Transabdominal technique was performed for global imaging of the pelvis including uterus, ovaries, adnexal regions, and pelvic cul-de-sac. It was necessary to proceed with endovaginal exam following the transabdominal exam to visualize the uterus, endometrium, ovaries and adnexae. Transabdominal imaging is severely limited by inadequate bladder distention and poor acoustic window.  COMPARISON CT abdomen and pelvis 08/18/2013  FINDINGS Uterus  Measurements: 8.6 x 5.2 x 6.1 cm. Normal morphology without mass.  Endometrium  Thickness: 17 mm thick. Question minimal endometrial fluid at upper uterine segment. No other focal abnormalities.  Right ovary  Measurements: 2.8 x 1.3 x 1.9 cm. Normal morphology without mass. Internal blood flow present on color Doppler imaging. Small partially collapsed follicle cysts noted.  Left ovary  Measurements: 4.2 x 2.1 x 2.4 cm. Normal morphology with a dominant follicle cyst 3.4 x 1.9 x 1.6 cm  Other findings  No free pelvic fluid or additional adnexal masses.  IMPRESSION Small left ovarian follicle cyst 3.4 cm greatest size.  Upper normal thickness of the endometrial complex at 17 mm thick questionably containing a tiny amount of fluid.   Otherwise negative exam.  SIGNATURE  Electronically Signed   By: Ulyses Southward M.D.   On: 08/18/2013 18:20     EKG Interpretation None      MDM   Final  diagnoses:  Trichomonas infection  Flank pain  UTI (lower urinary tract infection)   Patient with right sided back/flank pain with history of kidney stones and positive STD screening. Ct abdomen pelvis was without nephrolithiasis today. On pelvic exam patient patient did have bloody discharge, cervical motion tenderness and positive trichomonas. Patient was given an US to r/o PID, and it was negative with the exception of mild endometrial thickening that can be work up as outpatient. Urine pregnancy was negative. Patients UA was with large blood, that likely could have been from her bloody discharge or passage of stone. Urine was sent for culture. With CVA tenderness and report of being febrile will treat patient as possible pyelonephritis and possible PID d/t her exam tenderness. Patient was instructed to follow up with PCP or GYN prior to weekend to follow up on cultures (urine and G/C). Patient incidentally with >200 blood glucose and likely should be screened for diabetes. Patient prescribed keflex for UTI and doxycyline for PID. She was treated with flagyl for trichomonas. Percocet #20.     Natalia Leatherwoodenee A Kuneff, DO 08/18/13 1909

## 2013-08-18 NOTE — ED Notes (Signed)
Pt to US.

## 2013-08-18 NOTE — ED Notes (Signed)
Initial Contact - pt c/o 10/10 pain to R flank/low back.  Pt reports hx back pain and bulging disks.  Pt denies dysuria, fevers/chills, or other complaints.  Skin PWD.  MAEI, ambulatory with steady gait.  NAD.

## 2013-08-19 LAB — GC/CHLAMYDIA PROBE AMP
CT PROBE, AMP APTIMA: NEGATIVE
GC PROBE AMP APTIMA: NEGATIVE

## 2013-08-19 LAB — URINE CULTURE
COLONY COUNT: NO GROWTH
Culture: NO GROWTH

## 2013-08-21 ENCOUNTER — Encounter (HOSPITAL_COMMUNITY): Payer: Self-pay | Admitting: Emergency Medicine

## 2013-08-21 ENCOUNTER — Emergency Department (HOSPITAL_COMMUNITY)
Admission: EM | Admit: 2013-08-21 | Discharge: 2013-08-22 | Disposition: A | Discharge status: Home / Self Care | Payer: Medicaid Other | Attending: Emergency Medicine | Admitting: Emergency Medicine

## 2013-08-21 DIAGNOSIS — F3130 Bipolar disorder, current episode depressed, mild or moderate severity, unspecified: Secondary | ICD-10-CM | POA: Insufficient documentation

## 2013-08-21 DIAGNOSIS — R945 Abnormal results of liver function studies: Secondary | ICD-10-CM

## 2013-08-21 DIAGNOSIS — R319 Hematuria, unspecified: Secondary | ICD-10-CM | POA: Insufficient documentation

## 2013-08-21 DIAGNOSIS — Z792 Long term (current) use of antibiotics: Secondary | ICD-10-CM | POA: Insufficient documentation

## 2013-08-21 DIAGNOSIS — Z87442 Personal history of urinary calculi: Secondary | ICD-10-CM | POA: Insufficient documentation

## 2013-08-21 DIAGNOSIS — Z79899 Other long term (current) drug therapy: Secondary | ICD-10-CM | POA: Insufficient documentation

## 2013-08-21 DIAGNOSIS — R1011 Right upper quadrant pain: Secondary | ICD-10-CM | POA: Insufficient documentation

## 2013-08-21 DIAGNOSIS — M549 Dorsalgia, unspecified: Secondary | ICD-10-CM | POA: Insufficient documentation

## 2013-08-21 DIAGNOSIS — E669 Obesity, unspecified: Secondary | ICD-10-CM | POA: Insufficient documentation

## 2013-08-21 DIAGNOSIS — R7989 Other specified abnormal findings of blood chemistry: Secondary | ICD-10-CM | POA: Insufficient documentation

## 2013-08-21 DIAGNOSIS — R109 Unspecified abdominal pain: Secondary | ICD-10-CM

## 2013-08-21 DIAGNOSIS — R112 Nausea with vomiting, unspecified: Secondary | ICD-10-CM | POA: Insufficient documentation

## 2013-08-21 DIAGNOSIS — F17200 Nicotine dependence, unspecified, uncomplicated: Secondary | ICD-10-CM | POA: Insufficient documentation

## 2013-08-21 DIAGNOSIS — Z87440 Personal history of urinary (tract) infections: Secondary | ICD-10-CM | POA: Insufficient documentation

## 2013-08-21 DIAGNOSIS — G43909 Migraine, unspecified, not intractable, without status migrainosus: Secondary | ICD-10-CM | POA: Insufficient documentation

## 2013-08-21 DIAGNOSIS — R509 Fever, unspecified: Secondary | ICD-10-CM | POA: Insufficient documentation

## 2013-08-21 DIAGNOSIS — Z3202 Encounter for pregnancy test, result negative: Secondary | ICD-10-CM | POA: Insufficient documentation

## 2013-08-21 LAB — POC URINE PREG, ED: PREG TEST UR: NEGATIVE

## 2013-08-21 NOTE — ED Notes (Signed)
Pt states she was here on march 10th,  Diagnosed with trichomonas, and uti, and bladder infection,  States she was given percocet and two antibiotics, and she states she has been vomiting,  And hurting really bad,  Feels like her insides are coming out.  Pt is tearful,  Says she has hurt for 3 days like this.

## 2013-08-21 NOTE — ED Provider Notes (Signed)
I saw and evaluated the patient, reviewed the resident's note and I agree with the findings and plan.   EKG Interpretation None      Patient with flank pain and fevers at home. Per exam is Pyelo vs PID. Pain controlled in ED. Able to take po, otherwise no indication for admission. Will treat with Abx and encourage close f/u.  Audree CamelScott T Axyl Sitzman, MD 08/21/13 1134

## 2013-08-22 ENCOUNTER — Encounter (HOSPITAL_COMMUNITY): Payer: Self-pay | Admitting: Emergency Medicine

## 2013-08-22 ENCOUNTER — Emergency Department (HOSPITAL_COMMUNITY)
Admission: EM | Admit: 2013-08-22 | Discharge: 2013-08-23 | Disposition: A | Discharge status: Home / Self Care | Payer: Medicaid Other | Source: Home / Self Care | Attending: Emergency Medicine | Admitting: Emergency Medicine

## 2013-08-22 ENCOUNTER — Emergency Department (HOSPITAL_COMMUNITY): Payer: Medicaid Other

## 2013-08-22 DIAGNOSIS — G43909 Migraine, unspecified, not intractable, without status migrainosus: Secondary | ICD-10-CM | POA: Insufficient documentation

## 2013-08-22 DIAGNOSIS — Z87442 Personal history of urinary calculi: Secondary | ICD-10-CM

## 2013-08-22 DIAGNOSIS — F3130 Bipolar disorder, current episode depressed, mild or moderate severity, unspecified: Secondary | ICD-10-CM | POA: Insufficient documentation

## 2013-08-22 DIAGNOSIS — Z79899 Other long term (current) drug therapy: Secondary | ICD-10-CM

## 2013-08-22 DIAGNOSIS — E669 Obesity, unspecified: Secondary | ICD-10-CM

## 2013-08-22 DIAGNOSIS — Z8619 Personal history of other infectious and parasitic diseases: Secondary | ICD-10-CM

## 2013-08-22 DIAGNOSIS — Z87440 Personal history of urinary (tract) infections: Secondary | ICD-10-CM

## 2013-08-22 DIAGNOSIS — R7989 Other specified abnormal findings of blood chemistry: Secondary | ICD-10-CM

## 2013-08-22 DIAGNOSIS — E119 Type 2 diabetes mellitus without complications: Secondary | ICD-10-CM

## 2013-08-22 DIAGNOSIS — F17200 Nicotine dependence, unspecified, uncomplicated: Secondary | ICD-10-CM | POA: Insufficient documentation

## 2013-08-22 DIAGNOSIS — F4310 Post-traumatic stress disorder, unspecified: Secondary | ICD-10-CM

## 2013-08-22 DIAGNOSIS — Z792 Long term (current) use of antibiotics: Secondary | ICD-10-CM | POA: Insufficient documentation

## 2013-08-22 DIAGNOSIS — R1011 Right upper quadrant pain: Secondary | ICD-10-CM

## 2013-08-22 DIAGNOSIS — R945 Abnormal results of liver function studies: Secondary | ICD-10-CM

## 2013-08-22 HISTORY — DX: Obesity, unspecified: E66.9

## 2013-08-22 HISTORY — DX: Urinary tract infection, site not specified: N39.0

## 2013-08-22 LAB — URINALYSIS, ROUTINE W REFLEX MICROSCOPIC
Bilirubin Urine: NEGATIVE
KETONES UR: NEGATIVE mg/dL
LEUKOCYTES UA: NEGATIVE
Nitrite: NEGATIVE
Protein, ur: NEGATIVE mg/dL
Specific Gravity, Urine: 1.025 (ref 1.005–1.030)
UROBILINOGEN UA: 1 mg/dL (ref 0.0–1.0)
pH: 6 (ref 5.0–8.0)

## 2013-08-22 LAB — COMPREHENSIVE METABOLIC PANEL
ALBUMIN: 2.8 g/dL — AB (ref 3.5–5.2)
ALT: 392 U/L — ABNORMAL HIGH (ref 0–35)
ALT: 443 U/L — ABNORMAL HIGH (ref 0–35)
AST: 392 U/L — ABNORMAL HIGH (ref 0–37)
AST: 479 U/L — ABNORMAL HIGH (ref 0–37)
Albumin: 2.8 g/dL — ABNORMAL LOW (ref 3.5–5.2)
Alkaline Phosphatase: 148 U/L — ABNORMAL HIGH (ref 39–117)
Alkaline Phosphatase: 158 U/L — ABNORMAL HIGH (ref 39–117)
BUN: 11 mg/dL (ref 6–23)
BUN: 8 mg/dL (ref 6–23)
CO2: 22 mEq/L (ref 19–32)
CO2: 24 mEq/L (ref 19–32)
Calcium: 9.2 mg/dL (ref 8.4–10.5)
Calcium: 9.3 mg/dL (ref 8.4–10.5)
Chloride: 95 mEq/L — ABNORMAL LOW (ref 96–112)
Chloride: 96 mEq/L (ref 96–112)
Creatinine, Ser: 0.57 mg/dL (ref 0.50–1.10)
Creatinine, Ser: 0.72 mg/dL (ref 0.50–1.10)
GFR calc Af Amer: >90 mL/min (ref >90)
GFR calc Af Amer: >90 mL/min (ref >90)
GFR calc non Af Amer: >90 mL/min (ref >90)
GFR calc non Af Amer: >90 mL/min (ref >90)
Glucose, Bld: 403 mg/dL — ABNORMAL HIGH (ref 70–99)
Glucose, Bld: 461 mg/dL — ABNORMAL HIGH (ref 70–99)
Potassium: 4.2 mEq/L (ref 3.7–5.3)
Potassium: 4.4 mEq/L (ref 3.7–5.3)
Sodium: 133 mEq/L — ABNORMAL LOW (ref 137–147)
Sodium: 134 mEq/L — ABNORMAL LOW (ref 137–147)
TOTAL PROTEIN: 6.7 g/dL (ref 6.0–8.3)
Total Bilirubin: 0.7 mg/dL (ref 0.3–1.2)
Total Bilirubin: 0.8 mg/dL (ref 0.3–1.2)
Total Protein: 6.5 g/dL (ref 6.0–8.3)

## 2013-08-22 LAB — CBC WITH DIFFERENTIAL/PLATELET
Basophils Absolute: 0 10*3/uL (ref 0.0–0.1)
Basophils Relative: 0 % (ref 0–1)
Eosinophils Absolute: 0.2 10*3/uL (ref 0.0–0.7)
Eosinophils Relative: 4 % (ref 0–5)
HCT: 38.9 % (ref 36.0–46.0)
Hemoglobin: 12.8 g/dL (ref 12.0–15.0)
Lymphocytes Relative: 45 % (ref 12–46)
Lymphs Abs: 1.9 10*3/uL (ref 0.7–4.0)
MCH: 25.8 pg — ABNORMAL LOW (ref 26.0–34.0)
MCHC: 32.9 g/dL (ref 30.0–36.0)
MCV: 78.4 fL (ref 78.0–100.0)
Monocytes Absolute: 0.4 10*3/uL (ref 0.1–1.0)
Monocytes Relative: 8 % (ref 3–12)
Neutro Abs: 1.8 10*3/uL (ref 1.7–7.7)
Neutrophils Relative %: 43 % (ref 43–77)
Platelets: 188 10*3/uL (ref 150–400)
RBC: 4.96 MIL/uL (ref 3.87–5.11)
RDW: 16.4 % — ABNORMAL HIGH (ref 11.5–15.5)
WBC: 4.3 10*3/uL (ref 4.0–10.5)

## 2013-08-22 LAB — URINE MICROSCOPIC-ADD ON

## 2013-08-22 LAB — CBC
HEMATOCRIT: 38.5 % (ref 36.0–46.0)
HEMOGLOBIN: 12.9 g/dL (ref 12.0–15.0)
MCH: 26 pg (ref 26.0–34.0)
MCHC: 33.5 g/dL (ref 30.0–36.0)
MCV: 77.6 fL — ABNORMAL LOW (ref 78.0–100.0)
Platelets: 195 10*3/uL (ref 150–400)
RBC: 4.96 MIL/uL (ref 3.87–5.11)
RDW: 16.1 % — AB (ref 11.5–15.5)
WBC: 5.7 10*3/uL (ref 4.0–10.5)

## 2013-08-22 LAB — LIPASE, BLOOD
LIPASE: 33 U/L (ref 11–59)
Lipase: 44 U/L (ref 11–59)

## 2013-08-22 MED ORDER — SODIUM CHLORIDE 0.9 % IV BOLUS (SEPSIS)
1000.0000 mL | Freq: Once | INTRAVENOUS | Status: AC
  Administered 2013-08-22: 1000 mL via INTRAVENOUS

## 2013-08-22 MED ORDER — HYDROMORPHONE HCL PF 1 MG/ML IJ SOLN
1.0000 mg | Freq: Once | INTRAMUSCULAR | Status: AC
  Administered 2013-08-22: 1 mg via INTRAVENOUS

## 2013-08-22 MED ORDER — OXYCODONE-ACETAMINOPHEN 5-325 MG PO TABS
2.0000 | ORAL_TABLET | Freq: Once | ORAL | Status: AC
Start: 2013-08-22 — End: 2013-08-22
  Administered 2013-08-22: 2 via ORAL
  Filled 2013-08-22: qty 2

## 2013-08-22 MED ORDER — PROMETHAZINE HCL 25 MG PO TABS
25.0000 mg | ORAL_TABLET | Freq: Once | ORAL | Status: AC
  Administered 2013-08-22: 25 mg via ORAL
  Filled 2013-08-22: qty 1

## 2013-08-22 MED ORDER — FAMOTIDINE 20 MG PO TABS
40.0000 mg | ORAL_TABLET | Freq: Once | ORAL | Status: AC
  Administered 2013-08-22: 40 mg via ORAL
  Filled 2013-08-22: qty 2

## 2013-08-22 MED ORDER — HYDROMORPHONE HCL PF 1 MG/ML IJ SOLN
1.0000 mg | Freq: Once | INTRAMUSCULAR | Status: DC
  Filled 2013-08-22: qty 1

## 2013-08-22 MED ORDER — HYDROMORPHONE HCL PF 1 MG/ML IJ SOLN
1.0000 mg | Freq: Once | INTRAMUSCULAR | Status: AC
  Administered 2013-08-22: 1 mg via INTRAVENOUS
  Filled 2013-08-22: qty 1

## 2013-08-22 MED ORDER — PROMETHAZINE HCL 25 MG PO TABS: 25.0000 mg | ORAL_TABLET | Freq: Four times a day (QID) | ORAL | Status: DC | PRN

## 2013-08-22 MED ORDER — INSULIN ASPART 100 UNIT/ML ~~LOC~~ SOLN
10.0000 [IU] | Freq: Once | SUBCUTANEOUS | Status: AC
  Administered 2013-08-22: 10 [IU] via INTRAVENOUS
  Filled 2013-08-22: qty 1

## 2013-08-22 MED ORDER — ONDANSETRON HCL 4 MG/2ML IJ SOLN
4.0000 mg | Freq: Once | INTRAMUSCULAR | Status: AC
  Administered 2013-08-22: 4 mg via INTRAVENOUS
  Filled 2013-08-22: qty 2

## 2013-08-22 MED ORDER — OXYCODONE-ACETAMINOPHEN 7.5-325 MG PO TABS: 1.0000 | ORAL_TABLET | Freq: Four times a day (QID) | ORAL | Status: DC | PRN

## 2013-08-22 MED ORDER — GI COCKTAIL ~~LOC~~
30.0000 mL | Freq: Once | ORAL | Status: AC
  Administered 2013-08-22: 30 mL via ORAL
  Filled 2013-08-22: qty 30

## 2013-08-22 NOTE — ED Notes (Signed)
Patient requested to speak with charge nurse for information on assistance with medication costs and for assistance with transportation home. Advised patient I am not able to arrange transportation for her but that she is welcome to wait in the lobby until she can find a ride. [place] who [place] cared for patient, received patient resource guide. Entered lobby to provide patient with said resource guide and patient was found to be walking across parking lot towards [address]. Patient did not turn around when called. Resource guide left at registration window, requested registration staff to please give this to the patient in the event she returned to the lobby.

## 2013-08-22 NOTE — Discharge Instructions (Signed)
Recommend a followup with your urologist as your symptoms may be secondary to a kidney stone. Continue taking the antibiotics prescribed you at your last visit. You may take Percocet as prescribed for pain control. Take Phenergan as prescribed for nausea/vomiting. Followup with your primary care doctor for a recheck of your sugar levels as well as a recheck of your liver function tests. Return to the emergency department if symptoms worsen.  Abdominal Pain, Adult Many things can cause abdominal pain. Usually, abdominal pain is not caused by a disease and will improve without treatment. It can often be observed and treated at home. Your health care provider will do a physical exam and possibly order blood tests and X-rays to help determine the seriousness of your pain. However, in many cases, more time must pass before a clear cause of the pain can be found. Before that point, your health care provider may not know if you need more testing or further treatment. HOME CARE INSTRUCTIONS  Monitor your abdominal pain for any changes. The following actions may help to alleviate any discomfort you are experiencing:  Only take over-the-counter or prescription medicines as directed by your health care provider.  Do not take laxatives unless directed to do so by your health care provider.  Try a clear liquid diet (broth, tea, or water) as directed by your health care provider. Slowly move to a bland diet as tolerated. SEEK MEDICAL CARE IF:  You have unexplained abdominal pain.  You have abdominal pain associated with nausea or diarrhea.  You have pain when you urinate or have a bowel movement.  You experience abdominal pain that wakes you in the night.  You have abdominal pain that is worsened or improved by eating food.  You have abdominal pain that is worsened with eating fatty foods. SEEK IMMEDIATE MEDICAL CARE IF:   Your pain does not go away within 2 hours.  You have a fever.  You keep  throwing up (vomiting).  Your pain is felt only in portions of the abdomen, such as the right side or the left lower portion of the abdomen.  You pass bloody or black tarry stools. MAKE SURE YOU:  Understand these instructions.   Will watch your condition.   Will get help right away if you are not doing well or get worse.  Document Released: 03/07/2005 Document Revised: 03/18/2013 Document Reviewed: 02/04/2013 [facility] Patient Information 2014 [place], [place].   Emergency Department Resource Guide 1) Find a Doctor and Pay Out of Pocket Although you won't have to find out who is covered by your insurance plan, it is a good idea to ask around and get recommendations. You will then need to call the office and see if the doctor you have chosen will accept you as a new patient and what types of options they offer for patients who are self-pay. Some doctors offer discounts or will set up payment plans for their patients who do not have insurance, but you will need to ask so you aren't surprised when you get to your appointment.  2) Contact Your Local Health Department Not all health departments have doctors that can see patients for sick visits, but many do, so it is worth a call to see if yours does. If you don't know where your local health department is, you can check in your phone book. The CDC also has a tool to help you locate your state's health department, and many state websites also have listings of all of their  local health departments.  3) Find a Walk-in Clinic If your illness is not likely to be very severe or complicated, you may want to try a walk in clinic. These are popping up all over the country in pharmacies, drugstores, and shopping centers. They're usually staffed by nurse practitioners or physician assistants that have been trained to treat common illnesses and complaints. They're usually fairly quick and inexpensive. However, if you have serious medical issues or chronic  medical problems, these are probably not your best option.  No Primary Care Doctor: - Call Health Connect at  [phone] - they can help you locate a primary care doctor that  accepts your insurance, provides certain services, etc. - Physician Referral Service- [phone]  Chronic Pain Problems: Organization         Address  Phone   Notes  [facility]  [phone] Patients need to be referred by their primary care doctor.   Medication Assistance: Organization         Address  Phone   Notes  [facility] Medication [facility] [address] [place] [place], [place] [address] [phone] --Must be a resident of [facility] -- Must have NO insurance coverage whatsoever (no Medicaid/ Medicare, etc.) -- The pt. MUST have a primary care doctor that directs their care regularly and follows them in the community   [facility]  [phone]   [employer]  [phone]    Agencies that provide inexpensive medical care: Organization         Address  Phone   Notes  [facility]  [phone]   [facility]    [phone]   [facility] [address] [place], [place] [phone]   [facility] [address] [place] [place] [phone]   Planned Parenthood    [phone]   [facility]    [phone]   Community [facility] and [facility]  [address], [place] Phone:  [phone], Fax:  [phone] Hours of Operation:  9 am - 6 pm, M-F.  Also accepts Medicaid/Medicare and self-pay.  [facility]  [address], [place] Phone: [phone], Fax: [phone]. Hours of Operation:  8:30 am - 5:30 pm, M-F.  Also accepts Medicaid and self-pay.  [facility] [place] [address], [place] Phone: [phone]   [facility] [address] Natasha Bence [place], [place] [phone],  Ext. [phone] Mondays & Thursdays: 7-9 AM.  First 15 patients are seen on a first come, first serve basis.    Medicaid-accepting [facility] Providers:  Organization         Address  Phone   Notes  [facility] [address], [place] [phone] Also accepts self-pay patients.  [facility] [address] Laurell Josephs [place], [place]  [phone]   [facility] [address], [place] [phone]   [facility] [address] [place] [phone]   Renaye Rakers [address], [place]   [phone] Only accepts [place] patients after they have their name applied to their card.   Self-Pay (no insurance) in [facility]:  Organization         Address  Phone   Notes  Sickle Cell Patients,  [facility] [address] [place], [place] [phone]   [facility] [address] [place], [place] [phone]   [facility] [place]  [address], [place] [phone]   [facility]/Dr. Osei-Bonsu  [address] [place] or [address], [place] [phone] Phone number for both [place] and [place] locations is the same.  [facility] and [facility] [address] [place] [phone]   [facility] [address], [place] or [address] [phone]   [facility] [address] [place] [phone], phone; [phone], fax Sees patients 1st and 3rd Saturday of every month.  Must not qualify for public or private insurance (i.e. Medicaid, Medicare, [place] Health Choice, Veterans' Benefits)  Household income should be no more than 200% of the poverty level The clinic cannot treat you if you are pregnant or think you are pregnant  Sexually transmitted diseases are not  treated at the clinic.    Dental Care: Organization         Address  Phone  Notes  [facility] Department of [facility] [address] [place], [place] [phone] Accepts children up to age 59 who are enrolled in [place] or [place] Health Choice; pregnant women with a Medicaid card; and children who have applied for Medicaid or [place] Health Choice, but were declined, whose parents can pay a reduced fee at time of service.  [facility]  [address], [place] [phone] Accepts children up to age 67 who are enrolled in [place] or [place] Health Choice; pregnant women with a Medicaid card; and children who have applied for Medicaid or [place] Health Choice, but were declined, whose parents can pay a reduced fee at time of service.  [facility]  [address] [place], [place] [phone] Patients are seen by appointment only. Walk-ins are not accepted. [facility] will see patients 67 years of age and older. Monday - Tuesday (8am-5pm) Most Wednesdays (8:30-5pm) $30 per visit, cash only  [facility]  [address], [facility] [place] [phone] Patients are seen by appointment only. Walk-ins are not accepted. [facility] will see patients 23 years of age and older. One Wednesday Evening (Monthly: Volunteer Based).  $30 per visit, cash only  [employer]  [phone] for adults; Children under age 44, call Graduate Pediatric Dentistry at [phone]. Children aged 46-14, please call [phone] to request a pediatric application.  Dental services are provided in all areas of dental care including fillings, crowns and bridges, complete and partial dentures, implants, gum treatment, root canals, and extractions. Preventive care is also provided. Treatment is provided to both adults and children. Patients are selected via a lottery and there is  often a waiting list.   [facility] [address] [place]  [phone] [email or web address]   [facility] [address] [place], [place] [phone], Ext. [phone] Second and Fourth Thursday of each month, opens at 6:30 AM; Clinic ends at 9 AM.  Patients are seen on a first-come first-served basis, and a limited number are seen during each clinic.   [facility]  [address] Ether Griffins [place], [place] [phone]   Eligibility Requirements You must  have lived in [place], [place], or [place] counties for at least the last three months.   You cannot be eligible for state or federal sponsored [place] Cityhealthcare insurance, including CIG[employer]eterans Administration, [place]icaid, or [employer]icare.   You generally cannot be eligible for healthcare insurance through your employer.    How to apply: Eligibility screenings are held every Tuesday and Wednesday afternoon from 1:00 pm until 4:00 pm. You do not need an appointment for the interview!  [facility] [address], [place], [place] [phone]   [facility]  [phone]   [facility]  [phone]   [facility]  [phone]    Behavioral Health Resources in the Community: Intensive Outpatient Programs Organization         Address  Phone  Notes  [facility] [address], [place], [place] [phone]   [facility] [address], [place], [place] [phone]   [facility] [address], [place], [place]  [phone]   [facility] [address],  [place], [place] [phone] or [phone]   Substance Abuse Resources Organization         Address  Phone  Notes  [facility]  [phone]   [facility]  [phone]   [facility]  [phone]   [facility]  [phone]   Residential & Outpatient Substance  Abuse Program  [phone]   Psychological Services Organization         Address  Phone  Notes  [facility]  [phone]   [facility]  [phone]- [phone]   [facility] [address], [place] [phone] or [phone]    Mobile Crisis Teams Organization         Address  Phone  Notes  [facility], Mobile Crisis Care Unit  [phone]   [facility]  [address] [place], [place] [phone]   Doristine LocksSharon DeEsch [address] [place] [phone]    Self-Help/Support Groups Organization         Address  Phone             Notes  [facility] [place] - variety of support groups  [phone] Call for more information  Narcotics Anonymous (NA), [facility] [address], [place]  2 meetings at this location   Statisticianesidential Treatment Programs Organization         Address  Phone  Notes  [facility] [address],    [place]  [phone]   [facility]  [address], [place] [phone], [place], [place] [phone]   [facility] [address], [place] [phone] Admissions: 8am-3pm M-F  [facility] [address]    [place], [place] [phone]   [facility] [address], [place], [place] [phone]   [facility] [address]  [place], [place] [phone]   [facility] - Intensive Outpatient [address], [place], [place] [phone]   [facility] ([facility] [address]  [place], [place] [phone] or [phone]   [facility] ([facility]) [address] [place], [place] [phone] Accepts Medicaid  [facility] [address]  [place] [phone] Substance Abuse/Addiction Treatment   [facility] Resources Organization          Address  Phone  Notes  [facility]  [phone]   Angie Fava, PhD [address] Ervin Knack [place], [place]   [phone] or [phone]   [facility] Behavioral   [address] [place], [place] [phone]   [facility] [address], [place], [place] [phone] Insurance/Medicaid/sponsorship through [facility] and Families [address]                                    [place], [place] [phone] Therapy/tele-psych/case  [facility] [address], [place] [phone]    Dr. Lolly Mustache  [phone]   [facility]  [employer] [facility] Dept. 1) [address] [place] 2) [address], [place] 3)  [address], [place] [phone]  [phone]   [facility] Child Abuse Hotline 4702274043 or [phone] (After Hours)

## 2013-08-22 NOTE — ED Provider Notes (Signed)
CSN: [id number]     Arrival date & time 08/22/13  2113 History   First MD Initiated Contact with Patient 08/22/13 2207     Chief Complaint  Patient presents with  . Abdominal Pain     (Consider location/radiation/quality/duration/timing/severity/associated sxs/prior Treatment) HPI  28 year old female with abdominal pain. Right upper quadrant. Patient has been evaluated times within the past 2 days for her abdominal pain. Previous workups, imaging, labs and notes reviewed. Some concern for possible drug-seeking behavior. Patient states that her pain has not acutely changed since her last evaluation. Head has not significantly improved though. Is a history of IV drug use. Reports taking medications as prescribed. No diarrhea. No acute urinary complaints. Subjective fever. Has not actually taken her temperature. No chills. No unintentional weight loss. Denies any history of IV drug abuse.  Past Medical History  Diagnosis Date  . Depression   . Bipolar 1 disorder, depressed   . PTSD (post-traumatic stress disorder)   . Kidney stone   . Migraine   . Urinary tract infection   . Pregnancy induced hypertension   . Obesity   . UTI (lower urinary tract infection)    Past Surgical History  Procedure Laterality Date  . Kidney stones    . Lipotripsy     Family History  Problem Relation Age of Onset  . Rheum arthritis Mother   . Asthma Mother   . Hypertension Mother   . Other Neg Hx    History  Substance Use Topics  . Smoking status: Current Every Day Smoker -- 1.00 packs/day    Types: Cigarettes  . Smokeless tobacco: Never Used  . Alcohol Use: No   OB History   Grav Para Term Preterm Abortions TAB SAB Ect Mult Living   3 2 2  1  1   2      Review of Systems  All systems reviewed and negative, other than as noted in HPI.   Allergies  Darvocet; Morphine and related; Sulfa antibiotics; and Tomato  Home Medications   Current Outpatient Rx  Name  Route  Sig  Dispense  Refill   . acetaminophen (TYLENOL) 500 MG tablet   Oral   Take 500 mg by mouth every 6 (six) hours as needed for moderate pain.         . cephALEXin (KEFLEX) 500 MG capsule   Oral   Take 1 capsule (500 mg total) by mouth 4 (four) times daily.   30 capsule   0   . clonazePAM (KLONOPIN) 1 MG tablet   Oral   Take 1 mg by mouth 2 (two) times daily.         Marland Kitchen doxycycline (VIBRAMYCIN) 50 MG capsule   Oral   Take 2 capsules (100 mg total) by mouth 2 (two) times daily.   28 capsule   0   . HYDROcodone-acetaminophen (NORCO) 10-325 MG per tablet   Oral   Take 1 tablet by mouth every 6 (six) hours as needed for moderate pain.         Marland Kitchen ibuprofen (ADVIL,MOTRIN) 200 MG tablet   Oral   Take 600 mg by mouth every 6 (six) hours as needed for pain or headache.         . Lurasidone HCl (LATUDA) 20 MG TABS   Oral   Take 20 mg by mouth every morning.          . ondansetron (ZOFRAN) 4 MG tablet   Oral   Take 1 tablet (4  mg total) by mouth every 8 (eight) hours as needed for nausea or vomiting.   20 tablet   0   . pregabalin (LYRICA) 75 MG capsule   Oral   Take 75 mg by mouth 3 (three) times daily.          . Prenatal Vit-Fe Fumarate-FA (PRENATAL MULTIVITAMIN) TABS tablet   Oral   Take 1 tablet by mouth every morning.          Marland Kitchen. oxyCODONE-acetaminophen (PERCOCET) 7.5-325 MG per tablet   Oral   Take 1 tablet by mouth every 6 (six) hours as needed for pain.   17 tablet   0   . promethazine (PHENERGAN) 25 MG tablet   Oral   Take 1 tablet (25 mg total) by mouth every 6 (six) hours as needed for nausea or vomiting.   12 tablet   0    BP 131/61  Pulse 88  Temp(Src) 98.4 F (36.9 C) (Oral)  Resp 18  SpO2 97%  LMP 08/01/2013 Physical Exam  Nursing note and vitals reviewed. Constitutional: She appears well-developed and well-nourished. No distress.  HENT:  Head: Normocephalic and atraumatic.  Eyes: Conjunctivae are normal. Right eye exhibits no discharge. Left eye  exhibits no discharge.  Neck: Neck supple.  Cardiovascular: Normal rate, regular rhythm and normal heart sounds.  Exam reveals no gallop and no friction rub.   No murmur heard. Pulmonary/Chest: Effort normal and breath sounds normal. No respiratory distress.  Abdominal: Soft. She exhibits no distension. There is tenderness. There is no rebound and no guarding.  Tenderness in right upper quadrant without rebound or guarding. Exam is hindered by patient's body habitus. I could not appreciate any hepatomegaly though.  Genitourinary:  No CVA tenderness.  Musculoskeletal: She exhibits no edema and no tenderness.  Neurological: She is alert.  Skin: Skin is warm and dry.  Psychiatric: Her behavior is normal. Thought content normal.    ED Course  Procedures (including critical care time) Labs Review Labs Reviewed  CBC WITH DIFFERENTIAL - Abnormal; Notable for the following:    MCH 25.8 (*)    RDW 16.4 (*)    All other components within normal limits  COMPREHENSIVE METABOLIC PANEL  LIPASE, BLOOD  HEPATITIS PANEL, ACUTE   Imaging Review [place] Abdomen Complete  08/22/2013   CLINICAL DATA:  Right upper quadrant pain  EXAM: ULTRASOUND ABDOMEN COMPLETE  COMPARISON:  CT abdomen 08/18/2013  FINDINGS: Gallbladder:  No gallstones or wall thickening visualized. No sonographic Murphy sign noted.  Common bile duct:  Diameter: 3.8 mm  Liver:  Echogenic liver without focal mass lesion  IVC:  No abnormality visualized.  Pancreas:  Visualized portion unremarkable.  Spleen:  Size and appearance within normal limits.  Right Kidney:  Length: 13.6 cm. Echogenicity within normal limits. No mass or hydronephrosis visualized.  Left Kidney:  Length: 12.9 cm. Echogenicity within normal limits. No mass or hydronephrosis visualized.  Abdominal aorta:  No aneurysm visualized.  Other findings:  Image quality limited by patient's size and bowel gas.  IMPRESSION: Negative for gallstones.  Fatty infiltration of liver.  No acute  abnormality.   Electronically Signed   By: Marlan Palauharles  Clark M.D.   On: 08/22/2013 01:39   Ct Abdomen Pelvis Wo Contrast  08/18/2013   CLINICAL DATA Right flank pain  EXAM CT ABDOMEN AND PELVIS WITHOUT CONTRAST  TECHNIQUE Multidetector CT imaging of the abdomen and pelvis was performed following the standard protocol without intravenous contrast.  COMPARISON None.  FINDINGS There  is diffuse fatty infiltration of liver. The spleen, pancreas, gallbladder, adrenal glands and kidneys are normal. There is no nephrolithiasis or hydroureteronephrosis bilaterally. The aorta is normal. There is no abdominal lymphadenopathy. There is no small bowel obstruction or diverticulitis. The appendix is not seen but no inflammation is noted around the cecum.  Fluid-filled bladder is normal. Fluid is identified within the endometrial cavity normal for age. There is no pelvic lymphadenopathy. The lung bases are clear. Mild degenerative joint changes are identified in the lower thoracic spine.  IMPRESSION No nephrolithiasis or hydroureteronephrosis bilaterally. No acute abnormality identified in the abdomen and pelvis.  SIGNATURE  Electronically Signed   By: Sherian Rein M.D.   On: 08/18/2013 16:31   US Abdomen Complete  08/22/2013   CLINICAL DATA:  Right upper quadrant pain  EXAM: ULTRASOUND ABDOMEN COMPLETE  COMPARISON:  CT abdomen 08/18/2013  FINDINGS: Gallbladder:  No gallstones or wall thickening visualized. No sonographic Murphy sign noted.  Common bile duct:  Diameter: 3.8 mm  Liver:  Echogenic liver without focal mass lesion  IVC:  No abnormality visualized.  Pancreas:  Visualized portion unremarkable.  Spleen:  Size and appearance within normal limits.  Right Kidney:  Length: 13.6 cm. Echogenicity within normal limits. No mass or hydronephrosis visualized.  Left Kidney:  Length: 12.9 cm. Echogenicity within normal limits. No mass or hydronephrosis visualized.  Abdominal aorta:  No aneurysm visualized.  Other findings:  Image  quality limited by patient's size and bowel gas.  IMPRESSION: Negative for gallstones.  Fatty infiltration of liver.  No acute abnormality.   Electronically Signed   By: Marlan Palau M.D.   On: 08/22/2013 01:39   US Transvaginal Non-ob  08/18/2013   CLINICAL DATA Pelvic pain ; LMP 07/31/2013  EXAM TRANSABDOMINAL AND TRANSVAGINAL ULTRASOUND OF PELVIS  TECHNIQUE Both transabdominal and transvaginal ultrasound examinations of the pelvis were performed. Transabdominal technique was performed for global imaging of the pelvis including uterus, ovaries, adnexal regions, and pelvic cul-de-sac. It was necessary to proceed with endovaginal exam following the transabdominal exam to visualize the uterus, endometrium, ovaries and adnexae. Transabdominal imaging is severely limited by inadequate bladder distention and poor acoustic window.  COMPARISON CT abdomen and pelvis 08/18/2013  FINDINGS Uterus  Measurements: 8.6 x 5.2 x 6.1 cm. Normal morphology without mass.  Endometrium  Thickness: 17 mm thick. Question minimal endometrial fluid at upper uterine segment. No other focal abnormalities.  Right ovary  Measurements: 2.8 x 1.3 x 1.9 cm. Normal morphology without mass. Internal blood flow present on color Doppler imaging. Small partially collapsed follicle cysts noted.  Left ovary  Measurements: 4.2 x 2.1 x 2.4 cm. Normal morphology with a dominant follicle cyst 3.4 x 1.9 x 1.6 cm  Other findings  No free pelvic fluid or additional adnexal masses.  IMPRESSION Small left ovarian follicle cyst 3.4 cm greatest size.  Upper normal thickness of the endometrial complex at 17 mm thick questionably containing a tiny amount of fluid.  Otherwise negative exam.  SIGNATURE  Electronically Signed   By: Ulyses Southward M.D.   On: 08/18/2013 18:20   US Pelvis Complete  08/18/2013   CLINICAL DATA Pelvic pain ; LMP 07/31/2013  EXAM TRANSABDOMINAL AND TRANSVAGINAL ULTRASOUND OF PELVIS  TECHNIQUE Both transabdominal and transvaginal  ultrasound examinations of the pelvis were performed. Transabdominal technique was performed for global imaging of the pelvis including uterus, ovaries, adnexal regions, and pelvic cul-de-sac. It was necessary to proceed with endovaginal exam following the transabdominal exam to visualize  the uterus, endometrium, ovaries and adnexae. Transabdominal imaging is severely limited by inadequate bladder distention and poor acoustic window.  COMPARISON CT abdomen and pelvis 08/18/2013  FINDINGS Uterus  Measurements: 8.6 x 5.2 x 6.1 cm. Normal morphology without mass.  Endometrium  Thickness: 17 mm thick. Question minimal endometrial fluid at upper uterine segment. No other focal abnormalities.  Right ovary  Measurements: 2.8 x 1.3 x 1.9 cm. Normal morphology without mass. Internal blood flow present on color Doppler imaging. Small partially collapsed follicle cysts noted.  Left ovary  Measurements: 4.2 x 2.1 x 2.4 cm. Normal morphology with a dominant follicle cyst 3.4 x 1.9 x 1.6 cm  Other findings  No free pelvic fluid or additional adnexal masses.  IMPRESSION Small left ovarian follicle cyst 3.4 cm greatest size.  Upper normal thickness of the endometrial complex at 17 mm thick questionably containing a tiny amount of fluid.  Otherwise negative exam.  SIGNATURE  Electronically Signed   By: Ulyses Southward M.D.   On: 08/18/2013 18:20    EKG Interpretation None      MDM   Final diagnoses:  RUQ pain  Abnormal LFTs  Diabetes mellitus, new onset    28 year old female with right upper quadrant pain. Third ED visit in the past 5 days for the same. She has had a CT scan, right upper quadrant ultrasound and ultrasound of her pelvis which did not show any explaining pathology. I do not feel that repeat imaging is of much utility without acute change in her symptoms since last evaluations. Noted to be hyperglycemic and have elevated LFTs on last ED visit. The same noted today. No metabolic acidosis. Treated with IV  fluids and a dose of insulin. Renal function is normal. Will start patient on metformin. Hepatitis panel was drawn today to aid followup provider. Patient is afebrile and hemodynamically stable. She denies any high-risk activities. Patient reports that she takes her Percocet as prescribed and has not taken any additional Tylenol for pain.  Raeford Razor, MD 08/25/13 [phone]

## 2013-08-22 NOTE — ED Provider Notes (Signed)
Medical screening examination/treatment/procedure(s) were performed by non-physician practitioner and as supervising physician I was immediately available for consultation/collaboration.   EKG Interpretation None       Discussed patient with PA.  Recent visit on 3/10, concern at that time for PID, started on abx.  Recent CT scan without hydronephrosis or other abnormality.  U/s today again without hydronephrosis, no gallstones.  Elevation in glucose, liver enzymes.  Pt received iv dilaudid here, iv fluids.  She was instructed to f/u with pcm for elevated blood sugars (on two recent visits) but no medications started at this time, no signs of ketosis/acidosis.  Pt initially given rx for percocet tonight, but flagged by filling pharmacist for recent rx for vicodin 10/325 #90 tab on 3/5, and given rx for percocet on 3/10 as well.  Tonight's rx cancelled due to concern for ill affects of that much opiate medication as well as tylenol usage.    Olivia Mackielga M Zenas Santa, MD 08/22/13 [phone]

## 2013-08-22 NOTE — ED Provider Notes (Signed)
0100 - Patient care assumed from [facility], PA-C at shift change. Patient presenting today for persistent vomiting and flank pain. Patient currently with labs and ultrasound imaging pending. Plan discussed with [place], PA-C which includes d/c with outpatient follow up if labs and imaging unremarkable.  0300 - Labs today significant for gross hematuria as well as elevated LFTs. Ultrasound imaging negative for acute cholecystitis or cholelithiasis as well as hydronephrosis. I have reviewed these findings with the patient in their entirety. She verbalizes understanding to all of the above. Patient with negative CT scan on 08/18/2013. I do not believe she warrants further emergent workup at this time. Pain fairly well controlled with Dilaudid. Will transition patient to oral pain medicine and discharged home with course of Percocet, Phenergan for nausea/vomiting, and instruction to followup with her urologist. At also advised followup with her primary care provider for a recheck of her LFTs as well as testing for diabetes given her hyperglycemia. Suspect that symptoms today are secondary to a kidney stone not seen on prior CT scan. Return precautions discussed with patient who verbalizes comfort and understanding with this discharge plan with no unaddressed concerns.   Filed Vitals:   08/21/13 2316  BP: 143/108  Pulse: 87  Temp: 97.4 F (36.3 C)  TempSrc: Oral  Resp: 22  Height: 5\' 7"  (1.702 m)  Weight: 361 lb (163.749 kg)  SpO2: 99%   Results for orders placed during the hospital encounter of 08/21/13  URINALYSIS, ROUTINE W REFLEX MICROSCOPIC      Result Value Ref Range   Color, Urine YELLOW  YELLOW   APPearance CLEAR  CLEAR   Specific Gravity, Urine 1.025  1.005 - 1.030   pH 6.0  5.0 - 8.0   Glucose, UA >1000 (*) NEGATIVE mg/dL   Hgb urine dipstick LARGE (*) NEGATIVE   Bilirubin Urine NEGATIVE  NEGATIVE   Ketones, ur NEGATIVE  NEGATIVE mg/dL   Protein, ur NEGATIVE  NEGATIVE  mg/dL   Urobilinogen, UA 1.0  0.0 - 1.0 mg/dL   Nitrite NEGATIVE  NEGATIVE   Leukocytes, UA NEGATIVE  NEGATIVE  CBC      Result Value Ref Range   WBC 5.7  4.0 - 10.5 K/uL   RBC 4.96  3.87 - 5.11 MIL/uL   Hemoglobin 12.9  12.0 - 15.0 g/dL   HCT 16.1  09.6 - 04.5 %   MCV 77.6 (*) 78.0 - 100.0 fL   MCH 26.0  26.0 - 34.0 pg   MCHC 33.5  30.0 - 36.0 g/dL   RDW 40.9 (*) 81.1 - 91.4 %   Platelets 195  150 - 400 K/uL  COMPREHENSIVE METABOLIC PANEL      Result Value Ref Range   Sodium 133 (*) 137 - 147 mEq/L   Potassium 4.2  3.7 - 5.3 mEq/L   Chloride 95 (*) 96 - 112 mEq/L   CO2 22  19 - 32 mEq/L   Glucose, Bld 403 (*) 70 - 99 mg/dL   BUN 8  6 - 23 mg/dL   Creatinine, Ser 7.82  0.50 - 1.10 mg/dL   Calcium 9.2  8.4 - 95.6 mg/dL   Total Protein 6.7  6.0 - 8.3 g/dL   Albumin 2.8 (*) 3.5 - 5.2 g/dL   AST 213 (*) 0 - 37 U/L   ALT 392 (*) 0 - 35 U/L   Alkaline Phosphatase 148 (*) 39 - 117 U/L   Total Bilirubin 0.8  0.3 - 1.2 mg/dL   GFR calc  non Af Amer >90  >90 mL/min   GFR calc Af Amer >90  >90 mL/min  LIPASE, BLOOD      Result Value Ref Range   Lipase 33  11 - 59 U/L  URINE MICROSCOPIC-ADD ON      Result Value Ref Range   RBC / HPF TOO NUMEROUS TO COUNT  <3 RBC/hpf  POC URINE PREG, ED      Result Value Ref Range   Preg Test, Ur NEGATIVE  NEGATIVE   Ct Abdomen Pelvis Wo Contrast  08/18/2013   CLINICAL DATA Right flank pain  EXAM CT ABDOMEN AND PELVIS WITHOUT CONTRAST  TECHNIQUE Multidetector CT imaging of the abdomen and pelvis was performed following the standard protocol without intravenous contrast.  COMPARISON None.  FINDINGS There is diffuse fatty infiltration of liver. The spleen, pancreas, gallbladder, adrenal glands and kidneys are normal. There is no nephrolithiasis or hydroureteronephrosis bilaterally. The aorta is normal. There is no abdominal lymphadenopathy. There is no small bowel obstruction or diverticulitis. The appendix is not seen but no inflammation is noted around  the cecum.  Fluid-filled bladder is normal. Fluid is identified within the endometrial cavity normal for age. There is no pelvic lymphadenopathy. The lung bases are clear. Mild degenerative joint changes are identified in the lower thoracic spine.  IMPRESSION No nephrolithiasis or hydroureteronephrosis bilaterally. No acute abnormality identified in the abdomen and pelvis.  SIGNATURE  Electronically Signed   By: Sherian Rein M.D.   On: 08/18/2013 16:31   US Abdomen Complete  08/22/2013   CLINICAL DATA:  Right upper quadrant pain  EXAM: ULTRASOUND ABDOMEN COMPLETE  COMPARISON:  CT abdomen 08/18/2013  FINDINGS: Gallbladder:  No gallstones or wall thickening visualized. No sonographic Murphy sign noted.  Common bile duct:  Diameter: 3.8 mm  Liver:  Echogenic liver without focal mass lesion  IVC:  No abnormality visualized.  Pancreas:  Visualized portion unremarkable.  Spleen:  Size and appearance within normal limits.  Right Kidney:  Length: 13.6 cm. Echogenicity within normal limits. No mass or hydronephrosis visualized.  Left Kidney:  Length: 12.9 cm. Echogenicity within normal limits. No mass or hydronephrosis visualized.  Abdominal aorta:  No aneurysm visualized.  Other findings:  Image quality limited by patient's size and bowel gas.  IMPRESSION: Negative for gallstones.  Fatty infiltration of liver.  No acute abnormality.   Electronically Signed   By: Marlan Palau M.D.   On: 08/22/2013 01:39   US Transvaginal Non-ob  08/18/2013   CLINICAL DATA Pelvic pain ; LMP 07/31/2013  EXAM TRANSABDOMINAL AND TRANSVAGINAL ULTRASOUND OF PELVIS  TECHNIQUE Both transabdominal and transvaginal ultrasound examinations of the pelvis were performed. Transabdominal technique was performed for global imaging of the pelvis including uterus, ovaries, adnexal regions, and pelvic cul-de-sac. It was necessary to proceed with endovaginal exam following the transabdominal exam to visualize the uterus, endometrium, ovaries and  adnexae. Transabdominal imaging is severely limited by inadequate bladder distention and poor acoustic window.  COMPARISON CT abdomen and pelvis 08/18/2013  FINDINGS Uterus  Measurements: 8.6 x 5.2 x 6.1 cm. Normal morphology without mass.  Endometrium  Thickness: 17 mm thick. Question minimal endometrial fluid at upper uterine segment. No other focal abnormalities.  Right ovary  Measurements: 2.8 x 1.3 x 1.9 cm. Normal morphology without mass. Internal blood flow present on color Doppler imaging. Small partially collapsed follicle cysts noted.  Left ovary  Measurements: 4.2 x 2.1 x 2.4 cm. Normal morphology with a dominant follicle cyst 3.4 x 1.9 x  1.6 cm  Other findings  No free pelvic fluid or additional adnexal masses.  IMPRESSION Small left ovarian follicle cyst 3.4 cm greatest size.  Upper normal thickness of the endometrial complex at 17 mm thick questionably containing a tiny amount of fluid.  Otherwise negative exam.  SIGNATURE  Electronically Signed   By: Ulyses SouthwardMark  Boles M.D.   On: 08/18/2013 18:20   [place] Pelvis Complete  08/18/2013   CLINICAL DATA Pelvic pain ; LMP 07/31/2013  EXAM TRANSABDOMINAL AND TRANSVAGINAL ULTRASOUND OF PELVIS  TECHNIQUE Both transabdominal and transvaginal ultrasound examinations of the pelvis were performed. Transabdominal technique was performed for global imaging of the pelvis including uterus, ovaries, adnexal regions, and pelvic cul-de-sac. It was necessary to proceed with endovaginal exam following the transabdominal exam to visualize the uterus, endometrium, ovaries and adnexae. Transabdominal imaging is severely limited by inadequate bladder distention and poor acoustic window.  COMPARISON CT abdomen and pelvis 08/18/2013  FINDINGS Uterus  Measurements: 8.6 x 5.2 x 6.1 cm. Normal morphology without mass.  Endometrium  Thickness: 17 mm thick. Question minimal endometrial fluid at upper uterine segment. No other focal abnormalities.  Right ovary  Measurements: 2.8 x 1.3 x 1.9  cm. Normal morphology without mass. Internal blood flow present on color Doppler imaging. Small partially collapsed follicle cysts noted.  Left ovary  Measurements: 4.2 x 2.1 x 2.4 cm. Normal morphology with a dominant follicle cyst 3.4 x 1.9 x 1.6 cm  Other findings  No free pelvic fluid or additional adnexal masses.  IMPRESSION Small left ovarian follicle cyst 3.4 cm greatest size.  Upper normal thickness of the endometrial complex at 17 mm thick questionably containing a tiny amount of fluid.  Otherwise negative exam.  SIGNATURE  Electronically Signed   By: Ulyses SouthwardMark  Boles M.D.   On: 08/18/2013 18:20      Antony MaduraKelly Trula Frede, PA-C 08/22/13 [id number]

## 2013-08-22 NOTE — ED Provider Notes (Signed)
0500 - Received a call from pharmacy regarding patient's prescription. Pharmacist states that the patient had 90 tablets of 10-325 mg hydrocodone tablets filled on 08/13/2013. Patient was also given prescription on 08/18/2013 for 20 tablets of Percocet. Patient looked up uncontrolled substance data base. Database revealed that patient did in fact have prescription of hydrocodone filled on this date; however, there is no evidence of patient having her Percocet prescription from 08/18/2013 filled. Patient claiming that she filled this prescription. Advised pharmacist not to fill prescription given to patient today. She will need to use pain medication prescribed to her on 08/13/13 or have Percocet Rx from 08/18/13 filled for additional pain control should she feel her current supply is insufficient.  Patient called hospital almost immediately after plan relayed to pharmacist. Patient yelling at nurse and demanding to speak with provider regarding why her prescription was declined. Patient making manipulative statements. Have relayed to nurse the reasons for denying her Rx today. Strongly suspect drug seeking behavior in this patient.  Antony MaduraKelly Esau Fridman, [place] 08/22/13 [phone]

## 2013-08-22 NOTE — ED Provider Notes (Signed)
CSN: [id number]     Arrival date & time 08/21/13  2244 History   First MD Initiated Contact with Patient 08/22/13 0003     Chief Complaint  Patient presents with  . Back Pain  . Abdominal Pain     (Consider location/radiation/quality/duration/timing/severity/associated sxs/prior Treatment) The history is provided by the patient and medical records. No language interpreter was used.    [name] is a 28 y.o. female  with a hx of kidney stones, depression presents to the Emergency Department complaining of gradual, persistent, progressively worsening flank pain with associated urinary hesitancy onset 8 days ago.  Pt was seen in the ED on 08/18/13 for the same and reports symptoms have not gotten better.  Pt reports the pain is in the right flank and right upper quadrant abd pain which is new since the last visit.  Pt reports fever to 103.2 at home this AM.  She took ibuprofen 800mg  and then tylenol which relieved the fever.  Associated symptoms include nausea and vomiting.  Emesis in NBNB and she has not had diarrhea.  Pt reports she is taking doxycycline and keflex as directed.  Pt reports taking percocet and Norco together for pain at home without relief.  Nothing specifically makes the symptoms better or worse. Pt denies headache, neck pain, chest pain, SOB, weakness, dizziness, syncope, rash.  Pt denies dysuria reporting only the hesitancy.      Past Medical History  Diagnosis Date  . Depression   . Bipolar 1 disorder, depressed   . PTSD (post-traumatic stress disorder)   . Kidney stone   . Migraine   . Urinary tract infection   . Pregnancy induced hypertension    Past Surgical History  Procedure Laterality Date  . Kidney stones    . Lipotripsy     Family History  Problem Relation Age of Onset  . Rheum arthritis Mother   . Asthma Mother   . Hypertension Mother   . Other Neg Hx    History  Substance Use Topics  . Smoking status: Current Every Day Smoker -- 1.00  packs/day    Types: Cigarettes  . Smokeless tobacco: Never Used  . Alcohol Use: No   OB History   Grav Para Term Preterm Abortions TAB SAB Ect Mult Living   3 2 2  1  1   2      Review of Systems  Constitutional: Positive for fever. Negative for diaphoresis, appetite change, fatigue and unexpected weight change.  HENT: Negative for mouth sores.   Eyes: Negative for visual disturbance.  Respiratory: Negative for cough, chest tightness, shortness of breath and wheezing.   Cardiovascular: Negative for chest pain.  Gastrointestinal: Positive for nausea, vomiting and abdominal pain (RUQ abd pain). Negative for diarrhea and constipation.  Endocrine: Negative for polydipsia, polyphagia and polyuria.  Genitourinary: Positive for hematuria and flank pain. Negative for dysuria, urgency and frequency.  Musculoskeletal: Negative for back pain and neck stiffness.  Skin: Negative for rash.  Allergic/Immunologic: Negative for immunocompromised state.  Neurological: Negative for syncope, light-headedness and headaches.  Hematological: Does not bruise/bleed easily.  Psychiatric/Behavioral: Negative for sleep disturbance. The patient is not nervous/anxious.       Allergies  Darvocet; Morphine and related; Sulfa antibiotics; and Tomato  Home Medications   Current Outpatient Rx  Name  Route  Sig  Dispense  Refill  . cephALEXin (KEFLEX) 500 MG capsule   Oral   Take 1 capsule (500 mg total) by mouth 4 (four) times  daily.   30 capsule   0   . clonazePAM (KLONOPIN) 1 MG tablet   Oral   Take 1 mg by mouth 2 (two) times daily.         Marland Kitchen doxycycline (VIBRAMYCIN) 50 MG capsule   Oral   Take 2 capsules (100 mg total) by mouth 2 (two) times daily.   28 capsule   0   . ibuprofen (ADVIL,MOTRIN) 200 MG tablet   Oral   Take 600 mg by mouth every 6 (six) hours as needed for pain or headache.         . Lurasidone HCl (LATUDA) 20 MG TABS   Oral   Take 1 tablet by mouth every morning.           . ondansetron (ZOFRAN) 4 MG tablet   Oral   Take 1 tablet (4 mg total) by mouth every 8 (eight) hours as needed for nausea or vomiting.   20 tablet   0   . oxyCODONE-acetaminophen (ROXICET) 5-325 MG per tablet   Oral   Take 1-2 tablets by mouth every 6 (six) hours as needed for severe pain.   20 tablet   0   . pregabalin (LYRICA) 75 MG capsule   Oral   Take 75 mg by mouth 2 (two) times daily.         . Prenatal Vit-Fe Fumarate-FA (PRENATAL MULTIVITAMIN) TABS tablet   Oral   Take 1 tablet by mouth every morning.           BP 143/108  Pulse 87  Temp(Src) 97.4 F (36.3 C) (Oral)  Resp 22  Ht 5\' 7"  (1.702 m)  Wt 361 lb (163.749 kg)  BMI 56.53 kg/m2  SpO2 99%  LMP 08/01/2013 Physical Exam  Nursing note and vitals reviewed. Constitutional: She is oriented to person, place, and time. She appears well-developed and well-nourished. No distress.  Awake, alert, nontoxic appearance  HENT:  Head: Normocephalic and atraumatic.  Mouth/Throat: Oropharynx is clear and moist. No oropharyngeal exudate.  Eyes: Conjunctivae are normal. No scleral icterus.  Neck: Normal range of motion. Neck supple.  Cardiovascular: Regular rhythm, normal heart sounds and intact distal pulses.   No tachycardia  Pulmonary/Chest: Effort normal and breath sounds normal. No respiratory distress. She has no wheezes.  Clear equal breath sounds  Abdominal: Soft. Bowel sounds are normal. She exhibits no distension and no mass. There is tenderness. There is guarding, CVA tenderness and positive Murphy's sign. There is no rebound.  Obese Abdomen soft with right upper quadrant tenderness and guarding Right CVA tenderness, no left CVA tenderness  Musculoskeletal: Normal range of motion. She exhibits no edema.  Lymphadenopathy:    She has no cervical adenopathy.  Neurological: She is alert and oriented to person, place, and time. She exhibits normal muscle tone. Coordination normal.  Speech is clear and  goal oriented Moves extremities without ataxia  Skin: Skin is warm and dry. She is not diaphoretic. No erythema.  Psychiatric:  Patient tearful    ED Course  Procedures (including critical care time) Labs Review Labs Reviewed  URINALYSIS, ROUTINE W REFLEX MICROSCOPIC - Abnormal; Notable for the following:    Glucose, UA >1000 (*)    Hgb urine dipstick LARGE (*)    All other components within normal limits  URINE MICROSCOPIC-ADD ON  CBC  COMPREHENSIVE METABOLIC PANEL  LIPASE, BLOOD  POC URINE PREG, ED   Imaging Review No results found.   EKG Interpretation None  MDM   Final diagnoses:  Right flank pain  RUQ abdominal pain    RIELY OETKEN presents with right flank pain now radiating into her right upper quadrant.  Patient reports fever and emesis at home. Record review shows that she was seen here on 08/18/2013. At that time she had a normal CT scan without nephrolithiasis and a normal pelvic ultrasound without evidence of PID. Patient was discharged home and treated for UTI and PID. She reports she is taking his medications but her symptoms have not resolved.  We'll recheck basic blood work, UA and obtain ultrasound of right upper quadrant.  Pt given fluids and pain control.    1:32 AM Labs and [place] pending.  Discussed with Antony Madura, PA-C who will follow and dispo.   Laura Client Kaylon Hitz, PA-C 08/22/13 [phone]

## 2013-08-22 NOTE — ED Notes (Signed)
Pt. reports persistent right abdominal and right flank pain , [facility] - lab tests done / discharged with prescription medications and follow up instructions with urologist. Denies fever or chills.

## 2013-08-23 LAB — HEPATITIS PANEL, ACUTE
HCV Ab: REACTIVE — AB
Hep A IgM: NONREACTIVE
Hep B C IgM: NONREACTIVE
Hepatitis B Surface Ag: NEGATIVE

## 2013-08-23 MED ORDER — METFORMIN HCL 500 MG PO TABS: 500.0000 mg | ORAL_TABLET | Freq: Two times a day (BID) | ORAL | Status: DC

## 2013-08-23 NOTE — ED Provider Notes (Signed)
Medical screening examination/treatment/procedure(s) were performed by non-physician practitioner and as supervising physician I was immediately available for consultation/collaboration.   EKG Interpretation None       Olivia Mackielga M Fredrica Capano, MD 08/23/13 [phone]

## 2013-08-23 NOTE — Discharge Instructions (Signed)
Diabetes and Exercise Exercising regularly is important. It is not just about losing weight. It has many health benefits, such as:  Improving your overall fitness, flexibility, and endurance.  Increasing your bone density.  Helping with weight control.  Decreasing your body fat.  Increasing your muscle strength.  Reducing stress and tension.  Improving your overall health. People with diabetes who exercise gain additional benefits because exercise:  Reduces appetite.  Improves the body's use of blood sugar (glucose).  Helps lower or control blood glucose.  Decreases blood pressure.  Helps control blood lipids (such as cholesterol and triglycerides).  Improves the body's use of the hormone insulin by:  Increasing the body's insulin sensitivity.  Reducing the body's insulin needs.  Decreases the risk for heart disease because exercising:  Lowers cholesterol and triglycerides levels.  Increases the levels of good cholesterol (such as high-density lipoproteins [HDL]) in the body.  Lowers blood glucose levels. YOUR ACTIVITY PLAN  Choose an activity that you enjoy and set realistic goals. Your health care provider or diabetes educator can help you make an activity plan that works for you. You can break activities into 2 or 3 sessions throughout the day. Doing so is as good as one long session. Exercise ideas include:  Taking the dog for a walk.  Taking the stairs instead of the elevator.  Dancing to your favorite song.  Doing your favorite exercise with a friend. RECOMMENDATIONS FOR EXERCISING WITH TYPE 1 OR TYPE 2 DIABETES   Check your blood glucose before exercising. If blood glucose levels are greater than 240 mg/dL, check for urine ketones. Do not exercise if ketones are present.  Avoid injecting insulin into areas of the body that are going to be exercised. For example, avoid injecting insulin into:  The arms when playing tennis.  The legs when  jogging.  Keep a record of:  Food intake before and after you exercise.  Expected peak times of insulin action.  Blood glucose levels before and after you exercise.  The type and amount of exercise you have done.  Review your records with your health care provider. Your health care provider will help you to develop guidelines for adjusting food intake and insulin amounts before and after exercising.  If you take insulin or oral hypoglycemic agents, watch for signs and symptoms of hypoglycemia. They include:  Dizziness.  Shaking.  Sweating.  Chills.  Confusion.  Drink plenty of water while you exercise to prevent dehydration or heat stroke. Body water is lost during exercise and must be replaced.  Talk to your health care provider before starting an exercise program to make sure it is safe for you. Remember, almost any type of activity is better than none. Document Released: 08/18/2003 Document Revised: 01/28/2013 Document Reviewed: 11/04/2012 [facility] Patient Information 2014 [place].  Diabetes and Standards of Medical Care  Diabetes is complicated. You may find that your diabetes team includes a dietitian, nurse, diabetes educator, eye doctor, and more. To help everyone know what is going on and to help you get the care you deserve, the following schedule of care was developed to help keep you on track. Below are the tests, exams, vaccines, medicines, education, and plans you will need. HbA1c test This test shows how well you have controlled your glucose over the past 2 3 months. It is used to see if your diabetes management plan needs to be adjusted.   It is performed at least 2 times a year if you are meeting treatment  goals.  It is performed 4 times a year if therapy has changed or if you are not meeting treatment goals. Blood pressure test  This test is performed at every routine medical visit. The goal is less than 140/90 mmHg for most people, but 130/80 mmHg  in some cases. Ask your health care provider about your goal. Dental exam  Follow up with the dentist regularly. Eye exam  If you are diagnosed with type 1 diabetes as a child, get an exam upon reaching the age of 25 years or older and have had diabetes for 3 5 years. Yearly eye exams are recommended after that initial eye exam.  If you are diagnosed with type 1 diabetes as an adult, get an exam within 5 years of diagnosis and then yearly.  If you are diagnosed with type 2 diabetes, get an exam as soon as possible after the diagnosis and then yearly. Foot care exam  Visual foot exams are performed at every routine medical visit. The exams check for cuts, injuries, or other problems with the feet.  A comprehensive foot exam should be done yearly. This includes visual inspection as well as assessing foot pulses and testing for loss of sensation.  Check your feet nightly for cuts, injuries, or other problems with your feet. Tell your health care provider if anything is not healing. Kidney function test (urine microalbumin)  This test is performed once a year.  Type 1 diabetes: The first test is performed 5 years after diagnosis.  Type 2 diabetes: The first test is performed at the time of diagnosis.  A serum creatinine and estimated glomerular filtration rate (eGFR) test is done once a year to assess the level of chronic kidney disease (CKD), if present. Lipid profile (cholesterol, HDL, LDL, triglycerides)  Performed every 5 years for most people.  The goal for LDL is less than 100 mg/dL. If you are at high risk, the goal is less than 70 mg/dL.  The goal for HDL is 40 mg/dL 50 mg/dL for men and 50 mg/dL 60 mg/dL for women. An HDL cholesterol of 60 mg/dL or higher gives some protection against heart disease.  The goal for triglycerides is less than 150 mg/dL. Influenza vaccine, pneumococcal vaccine, and hepatitis B vaccine  The influenza vaccine is recommended yearly.  The  pneumococcal vaccine is generally given once in a lifetime. However, there are some instances when another vaccination is recommended. Check with your health care provider.  The hepatitis B vaccine is also recommended for adults with diabetes. Diabetes self-management education  Education is recommended at diagnosis and ongoing as needed. Treatment plan  Your treatment plan is reviewed at every medical visit. Document Released: 03/25/2009 Document Revised: 01/28/2013 Document Reviewed: 10/28/2012 [facility] Patient Information 2014 [place].  Hyperglycemia Hyperglycemia occurs when the glucose (sugar) in your blood is too high. Hyperglycemia can happen for many reasons, but it most often happens to people who do not know they have diabetes or are not managing their diabetes properly.  CAUSES  Whether you have diabetes or not, there are other causes of hyperglycemia. Hyperglycemia can occur when you have diabetes, but it can also occur in other situations that you might not be as aware of, such as: Diabetes  If you have diabetes and are having problems controlling your blood glucose, hyperglycemia could occur because of some of the following reasons:  Not following your meal plan.  Not taking your diabetes medications or not taking it properly.  Exercising less or  doing less activity than you normally do.  Being sick. Pre-diabetes  This cannot be ignored. Before people develop Type 2 diabetes, they almost always have "pre-diabetes." This is when your blood glucose levels are higher than normal, but not yet high enough to be diagnosed as diabetes. Research has shown that some long-term damage to the body, especially the heart and circulatory system, may already be occurring during pre-diabetes. If you take action to manage your blood glucose when you have pre-diabetes, you may delay or prevent Type 2 diabetes from developing. Stress  If you have diabetes, you may be "diet"  controlled or on oral medications or insulin to control your diabetes. However, you may find that your blood glucose is higher than usual in the hospital whether you have diabetes or not. This is often referred to as "stress hyperglycemia." Stress can elevate your blood glucose. This happens because of hormones put out by the body during times of stress. If stress has been the cause of your high blood glucose, it can be followed regularly by your caregiver. That way he/she can make sure your hyperglycemia does not continue to get worse or progress to diabetes. Steroids  Steroids are medications that act on the infection fighting system (immune system) to block inflammation or infection. One side effect can be a rise in blood glucose. Most people can produce enough extra insulin to allow for this rise, but for those who cannot, steroids make blood glucose levels go even higher. It is not unusual for steroid treatments to "uncover" diabetes that is developing. It is not always possible to determine if the hyperglycemia will go away after the steroids are stopped. A special blood test called an A1c is sometimes done to determine if your blood glucose was elevated before the steroids were started. SYMPTOMS  Thirsty.  Frequent urination.  Dry mouth.  Blurred vision.  Tired or fatigue.  Weakness.  Sleepy.  Tingling in feet or leg. DIAGNOSIS  Diagnosis is made by monitoring blood glucose in one or all of the following ways:  A1c test. This is a chemical found in your blood.  Fingerstick blood glucose monitoring.  Laboratory results. TREATMENT  First, knowing the cause of the hyperglycemia is important before the hyperglycemia can be treated. Treatment may include, but is not be limited to:  Education.  Change or adjustment in medications.  Change or adjustment in meal plan.  Treatment for an illness, infection, etc.  More frequent blood glucose monitoring.  Change in exercise  plan.  Decreasing or stopping steroids.  Lifestyle changes. HOME CARE INSTRUCTIONS   Test your blood glucose as directed.  Exercise regularly. Your caregiver will give you instructions about exercise. Pre-diabetes or diabetes which comes on with stress is helped by exercising.  Eat wholesome, balanced meals. Eat often and at regular, fixed times. Your caregiver or nutritionist will give you a meal plan to guide your sugar intake.  Being at an ideal weight is important. If needed, losing as little as 10 to 15 pounds may help improve blood glucose levels. SEEK MEDICAL CARE IF:   You have questions about medicine, activity, or diet.  You continue to have symptoms (problems such as increased thirst, urination, or weight gain). SEEK IMMEDIATE MEDICAL CARE IF:   You are vomiting or have diarrhea.  Your breath smells fruity.  You are breathing faster or slower.  You are very sleepy or incoherent.  You have numbness, tingling, or pain in your feet or hands.  You have  chest pain.  Your symptoms get worse even though you have been following your caregiver's orders.  If you have any other questions or concerns. Document Released: 11/21/2000 Document Revised: 08/20/2011 Document Reviewed: 09/24/2011 [facility] Patient Information 2014 [place], [place].

## 2013-08-23 NOTE — ED Provider Notes (Signed)
Medical screening examination/treatment/procedure(s) were performed by non-physician practitioner and as supervising physician I was immediately available for consultation/collaboration.   EKG Interpretation None     Please see my note from this visit  Olivia Mackielga M Vidhi Delellis, MD 08/23/13 [phone]

## 2013-08-23 NOTE — ED Notes (Signed)
Pt discharged.Vital signs stable and GCS 15.Pt still complaining of pain 10/10.Pt demanding for more pain medications.Known by EDP.

## 2013-08-24 ENCOUNTER — Encounter (HOSPITAL_COMMUNITY): Payer: Self-pay | Admitting: Emergency Medicine

## 2013-08-24 DIAGNOSIS — B182 Chronic viral hepatitis C: Secondary | ICD-10-CM | POA: Insufficient documentation

## 2013-08-24 DIAGNOSIS — F17200 Nicotine dependence, unspecified, uncomplicated: Secondary | ICD-10-CM | POA: Insufficient documentation

## 2013-08-24 DIAGNOSIS — Z87442 Personal history of urinary calculi: Secondary | ICD-10-CM | POA: Insufficient documentation

## 2013-08-24 DIAGNOSIS — Z792 Long term (current) use of antibiotics: Secondary | ICD-10-CM | POA: Insufficient documentation

## 2013-08-24 DIAGNOSIS — G8929 Other chronic pain: Secondary | ICD-10-CM | POA: Insufficient documentation

## 2013-08-24 DIAGNOSIS — G40909 Epilepsy, unspecified, not intractable, without status epilepticus: Secondary | ICD-10-CM | POA: Insufficient documentation

## 2013-08-24 DIAGNOSIS — Z79899 Other long term (current) drug therapy: Secondary | ICD-10-CM | POA: Insufficient documentation

## 2013-08-24 DIAGNOSIS — F3130 Bipolar disorder, current episode depressed, mild or moderate severity, unspecified: Secondary | ICD-10-CM | POA: Insufficient documentation

## 2013-08-24 DIAGNOSIS — E669 Obesity, unspecified: Secondary | ICD-10-CM | POA: Insufficient documentation

## 2013-08-24 DIAGNOSIS — Z87440 Personal history of urinary (tract) infections: Secondary | ICD-10-CM | POA: Insufficient documentation

## 2013-08-24 DIAGNOSIS — E119 Type 2 diabetes mellitus without complications: Secondary | ICD-10-CM | POA: Insufficient documentation

## 2013-08-24 DIAGNOSIS — Z3202 Encounter for pregnancy test, result negative: Secondary | ICD-10-CM | POA: Insufficient documentation

## 2013-08-24 NOTE — ED Notes (Signed)
Hepatitis reactive-Chart sent to EDP office for review.

## 2013-08-24 NOTE — ED Notes (Signed)
Patient with flulike symptoms.  Patient with nausea and vomiting all day with headache.  Patient is CAOx3.  Patient also with flank pain and blood in her urine.

## 2013-08-25 ENCOUNTER — Emergency Department (HOSPITAL_COMMUNITY)
Admission: EM | Admit: 2013-08-25 | Discharge: 2013-08-25 | Disposition: A | Discharge status: Home / Self Care | Payer: Medicaid Other | Attending: Emergency Medicine | Admitting: Emergency Medicine

## 2013-08-25 DIAGNOSIS — B1920 Unspecified viral hepatitis C without hepatic coma: Secondary | ICD-10-CM

## 2013-08-25 DIAGNOSIS — R109 Unspecified abdominal pain: Secondary | ICD-10-CM

## 2013-08-25 HISTORY — DX: Type 2 diabetes mellitus without complications: E11.9

## 2013-08-25 HISTORY — DX: Chronic viral hepatitis C: B18.2

## 2013-08-25 LAB — URINALYSIS, ROUTINE W REFLEX MICROSCOPIC
Glucose, UA: NEGATIVE mg/dL
Ketones, ur: 15 mg/dL — AB
Nitrite: NEGATIVE
PROTEIN: 30 mg/dL — AB
SPECIFIC GRAVITY, URINE: 1.038 — AB (ref 1.005–1.030)
UROBILINOGEN UA: 1 mg/dL (ref 0.0–1.0)
pH: 5.5 (ref 5.0–8.0)

## 2013-08-25 LAB — HEPATIC FUNCTION PANEL
ALBUMIN: 3 g/dL — AB (ref 3.5–5.2)
ALT: 348 U/L — ABNORMAL HIGH (ref 0–35)
AST: 226 U/L — AB (ref 0–37)
Alkaline Phosphatase: 147 U/L — ABNORMAL HIGH (ref 39–117)
Bilirubin, Direct: <0.2 mg/dL (ref 0.0–0.3)
Total Bilirubin: 0.6 mg/dL (ref 0.3–1.2)
Total Protein: 7.3 g/dL (ref 6.0–8.3)

## 2013-08-25 LAB — URINE MICROSCOPIC-ADD ON

## 2013-08-25 LAB — I-STAT CHEM 8, ED
BUN: 14 mg/dL (ref 6–23)
Calcium, Ion: 1.18 mmol/L (ref 1.12–1.23)
Chloride: 99 mEq/L (ref 96–112)
Creatinine, Ser: 1.1 mg/dL (ref 0.50–1.10)
Glucose, Bld: 199 mg/dL — ABNORMAL HIGH (ref 70–99)
HEMATOCRIT: 45 % (ref 36.0–46.0)
HEMOGLOBIN: 15.3 g/dL — AB (ref 12.0–15.0)
Potassium: 4.1 mEq/L (ref 3.7–5.3)
SODIUM: 139 meq/L (ref 137–147)
TCO2: 27 mmol/L (ref 0–100)

## 2013-08-25 LAB — CBC WITH DIFFERENTIAL/PLATELET
BASOS PCT: 0 % (ref 0–1)
Basophils Absolute: 0 10*3/uL (ref 0.0–0.1)
EOS ABS: 0.2 10*3/uL (ref 0.0–0.7)
Eosinophils Relative: 2 % (ref 0–5)
HCT: 40.5 % (ref 36.0–46.0)
Hemoglobin: 13.3 g/dL (ref 12.0–15.0)
LYMPHS ABS: 3.2 10*3/uL (ref 0.7–4.0)
Lymphocytes Relative: 44 % (ref 12–46)
MCH: 25.8 pg — AB (ref 26.0–34.0)
MCHC: 32.8 g/dL (ref 30.0–36.0)
MCV: 78.6 fL (ref 78.0–100.0)
Monocytes Absolute: 0.5 10*3/uL (ref 0.1–1.0)
Monocytes Relative: 6 % (ref 3–12)
Neutro Abs: 3.4 10*3/uL (ref 1.7–7.7)
Neutrophils Relative %: 47 % (ref 43–77)
PLATELETS: 228 10*3/uL (ref 150–400)
RBC: 5.15 MIL/uL — AB (ref 3.87–5.11)
RDW: 16.7 % — AB (ref 11.5–15.5)
WBC: 7.2 10*3/uL (ref 4.0–10.5)

## 2013-08-25 LAB — CBG MONITORING, ED: GLUCOSE-CAPILLARY: 189 mg/dL — AB (ref 70–99)

## 2013-08-25 LAB — POC URINE PREG, ED: PREG TEST UR: NEGATIVE

## 2013-08-25 MED ORDER — OXYCODONE HCL 5 MG PO TABS
5.0000 mg | ORAL_TABLET | Freq: Once | ORAL | Status: AC
  Administered 2013-08-25: 5 mg via ORAL
  Filled 2013-08-25: qty 1

## 2013-08-25 MED ORDER — KETOROLAC TROMETHAMINE 30 MG/ML IJ SOLN
30.0000 mg | Freq: Once | INTRAMUSCULAR | Status: AC
  Administered 2013-08-25: 30 mg via INTRAVENOUS
  Filled 2013-08-25: qty 1

## 2013-08-25 MED ORDER — HYDROCODONE-IBUPROFEN 7.5-200 MG PO TABS: 1.0000 | ORAL_TABLET | Freq: Four times a day (QID) | ORAL | Status: DC | PRN

## 2013-08-25 NOTE — ED Provider Notes (Signed)
CSN: [id number]     Arrival date & time 08/24/13  2338 History   First MD Initiated Contact with Patient 08/25/13 0454     Chief Complaint  Patient presents with  . Flank Pain  . Headache  . Emesis     (Consider location/radiation/quality/duration/timing/severity/associated sxs/prior Treatment) HPI Comments: Patient is a 28 year old female with history of hepatitis C., migraine headaches, bipolar disorder. She presents today with complaints of pain in the right side. This is been going on for many months. She was recently diagnosed with hepatitis C in her Dr. stop prescribing her her normal pain medication. She usually takes Vicodin however this was changed to tramadol due to the concerns of Tylenol and her liver condition. She denies any fevers or chills.  Patient is a 28 y.o. female presenting with flank pain. The history is provided by the patient.  Flank Pain This is a chronic problem. The problem occurs constantly. Nothing aggravates the symptoms. Nothing relieves the symptoms.    Past Medical History  Diagnosis Date  . Depression   . Bipolar 1 disorder, depressed   . PTSD (post-traumatic stress disorder)   . Kidney stone   . Migraine   . Urinary tract infection   . Pregnancy induced hypertension   . Obesity   . UTI (lower urinary tract infection)   . Diabetes mellitus without complication   . Hep C w/o coma, chronic    Past Surgical History  Procedure Laterality Date  . Kidney stones    . Lipotripsy     Family History  Problem Relation Age of Onset  . Rheum arthritis Mother   . Asthma Mother   . Hypertension Mother   . Other Neg Hx    History  Substance Use Topics  . Smoking status: Current Every Day Smoker -- 1.00 packs/day    Types: Cigarettes  . Smokeless tobacco: Never Used  . Alcohol Use: No   OB History   Grav Para Term Preterm Abortions TAB SAB Ect Mult Living   3 2 2  1  1   2      Review of Systems  Genitourinary: Positive for flank pain.  All  other systems reviewed and are negative.      Allergies  Darvocet; Morphine and related; Sulfa antibiotics; and Tomato  Home Medications   Current Outpatient Rx  Name  Route  Sig  Dispense  Refill  . acetaminophen (TYLENOL) 500 MG tablet   Oral   Take 500 mg by mouth every 6 (six) hours as needed for moderate pain.         . cephALEXin (KEFLEX) 500 MG capsule   Oral   Take 1 capsule (500 mg total) by mouth 4 (four) times daily.   30 capsule   0   . clonazePAM (KLONOPIN) 1 MG tablet   Oral   Take 1 mg by mouth 3 (three) times daily.          Marland Kitchen doxycycline (VIBRAMYCIN) 50 MG capsule   Oral   Take 2 capsules (100 mg total) by mouth 2 (two) times daily.   28 capsule   0   . ibuprofen (ADVIL,MOTRIN) 200 MG tablet   Oral   Take 600 mg by mouth every 6 (six) hours as needed for pain or headache.         . ondansetron (ZOFRAN) 4 MG tablet   Oral   Take 1 tablet (4 mg total) by mouth every 8 (eight) hours as needed for nausea  or vomiting.   20 tablet   0   . pioglitazone (ACTOS) 15 MG tablet   Oral   Take 15 mg by mouth daily.         . Prenatal Vit-Fe Fumarate-FA (PRENATAL MULTIVITAMIN) TABS tablet   Oral   Take 1 tablet by mouth every morning.          . promethazine (PHENERGAN) 25 MG tablet   Oral   Take 1 tablet (25 mg total) by mouth every 6 (six) hours as needed for nausea or vomiting.   12 tablet   0   . traMADol (ULTRAM) 50 MG tablet   Oral   Take 50 mg by mouth every 6 (six) hours as needed for moderate pain.         Marland Kitchen. oxyCODONE-acetaminophen (PERCOCET) 7.5-325 MG per tablet   Oral   Take 1 tablet by mouth every 6 (six) hours as needed for pain.   17 tablet   0    BP 139/71  Pulse 89  Temp(Src) 98.1 F (36.7 C) (Oral)  Resp 16  SpO2 97%  LMP 08/01/2013 Physical Exam  Nursing note and vitals reviewed. Constitutional: She is oriented to person, place, and time. She appears well-developed and well-nourished. No distress.  HENT:   Head: Normocephalic and atraumatic.  Neck: Normal range of motion. Neck supple.  Cardiovascular: Normal rate and regular rhythm.  Exam reveals no gallop and no friction rub.   No murmur heard. Pulmonary/Chest: Effort normal and breath sounds normal. No respiratory distress. She has no wheezes.  Abdominal: Soft. Bowel sounds are normal. She exhibits no distension. There is no tenderness.  Abdomen is obese. There is tenderness to palpation in the right upper lower quadrant. There is no rebound and no guarding.  Musculoskeletal: Normal range of motion.  Neurological: She is alert and oriented to person, place, and time.  Skin: Skin is warm and dry. She is not diaphoretic.    ED Course  Procedures (including critical care time) Labs Review Labs Reviewed  CBC WITH DIFFERENTIAL - Abnormal; Notable for the following:    RBC 5.15 (*)    MCH 25.8 (*)    RDW 16.7 (*)    All other components within normal limits  URINALYSIS, ROUTINE W REFLEX MICROSCOPIC - Abnormal; Notable for the following:    Color, Urine AMBER (*)    APPearance CLOUDY (*)    Specific Gravity, Urine 1.038 (*)    Hgb urine dipstick LARGE (*)    Bilirubin Urine SMALL (*)    Ketones, ur 15 (*)    Protein, ur 30 (*)    Leukocytes, UA TRACE (*)    All other components within normal limits  URINE MICROSCOPIC-ADD ON - Abnormal; Notable for the following:    Squamous Epithelial / LPF MANY (*)    Bacteria, UA FEW (*)    Crystals CA OXALATE CRYSTALS (*)    All other components within normal limits  HEPATIC FUNCTION PANEL - Abnormal; Notable for the following:    Albumin 3.0 (*)    ALT 348 (*)    Alkaline Phosphatase 147 (*)    All other components within normal limits  I-STAT CHEM 8, ED - Abnormal; Notable for the following:    Glucose, Bld 199 (*)    Hemoglobin 15.3 (*)    All other components within normal limits  CBG MONITORING, ED - Abnormal; Notable for the following:    Glucose-Capillary 189 (*)    All other  components  within normal limits  POC URINE PREG, ED   Imaging Review No results found.   EKG Interpretation None      MDM   Final diagnoses:  None    Patient presents with an exacerbation of her chronic pain since her doctor stopped prescribing her Vicodin to do her liver concerns. She has been here now 4 times in 1 week with similar complaints. Upon reading the previous providers note, there was some confusion about her filling a prescription for Percocet shortly after filling a prescription for 90 Vicodin. I am concerned there is drug-seeking behavior, however as I am unfamiliar with this patient I have informed her I will give her the benefit of the doubt and write her for 5 Vicoprofen. She is to call her primary care Dr. to discuss her chronic pain. Nothing today appears emergent. She has no white count and exam and history are not consistent with appendicitis. There is no evidence for UTI.    Geoffery Lyons, MD 08/25/13 [phone]

## 2013-08-25 NOTE — Discharge Instructions (Signed)
Vicoprofen as prescribed as needed for pain.   Follow up with your primary doctor for future prescriptions for pain medications. Is   Abdominal Pain, Adult Many things can cause abdominal pain. Usually, abdominal pain is not caused by a disease and will improve without treatment. It can often be observed and treated at home. Your health care provider will do a physical exam and possibly order blood tests and X-rays to help determine the seriousness of your pain. However, in many cases, more time must pass before a clear cause of the pain can be found. Before that point, your health care provider may not know if you need more testing or further treatment. HOME CARE INSTRUCTIONS  Monitor your abdominal pain for any changes. The following actions may help to alleviate any discomfort you are experiencing:  Only take over-the-counter or prescription medicines as directed by your health care provider.  Do not take laxatives unless directed to do so by your health care provider.  Try a clear liquid diet (broth, tea, or water) as directed by your health care provider. Slowly move to a bland diet as tolerated. SEEK MEDICAL CARE IF:  You have unexplained abdominal pain.  You have abdominal pain associated with nausea or diarrhea.  You have pain when you urinate or have a bowel movement.  You experience abdominal pain that wakes you in the night.  You have abdominal pain that is worsened or improved by eating food.  You have abdominal pain that is worsened with eating fatty foods. SEEK IMMEDIATE MEDICAL CARE IF:   Your pain does not go away within 2 hours.  You have a fever.  You keep throwing up (vomiting).  Your pain is felt only in portions of the abdomen, such as the right side or the left lower portion of the abdomen.  You pass bloody or black tarry stools. MAKE SURE YOU:  Understand these instructions.   Will watch your condition.   Will get help right away if you are not  doing well or get worse.  Document Released: 03/07/2005 Document Revised: 03/18/2013 Document Reviewed: 02/04/2013 [facility] Patient Information 2014 [place], [place].

## 2013-08-31 ENCOUNTER — Encounter (HOSPITAL_COMMUNITY): Payer: Self-pay | Admitting: Emergency Medicine

## 2013-08-31 ENCOUNTER — Emergency Department (HOSPITAL_COMMUNITY)
Admission: EM | Admit: 2013-08-31 | Discharge: 2013-09-01 | Disposition: A | Discharge status: Home / Self Care | Payer: Medicaid Other | Attending: Emergency Medicine | Admitting: Emergency Medicine

## 2013-08-31 ENCOUNTER — Emergency Department (HOSPITAL_COMMUNITY): Payer: Medicaid Other

## 2013-08-31 DIAGNOSIS — R109 Unspecified abdominal pain: Secondary | ICD-10-CM | POA: Insufficient documentation

## 2013-08-31 DIAGNOSIS — E119 Type 2 diabetes mellitus without complications: Secondary | ICD-10-CM | POA: Insufficient documentation

## 2013-08-31 DIAGNOSIS — F3130 Bipolar disorder, current episode depressed, mild or moderate severity, unspecified: Secondary | ICD-10-CM | POA: Insufficient documentation

## 2013-08-31 DIAGNOSIS — G43909 Migraine, unspecified, not intractable, without status migrainosus: Secondary | ICD-10-CM | POA: Insufficient documentation

## 2013-08-31 DIAGNOSIS — Z792 Long term (current) use of antibiotics: Secondary | ICD-10-CM | POA: Insufficient documentation

## 2013-08-31 DIAGNOSIS — R519 Headache, unspecified: Secondary | ICD-10-CM

## 2013-08-31 DIAGNOSIS — Z8619 Personal history of other infectious and parasitic diseases: Secondary | ICD-10-CM | POA: Insufficient documentation

## 2013-08-31 DIAGNOSIS — Z79899 Other long term (current) drug therapy: Secondary | ICD-10-CM | POA: Insufficient documentation

## 2013-08-31 DIAGNOSIS — R209 Unspecified disturbances of skin sensation: Secondary | ICD-10-CM | POA: Insufficient documentation

## 2013-08-31 DIAGNOSIS — Z87440 Personal history of urinary (tract) infections: Secondary | ICD-10-CM | POA: Insufficient documentation

## 2013-08-31 DIAGNOSIS — F4310 Post-traumatic stress disorder, unspecified: Secondary | ICD-10-CM | POA: Insufficient documentation

## 2013-08-31 DIAGNOSIS — Z3202 Encounter for pregnancy test, result negative: Secondary | ICD-10-CM | POA: Insufficient documentation

## 2013-08-31 DIAGNOSIS — R51 Headache: Secondary | ICD-10-CM

## 2013-08-31 DIAGNOSIS — F17200 Nicotine dependence, unspecified, uncomplicated: Secondary | ICD-10-CM | POA: Insufficient documentation

## 2013-08-31 DIAGNOSIS — Z87442 Personal history of urinary calculi: Secondary | ICD-10-CM | POA: Insufficient documentation

## 2013-08-31 DIAGNOSIS — R079 Chest pain, unspecified: Secondary | ICD-10-CM | POA: Insufficient documentation

## 2013-08-31 LAB — COMPREHENSIVE METABOLIC PANEL
ALBUMIN: 3.2 g/dL — AB (ref 3.5–5.2)
ALT: 47 U/L — AB (ref 0–35)
AST: 29 U/L (ref 0–37)
Alkaline Phosphatase: 94 U/L (ref 39–117)
BUN: 14 mg/dL (ref 6–23)
CO2: 24 meq/L (ref 19–32)
Calcium: 10.1 mg/dL (ref 8.4–10.5)
Chloride: 96 mEq/L (ref 96–112)
Creatinine, Ser: 0.62 mg/dL (ref 0.50–1.10)
GFR calc Af Amer: >90 mL/min (ref >90)
Glucose, Bld: 160 mg/dL — ABNORMAL HIGH (ref 70–99)
Potassium: 4 mEq/L (ref 3.7–5.3)
SODIUM: 137 meq/L (ref 137–147)
Total Bilirubin: 0.5 mg/dL (ref 0.3–1.2)
Total Protein: 7.6 g/dL (ref 6.0–8.3)

## 2013-08-31 LAB — CBC WITH DIFFERENTIAL/PLATELET
BASOS PCT: 0 % (ref 0–1)
Basophils Absolute: 0 10*3/uL (ref 0.0–0.1)
Eosinophils Absolute: 0.1 10*3/uL (ref 0.0–0.7)
Eosinophils Relative: 1 % (ref 0–5)
HEMATOCRIT: 40 % (ref 36.0–46.0)
Hemoglobin: 13.1 g/dL (ref 12.0–15.0)
LYMPHS ABS: 3.8 10*3/uL (ref 0.7–4.0)
LYMPHS PCT: 41 % (ref 12–46)
MCH: 25.5 pg — ABNORMAL LOW (ref 26.0–34.0)
MCHC: 32.8 g/dL (ref 30.0–36.0)
MCV: 77.8 fL — ABNORMAL LOW (ref 78.0–100.0)
MONO ABS: 0.4 10*3/uL (ref 0.1–1.0)
MONOS PCT: 4 % (ref 3–12)
Neutro Abs: 5.1 10*3/uL (ref 1.7–7.7)
Neutrophils Relative %: 54 % (ref 43–77)
Platelets: 246 10*3/uL (ref 150–400)
RBC: 5.14 MIL/uL — AB (ref 3.87–5.11)
RDW: 15.6 % — ABNORMAL HIGH (ref 11.5–15.5)
WBC: 9.4 10*3/uL (ref 4.0–10.5)

## 2013-08-31 LAB — LIPASE, BLOOD: Lipase: 35 U/L (ref 11–59)

## 2013-08-31 LAB — POC URINE PREG, ED: PREG TEST UR: NEGATIVE

## 2013-08-31 LAB — CBG MONITORING, ED: Glucose-Capillary: 141 mg/dL — ABNORMAL HIGH (ref 70–99)

## 2013-08-31 LAB — TROPONIN I

## 2013-08-31 MED ORDER — DIPHENHYDRAMINE HCL 50 MG/ML IJ SOLN
12.5000 mg | Freq: Once | INTRAMUSCULAR | Status: AC
  Administered 2013-08-31: 12.5 mg via INTRAVENOUS
  Filled 2013-08-31: qty 1

## 2013-08-31 MED ORDER — SODIUM CHLORIDE 0.9 % IV BOLUS (SEPSIS)
1000.0000 mL | Freq: Once | INTRAVENOUS | Status: AC
  Administered 2013-08-31: 1000 mL via INTRAVENOUS

## 2013-08-31 MED ORDER — KETOROLAC TROMETHAMINE 30 MG/ML IJ SOLN
30.0000 mg | Freq: Once | INTRAMUSCULAR | Status: AC
  Administered 2013-08-31: 30 mg via INTRAVENOUS
  Filled 2013-08-31: qty 1

## 2013-08-31 MED ORDER — METOCLOPRAMIDE HCL 5 MG/ML IJ SOLN
10.0000 mg | Freq: Once | INTRAMUSCULAR | Status: AC
  Administered 2013-08-31: 10 mg via INTRAVENOUS
  Filled 2013-08-31: qty 2

## 2013-08-31 NOTE — ED Notes (Signed)
Pt states she has had a headache all day and this morning around 10am she started having pain in her right chest area  Pt states the pain goes into her back and feels like a burning  Pt states she has sharp pains that come and go on the right side of her neck  Pt states she has been having dizziness today

## 2013-08-31 NOTE — ED Provider Notes (Signed)
CSN: [id number]     Arrival date & time 08/31/13  2027 History   First MD Initiated Contact with Patient 08/31/13 2141     Chief Complaint  Patient presents with  . Chest Pain  . Migraine     (Consider location/radiation/quality/duration/timing/severity/associated sxs/prior Treatment) HPI 28 yo female presents with HA similar to previous migraines in past that started at 2am. HA described as sharp right sided HA that is constant, with associated auras, photophobia, phonophobia, and N/V. Patient also states she has been having intermittent chest pain Chest pain that started at 8 am this morning. Pain is rated at 10/10 sharp stabbing pain without radiation. Patient states she is also experiencing some Tingling and burning on her back. Patient denies any rash, fever/chills, shortness of breath.  CAD Risk Factors: HTN: Yes Hyperlipidemia: No Cigarette smoking: Yes Diabetes Mellitus: Yes Family hx of CAD or MI < 42 yo : No Cocaine Use: No  Heart Score: ( a score 0-3 are low risk with less than 2% risk of MACE at 6 weeks.) Suspicion: 0 Suspicious +2 Moderately suspicious  +1  Slightly suspicious  +0  EKG: 0 Significant ST-depression  +2 Non-specific repolarisation disturbance  +1  Normal  +0 AGE:82 > 65   +2  45-65  +1  < 45  +0  Risk Factors:2 (Hypercholesterolemia,HTN, DM, Cigarette smoking, Positive Family hx, Obesity) > 3 risk factors  +2  1-2 risk factors  +1 No known risk factors  +0  Troponin:0 > 3 x normal limit +2  1-3 x normal limit +1 < normal limit  +0   Total: 2   Well's Criteria: Suspect DVT : (3) No PE most likely: (3) No HR > 100: (1.5) No Immobilization > 3 days or Surgery w/in 4 wks: (1.5) No Hx of DVT/PE: (1.5) No Hemoptysis: (1) No Hx of Cancer with tx w/in 6 mo: (1) No Total: 0  PERC Criteria: Age > 44 yo: No HR > 100 bpm: No O2 sat on RA < 95%: No Prior hx of DVT/PE:No Trauma or surgery in past 4 wks:No Hemoptysis:No Exogenous  Estrogen/Testosterone use:No Unilateral Leg swelling: No     Past Medical History  Diagnosis Date  . Depression   . Bipolar 1 disorder, depressed   . PTSD (post-traumatic stress disorder)   . Kidney stone   . Migraine   . Urinary tract infection   . Pregnancy induced hypertension   . Obesity   . UTI (lower urinary tract infection)   . Diabetes mellitus without complication   . Hep C w/o coma, chronic    Past Surgical History  Procedure Laterality Date  . Kidney stones    . Lipotripsy     Family History  Problem Relation Age of Onset  . Rheum arthritis Mother   . Asthma Mother   . Hypertension Mother   . Other Neg Hx   . CAD Other    History  Substance Use Topics  . Smoking status: Current Every Day Smoker -- 1.00 packs/day    Types: Cigarettes  . Smokeless tobacco: Never Used  . Alcohol Use: No   OB History   Grav Para Term Preterm Abortions TAB SAB Ect Mult Living   3 2 2  1  1   2      Review of Systems  Gastrointestinal: Positive for nausea, vomiting and abdominal pain.  All other systems reviewed and are negative.      Allergies  Darvocet; Morphine and related; Sulfa antibiotics; and  Tomato  Home Medications   Current Outpatient Rx  Name  Route  Sig  Dispense  Refill  . acetaminophen (TYLENOL) 500 MG tablet   Oral   Take 500 mg by mouth every 6 (six) hours as needed for moderate pain.         . clonazePAM (KLONOPIN) 1 MG tablet   Oral   Take 1 mg by mouth 3 (three) times daily.          . ondansetron (ZOFRAN) 4 MG tablet   Oral   Take 1 tablet (4 mg total) by mouth every 8 (eight) hours as needed for nausea or vomiting.   20 tablet   0   . pioglitazone (ACTOS) 15 MG tablet   Oral   Take 15 mg by mouth daily.         . Prenatal Vit-Fe Fumarate-FA (PRENATAL MULTIVITAMIN) TABS tablet   Oral   Take 1 tablet by mouth every morning.          . promethazine (PHENERGAN) 25 MG tablet   Oral   Take 1 tablet (25 mg total) by mouth  every 6 (six) hours as needed for nausea or vomiting.   12 tablet   0   . traMADol (ULTRAM) 50 MG tablet   Oral   Take 50 mg by mouth every 6 (six) hours as needed for moderate pain.         . cephALEXin (KEFLEX) 500 MG capsule   Oral   Take 1 capsule (500 mg total) by mouth 4 (four) times daily.   30 capsule   0   . doxycycline (VIBRAMYCIN) 50 MG capsule   Oral   Take 2 capsules (100 mg total) by mouth 2 (two) times daily.   28 capsule   0   . HYDROcodone-ibuprofen (VICOPROFEN) 7.5-200 MG per tablet   Oral   Take 1 tablet by mouth every 6 (six) hours as needed for moderate pain.   5 tablet   0   . oxyCODONE-acetaminophen (PERCOCET) 7.5-325 MG per tablet   Oral   Take 1 tablet by mouth every 6 (six) hours as needed for pain.   17 tablet   0    BP 117/62  Pulse 77  Temp(Src) 98.2 F (36.8 C) (Oral)  Resp 20  SpO2 98%  LMP 08/23/2013 Physical Exam  Nursing note and vitals reviewed. Constitutional: She is oriented to person, place, and time. She appears well-developed and well-nourished. No distress.  Patient lying in dark room with sunglasses on.   Patient morbidly obese   HENT:  Head: Normocephalic and atraumatic.  Right Ear: Tympanic membrane and ear canal normal.  Left Ear: Tympanic membrane and ear canal normal.  Nose: Nose normal. Right sinus exhibits no maxillary sinus tenderness and no frontal sinus tenderness. Left sinus exhibits no maxillary sinus tenderness and no frontal sinus tenderness.  Mouth/Throat: Uvula is midline, oropharynx is clear and moist and mucous membranes are normal. No oropharyngeal exudate, posterior oropharyngeal edema or posterior oropharyngeal erythema.  Eyes: Conjunctivae and EOM are normal. Pupils are equal, round, and reactive to light. Right eye exhibits no discharge. Left eye exhibits no discharge. No scleral icterus.  Neck: Trachea normal, normal range of motion and phonation normal. Neck supple. No JVD present. Carotid bruit  is not present. No rigidity. No tracheal deviation, no edema and no erythema present.  Cardiovascular: Normal rate and regular rhythm.  Exam reveals no gallop and no friction rub.   No murmur heard.  Pulmonary/Chest: Effort normal and breath sounds normal. No stridor. No respiratory distress. She has no wheezes. She has no rhonchi. She has no rales.  Abdominal: Soft. Bowel sounds are normal. She exhibits no distension. There is no hepatosplenomegaly. There is no tenderness. There is no rigidity, no rebound, no guarding, no tenderness at McBurney's point and negative Murphy's sign.  Musculoskeletal: Normal range of motion. She exhibits no edema.  No midline spinal tenderness.    Lymphadenopathy:    She has no cervical adenopathy.  Neurological: She is alert and oriented to person, place, and time. She has normal strength. No cranial nerve deficit or sensory deficit. She displays a negative Romberg sign.  CN II-XII grossly intact. Cerebellar function appears intact with finger to nose exam. Patient ambulates in room without assistance.    Skin: Skin is warm and dry. No rash noted. She is not diaphoretic.  Psychiatric: She has a normal mood and affect. Her behavior is normal.    ED Course  Procedures (including critical care time) Labs Review Labs Reviewed  COMPREHENSIVE METABOLIC PANEL - Abnormal; Notable for the following:    Glucose, Bld 160 (*)    Albumin 3.2 (*)    ALT 47 (*)    All other components within normal limits  URINALYSIS, ROUTINE W REFLEX MICROSCOPIC - Abnormal; Notable for the following:    Color, Urine RED (*)    APPearance TURBID (*)    Hgb urine dipstick LARGE (*)    Protein, ur 100 (*)    Leukocytes, UA SMALL (*)    All other components within normal limits  CBC WITH DIFFERENTIAL - Abnormal; Notable for the following:    RBC 5.14 (*)    MCV 77.8 (*)    MCH 25.5 (*)    RDW 15.6 (*)    All other components within normal limits  URINE MICROSCOPIC-ADD ON -  Abnormal; Notable for the following:    Squamous Epithelial / LPF FEW (*)    Bacteria, UA FEW (*)    All other components within normal limits  CBG MONITORING, ED - Abnormal; Notable for the following:    Glucose-Capillary 141 (*)    All other components within normal limits  LIPASE, BLOOD  TROPONIN I  I-STAT TROPOININ, ED  POC URINE PREG, ED  Rosezena Sensor, ED   Imaging Review Dg Chest 2 View  08/31/2013   CLINICAL DATA:  28 year old female with chest pain.  EXAM: CHEST  2 VIEW  COMPARISON:  05/02/2009  FINDINGS: The cardiomediastinal silhouette is unremarkable.  There is no evidence of focal airspace disease, pulmonary edema, suspicious pulmonary nodule/mass, pleural effusion, or pneumothorax.  No acute bony abnormalities are identified.  IMPRESSION: No active cardiopulmonary disease.   Electronically Signed   By: Laveda Abbe M.D.   On: 08/31/2013 22:29     EKG Interpretation   Date/Time:  Monday August 31 2013 20:49:42 EDT Ventricular Rate:  98 PR Interval:  160 QRS Duration: 80 QT Interval:  347 QTC Calculation: 443 R Axis:   74 Text Interpretation:  Sinus rhythm No significant change since last  tracing Confirmed by BEATON  MD, ROBERT ([id number]) on 09/01/2013 12:02:01 AM      MDM   Final diagnoses:  HA (headache)  Chest pain   Patient afebrile with normal VS.  Urine preg negative.  UA positive for large hgb, appears to be chronic finding for patient given previous studies. No evidence of UTI.   EKG shows sinus rhythm with no significant changes.  Delta troponin negative at 13 hours post onset of chest pain.  Patient has positive risk factors for CAD, though patient low risk for ACS given heart score of 2. Doubt ACS.  Patient is low risk for PE by Well's Criteria. Patient PERC negative. PE unlikely. CXR shows no evidence of widened mediastinum, pneumoperitoneum, or pneumomediastinum. Doubt aortic dissection or esophageal rupture. no Cardiomegaly on CXR and no evidence  of low voltage on EKG. Doubt Pericardial tamponade, or CHF.   Patient unimproved after HA cocktail. Patient given subQ imitrex. Patient HA now resolved with pain rating of 0/10.  Patient states she is now ready for discharge. Plan to have patient follow up with her PCP for further management of her chronic migraines. Discussed possible initiation of prophylaxis. Patient agrees with this plan. Discharged in good condition.   Meds given in ED:  Medications  metoCLOPramide (REGLAN) injection 10 mg (10 mg Intravenous Given 08/31/13 2235)  diphenhydrAMINE (BENADRYL) injection 12.5 mg (12.5 mg Intravenous Given 08/31/13 2234)  ketorolac (TORADOL) 30 MG/ML injection 30 mg (30 mg Intravenous Given 08/31/13 2234)  sodium chloride 0.9 % bolus 1,000 mL (0 mLs Intravenous Stopped 08/31/13 2340)  SUMAtriptan (IMITREX) injection 6 mg (6 mg Subcutaneous Given 09/01/13 0032)  dexamethasone (DECADRON) injection 10 mg (10 mg Intravenous Given 09/01/13 0146)    Discharge Medication List as of 09/01/2013  1:58 AM         Rudene AndaJacob Gray Yaphet [place], PA-C 09/01/13 1302

## 2013-09-01 LAB — URINALYSIS, ROUTINE W REFLEX MICROSCOPIC
BILIRUBIN URINE: NEGATIVE
Glucose, UA: NEGATIVE mg/dL
Ketones, ur: NEGATIVE mg/dL
Nitrite: NEGATIVE
PH: 5.5 (ref 5.0–8.0)
Protein, ur: 100 mg/dL — AB
Specific Gravity, Urine: 1.025 (ref 1.005–1.030)
UROBILINOGEN UA: 0.2 mg/dL (ref 0.0–1.0)

## 2013-09-01 LAB — I-STAT TROPONIN, ED: TROPONIN I, POC: 0 ng/mL (ref 0.00–0.08)

## 2013-09-01 LAB — URINE MICROSCOPIC-ADD ON

## 2013-09-01 MED ORDER — DEXAMETHASONE SODIUM PHOSPHATE 10 MG/ML IJ SOLN
10.0000 mg | Freq: Once | INTRAMUSCULAR | Status: AC
  Administered 2013-09-01: 10 mg via INTRAVENOUS
  Filled 2013-09-01: qty 1

## 2013-09-01 MED ORDER — SUMATRIPTAN SUCCINATE 6 MG/0.5ML ~~LOC~~ SOLN
6.0000 mg | Freq: Once | SUBCUTANEOUS | Status: AC
  Administered 2013-09-01: 6 mg via SUBCUTANEOUS
  Filled 2013-09-01: qty 0.5

## 2013-09-01 NOTE — Discharge Instructions (Signed)
Follow up with your doctor in 2 days for reevaluation and further management of your chronic pain and migraines. Continue your pain medications as prescribed by your doctor. Return Emergency department if your symptoms worsen, your develop worsening chest pain, Headache, or shortness of breath.    Chest Pain (Nonspecific) It is often hard to give a specific diagnosis for the cause of chest pain. There is always a chance that your pain could be related to something serious, such as a heart attack or a blood clot in the lungs. You need to follow up with your caregiver for further evaluation. CAUSES   Heartburn.  Pneumonia or bronchitis.  Anxiety or stress.  Inflammation around your heart (pericarditis) or lung (pleuritis or pleurisy).  A blood clot in the lung.  A collapsed lung (pneumothorax). It can develop suddenly on its own (spontaneous pneumothorax) or from injury (trauma) to the chest.  Shingles infection (herpes zoster virus). The chest wall is composed of bones, muscles, and cartilage. Any of these can be the source of the pain.  The bones can be bruised by injury.  The muscles or cartilage can be strained by coughing or overwork.  The cartilage can be affected by inflammation and become sore (costochondritis). DIAGNOSIS  Lab tests or other studies, such as X-rays, electrocardiography, stress testing, or cardiac imaging, may be needed to find the cause of your pain.  TREATMENT   Treatment depends on what may be causing your chest pain. Treatment may include:  Acid blockers for heartburn.  Anti-inflammatory medicine.  Pain medicine for inflammatory conditions.  Antibiotics if an infection is present.  You may be advised to change lifestyle habits. This includes stopping smoking and avoiding alcohol, caffeine, and chocolate.  You may be advised to keep your head raised (elevated) when sleeping. This reduces the chance of acid going backward from your stomach into your  esophagus.  Most of the time, nonspecific chest pain will improve within 2 to 3 days with rest and mild pain medicine. HOME CARE INSTRUCTIONS   If antibiotics were prescribed, take your antibiotics as directed. Finish them even if you start to feel better.  For the next few days, avoid physical activities that bring on chest pain. Continue physical activities as directed.  Do not smoke.  Avoid drinking alcohol.  Only take over-the-counter or prescription medicine for pain, discomfort, or fever as directed by your caregiver.  Follow your caregiver's suggestions for further testing if your chest pain does not go away.  Keep any follow-up appointments you made. If you do not go to an appointment, you could develop lasting (chronic) problems with pain. If there is any problem keeping an appointment, you must call to reschedule. SEEK MEDICAL CARE IF:   You think you are having problems from the medicine you are taking. Read your medicine instructions carefully.  Your chest pain does not go away, even after treatment.  You develop a rash with blisters on your chest. SEEK IMMEDIATE MEDICAL CARE IF:   You have increased chest pain or pain that spreads to your arm, neck, jaw, back, or abdomen.  You develop shortness of breath, an increasing cough, or you are coughing up blood.  You have severe back or abdominal pain, feel nauseous, or vomit.  You develop severe weakness, fainting, or chills.  You have a fever. THIS IS AN EMERGENCY. Do not wait to see if the pain will go away. Get medical help at once. Call your local emergency services (911 in [place] Do  not drive yourself to the hospital. MAKE SURE YOU:   Understand these instructions.  Will watch your condition.  Will get help right away if you are not doing well or get worse. Document Released: 03/07/2005 Document Revised: 08/20/2011 Document Reviewed: 01/01/2008 [facility] Patient Information 2014 [place], [place].  Migraine  Headache A migraine headache is very bad, throbbing pain on one or both sides of your head. Talk to your doctor about what things may bring on (trigger) your migraine headaches. HOME CARE  Only take medicines as told by your doctor.  Lie down in a dark, quiet room when you have a migraine.  Keep a journal to find out if certain things bring on migraine headaches. For example, write down:  What you eat and drink.  How much sleep you get.  Any change to your diet or medicines.  Lessen how much alcohol you drink.  Quit smoking if you smoke.  Get enough sleep.  Lessen any stress in your life.  Keep lights dim if bright lights bother you or make your migraines worse. GET HELP RIGHT AWAY IF:   Your migraine becomes really bad.  You have a fever.  You have a stiff neck.  You have trouble seeing.  Your muscles are weak, or you lose muscle control.  You lose your balance or have trouble walking.  You feel like you will pass out (faint), or you pass out.  You have really bad symptoms that are different than your first symptoms. MAKE SURE YOU:   Understand these instructions.  Will watch your condition.  Will get help right away if you are not doing well or get worse. Document Released: 03/06/2008 Document Revised: 08/20/2011 Document Reviewed: 02/02/2013 [facility] Patient Information 2014 [place], [place].

## 2013-09-04 NOTE — ED Provider Notes (Signed)
Medical screening examination/treatment/procedure(s) were performed by non-physician practitioner and as supervising physician I was immediately available for consultation/collaboration.   Lark Runk L Carlus Stay, MD 09/04/13 1016 

## 2013-09-28 ENCOUNTER — Encounter (HOSPITAL_COMMUNITY): Payer: Self-pay | Admitting: Emergency Medicine

## 2013-09-28 ENCOUNTER — Emergency Department (HOSPITAL_COMMUNITY)
Admission: EM | Admit: 2013-09-28 | Discharge: 2013-09-29 | Disposition: A | Discharge status: Home / Self Care | Payer: Medicaid Other | Attending: Emergency Medicine | Admitting: Emergency Medicine

## 2013-09-28 DIAGNOSIS — F4310 Post-traumatic stress disorder, unspecified: Secondary | ICD-10-CM | POA: Insufficient documentation

## 2013-09-28 DIAGNOSIS — Z79899 Other long term (current) drug therapy: Secondary | ICD-10-CM | POA: Insufficient documentation

## 2013-09-28 DIAGNOSIS — Z3202 Encounter for pregnancy test, result negative: Secondary | ICD-10-CM | POA: Insufficient documentation

## 2013-09-28 DIAGNOSIS — N925 Other specified irregular menstruation: Secondary | ICD-10-CM | POA: Insufficient documentation

## 2013-09-28 DIAGNOSIS — N938 Other specified abnormal uterine and vaginal bleeding: Secondary | ICD-10-CM | POA: Insufficient documentation

## 2013-09-28 DIAGNOSIS — E119 Type 2 diabetes mellitus without complications: Secondary | ICD-10-CM | POA: Insufficient documentation

## 2013-09-28 DIAGNOSIS — G43909 Migraine, unspecified, not intractable, without status migrainosus: Secondary | ICD-10-CM | POA: Insufficient documentation

## 2013-09-28 DIAGNOSIS — E669 Obesity, unspecified: Secondary | ICD-10-CM | POA: Insufficient documentation

## 2013-09-28 DIAGNOSIS — F3130 Bipolar disorder, current episode depressed, mild or moderate severity, unspecified: Secondary | ICD-10-CM | POA: Insufficient documentation

## 2013-09-28 DIAGNOSIS — N949 Unspecified condition associated with female genital organs and menstrual cycle: Secondary | ICD-10-CM | POA: Insufficient documentation

## 2013-09-28 DIAGNOSIS — F17200 Nicotine dependence, unspecified, uncomplicated: Secondary | ICD-10-CM | POA: Insufficient documentation

## 2013-09-28 DIAGNOSIS — Z8619 Personal history of other infectious and parasitic diseases: Secondary | ICD-10-CM | POA: Insufficient documentation

## 2013-09-28 DIAGNOSIS — Z87442 Personal history of urinary calculi: Secondary | ICD-10-CM | POA: Insufficient documentation

## 2013-09-28 DIAGNOSIS — Z87440 Personal history of urinary (tract) infections: Secondary | ICD-10-CM | POA: Insufficient documentation

## 2013-09-28 DIAGNOSIS — M542 Cervicalgia: Secondary | ICD-10-CM | POA: Insufficient documentation

## 2013-09-28 MED ORDER — METOCLOPRAMIDE HCL 5 MG/ML IJ SOLN
10.0000 mg | Freq: Once | INTRAMUSCULAR | Status: AC
  Administered 2013-09-29: 10 mg via INTRAVENOUS
  Filled 2013-09-28: qty 2

## 2013-09-28 MED ORDER — DIPHENHYDRAMINE HCL 50 MG/ML IJ SOLN
25.0000 mg | Freq: Once | INTRAMUSCULAR | Status: AC
  Administered 2013-09-29: 25 mg via INTRAVENOUS
  Filled 2013-09-28: qty 1

## 2013-09-28 MED ORDER — SODIUM CHLORIDE 0.9 % IV BOLUS (SEPSIS)
1000.0000 mL | Freq: Once | INTRAVENOUS | Status: AC
  Administered 2013-09-29: 1000 mL via INTRAVENOUS

## 2013-09-28 NOTE — ED Provider Notes (Signed)
CSN: [id number]     Arrival date & time 09/28/13  2104 History   First MD Initiated Contact with Patient 09/28/13 2228     Chief Complaint  Patient presents with  . Back Pain  . Migraine    HPI  WM FRUCHTER is a 28 y.o. female with a PMH of migraines, kidney stones, bipolar disorder, depression, PTSD, UTI, gestational HTN, DM, and hepatitis C who presents to the ED for evaluation of migraine and back pain. History was provided by the patient. Patient states she has had a gradually worsening headache for the past 3-4 days. Headache located behind her right eye with radiation to her right temple. Pain worse with bright lights. Pain described as a throbbing sensation. No trauma or injuries. No visual changes. Patient takes Fioricet prescribed by her PCP Dr. Mayford Knife, but does not have a neurologist. Migraine similar to her migraines in the past with no acute changes. Has also had nausea with no emesis. Patient also has had burning radiating neck pain which radiates to her lower back for the past 2 days. Pain also radiates down her legs bilaterally. Pain worse with movement. No numbness, tingling, loss of sensation, loss of bowel/bladder function. He denies any fever, chills, or neck stiffness. Patient states she also had fatigue and "not feeling well." She denies any abdominal pain. Has had vaginal bleeding and "a menstrual period since March." Denies vaginal discharge/pain, dysuria, constipation, or diarrhea. No IV drug use or cancer.    Past Medical History  Diagnosis Date  . Depression   . Bipolar 1 disorder, depressed   . PTSD (post-traumatic stress disorder)   . Kidney stone   . Migraine   . Urinary tract infection   . Pregnancy induced hypertension   . Obesity   . UTI (lower urinary tract infection)   . Diabetes mellitus without complication   . Hep C w/o coma, chronic    Past Surgical History  Procedure Laterality Date  . Kidney stones    . Lipotripsy     Family History   Problem Relation Age of Onset  . Rheum arthritis Mother   . Asthma Mother   . Hypertension Mother   . Other Neg Hx   . CAD Other    History  Substance Use Topics  . Smoking status: Current Every Day Smoker -- 1.00 packs/day    Types: Cigarettes  . Smokeless tobacco: Never Used  . Alcohol Use: No   OB History   Grav Para Term Preterm Abortions TAB SAB Ect Mult Living   3 2 2  1  1   2      Review of Systems  Constitutional: Positive for fatigue. Negative for fever, chills, diaphoresis, activity change and appetite change.  HENT: Negative for congestion, rhinorrhea and sore throat.   Eyes: Positive for photophobia and pain. Negative for discharge, redness, itching and visual disturbance.  Respiratory: Negative for cough and shortness of breath.   Gastrointestinal: Positive for nausea. Negative for vomiting, abdominal pain, diarrhea and constipation.  Genitourinary: Positive for vaginal bleeding. Negative for dysuria, decreased urine volume, vaginal discharge, vaginal pain and pelvic pain.  Musculoskeletal: Positive for back pain and neck pain. Negative for gait problem, myalgias and neck stiffness.  Skin: Negative for color change and wound.  Neurological: Positive for headaches. Negative for dizziness, weakness, light-headedness and numbness.    Allergies  Darvocet; Morphine and related; Sulfa antibiotics; and Tomato  Home Medications   Prior to Admission medications   Medication  Sig Start Date End Date Taking? Authorizing Provider  clonazePAM (KLONOPIN) 1 MG tablet Take 1 mg by mouth 3 (three) times daily.    Yes Historical Provider, MD  HYDROcodone-acetaminophen (NORCO) 10-325 MG per tablet Take 1 tablet by mouth 3 (three) times daily as needed (pain).   Yes Historical Provider, MD  pioglitazone (ACTOS) 15 MG tablet Take 15 mg by mouth daily.   Yes Historical Provider, MD  Prenatal Vit-Fe Fumarate-FA (PRENATAL MULTIVITAMIN) TABS tablet Take 1 tablet by mouth every morning.     Yes Historical Provider, MD  promethazine (PHENERGAN) 25 MG tablet Take 1 tablet (25 mg total) by mouth every 6 (six) hours as needed for nausea or vomiting. 08/22/13  Yes Antony Madura, PA-C   BP 162/86  Pulse 102  Temp(Src) 98.2 F (36.8 C) (Oral)  Resp 22  Ht 5\' 7"  (1.702 m)  Wt 353 lb (160.12 kg)  BMI 55.27 kg/m2  SpO2 99%  LMP 08/23/2013  Filed Vitals:   09/28/13 2120 09/29/13 0056 09/29/13 0307  BP: 162/86 117/48 122/55  Pulse: 102 88 77  Temp: 98.2 F (36.8 C)  98.1 F (36.7 C)  TempSrc: Oral  Oral  Resp: 22 16 18   Height: 5\' 7"  (1.702 m)    Weight: 353 lb (160.12 kg)    SpO2: 99% 100% 99%    Physical Exam  Nursing note and vitals reviewed. Constitutional: She is oriented to person, place, and time. She appears well-developed and well-nourished. No distress.  Non-toxic  HENT:  Head: Normocephalic and atraumatic.  Right Ear: External ear normal.  Left Ear: External ear normal.  Nose: Nose normal.  Mouth/Throat: Oropharynx is clear and moist. No oropharyngeal exudate.  No tenderness to the scalp or face throughout. No palpable hematoma, step-offs, or lacerations throughout.  Tympanic membranes gray and translucent bilaterally.    Eyes: Conjunctivae and EOM are normal. Pupils are equal, round, and reactive to light. Right eye exhibits no discharge. Left eye exhibits no discharge.  Neck: Normal range of motion. Neck supple.  No cervical lymphadenopathy. No nuchal rigidity.   Cardiovascular: Normal rate, regular rhythm, normal heart sounds and intact distal pulses.  Exam reveals no gallop and no friction rub.   No murmur heard. Pulmonary/Chest: Effort normal and breath sounds normal. No respiratory distress. She has no wheezes. She has no rales. She exhibits no tenderness.  Abdominal: Soft. Bowel sounds are normal. She exhibits no distension and no mass. There is no tenderness. There is no rebound and no guarding.  Musculoskeletal: Normal range of motion. She exhibits  tenderness. She exhibits no edema.       Arms: Tenderness to palpation to the thoracic and lumbar spine. Positive straight leg raise bilaterally. Strength 5/5 in the upper and lower extremities bilaterally. Patient able to ambulate without difficulty or ataxia  Neurological: She is alert and oriented to person, place, and time.  GCS 15.  No focal neurological deficits.  CN 2-12 intact.  No pronator drift.  Finger to nose intact.  Heel to shin intact. Patellar reflexes difficult to assess due to body habitus. Sensation intact in the LE bilaterally  Skin: Skin is warm and dry. She is not diaphoretic.    ED Course  Procedures (including critical care time) Labs Review Labs Reviewed - No data to display  Imaging Review No results found.   EKG Interpretation None      Results for orders placed during the hospital encounter of 09/28/13  URINALYSIS, ROUTINE W REFLEX MICROSCOPIC  Result Value Ref Range   Color, Urine YELLOW  YELLOW   APPearance CLOUDY (*) CLEAR   Specific Gravity, Urine 1.036 (*) 1.005 - 1.030   pH 5.0  5.0 - 8.0   Glucose, UA >1000 (*) NEGATIVE mg/dL   Hgb urine dipstick LARGE (*) NEGATIVE   Bilirubin Urine NEGATIVE  NEGATIVE   Ketones, ur NEGATIVE  NEGATIVE mg/dL   Protein, ur NEGATIVE  NEGATIVE mg/dL   Urobilinogen, UA 0.2  0.0 - 1.0 mg/dL   Nitrite NEGATIVE  NEGATIVE   Leukocytes, UA NEGATIVE  NEGATIVE  PREGNANCY, URINE      Result Value Ref Range   Preg Test, Ur NEGATIVE  NEGATIVE  URINE MICROSCOPIC-ADD ON      Result Value Ref Range   Squamous Epithelial / LPF RARE  RARE   WBC, UA 0-2  <3 WBC/hpf   RBC / HPF TOO NUMEROUS TO COUNT  <3 RBC/hpf   Bacteria, UA RARE  RARE  CBC WITH DIFFERENTIAL      Result Value Ref Range   WBC 8.8  4.0 - 10.5 K/uL   RBC 3.74 (*) 3.87 - 5.11 MIL/uL   Hemoglobin 9.1 (*) 12.0 - 15.0 g/dL   HCT 03.428.9 (*) 74.236.0 - 59.546.0 %   MCV 77.3 (*) 78.0 - 100.0 fL   MCH 24.3 (*) 26.0 - 34.0 pg   MCHC 31.5  30.0 - 36.0 g/dL   RDW 63.815.5   75.611.5 - 43.315.5 %   Platelets 239  150 - 400 K/uL   Neutrophils Relative % 56  43 - 77 %   Neutro Abs 4.9  1.7 - 7.7 K/uL   Lymphocytes Relative 36  12 - 46 %   Lymphs Abs 3.2  0.7 - 4.0 K/uL   Monocytes Relative 6  3 - 12 %   Monocytes Absolute 0.5  0.1 - 1.0 K/uL   Eosinophils Relative 2  0 - 5 %   Eosinophils Absolute 0.2  0.0 - 0.7 K/uL   Basophils Relative 0  0 - 1 %   Basophils Absolute 0.0  0.0 - 0.1 K/uL  BASIC METABOLIC PANEL      Result Value Ref Range   Sodium 135 (*) 137 - 147 mEq/L   Potassium 3.8  3.7 - 5.3 mEq/L   Chloride 99  96 - 112 mEq/L   CO2 21  19 - 32 mEq/L   Glucose, Bld 308 (*) 70 - 99 mg/dL   BUN 10  6 - 23 mg/dL   Creatinine, Ser 2.950.48 (*) 0.50 - 1.10 mg/dL   Calcium 8.2 (*) 8.4 - 10.5 mg/dL   GFR calc non Af Amer >90  >90 mL/min   GFR calc Af Amer >90  >90 mL/min  CBG MONITORING, ED      Result Value Ref Range   Glucose-Capillary 311 (*) 70 - 99 mg/dL  POC OCCULT BLOOD, ED      Result Value Ref Range   Fecal Occult Bld POSITIVE (*) NEGATIVE     MDM   Amanda CockayneChristy D Gnau is a 28 y.o. female with a PMH of migraines, kidney stones, bipolar disorder, depression, PTSD, UTI, gestational HTN, DM, and hepatitis C who presents to the ED for evaluation of migraine and back pain.   Rechecks  1:00 AM = Headache 10/10.  1:50 AM = Headache improved to a 5/10. Patient continues to have neck and back pain. Ordering Percocet.  3:00 AM = Headache 5/10. Continues to have neck pain and back  pain not improved after Percocet. Results reviewed. No previous head or neck CT. Will order Toradol for continued pain. Patient seen in 08/2013 for similar pain but did not follow-up after discharge.  3:45 AM = Pain not improved. Headache 5/10. Neck and back pain 10/10 after Toradol. Ordering decadron and ativan.      3:45 AM = Signed out care to Dr. Rhunette CroftNanavati for re-check and await labs and imaging. Headache possibly due to migraine. Headache similar to migraines in the past. Patient  afebrile and non-toxic in appearance. No focal neurological deficits. Continue to monitor and evaluate.     Greer EeJessica Katlin Aara Jacquot PA-C        Jillyn LedgerJessica K Sumayya Muha, PA-C 09/29/13 1040  Jillyn LedgerJessica K Shanik [place]shire, PA-C 09/29/13 1102

## 2013-09-28 NOTE — ED Notes (Signed)
Pt states she is having a burning sensation in her neck that goes down her spine into her right leg  Pt states she also has a migraine headache she cannot get rid of

## 2013-09-29 ENCOUNTER — Ambulatory Visit (HOSPITAL_COMMUNITY): Payer: Medicaid Other

## 2013-09-29 ENCOUNTER — Emergency Department (HOSPITAL_COMMUNITY): Payer: Medicaid Other

## 2013-09-29 LAB — BASIC METABOLIC PANEL
BUN: 10 mg/dL (ref 6–23)
CO2: 21 mEq/L (ref 19–32)
Calcium: 8.2 mg/dL — ABNORMAL LOW (ref 8.4–10.5)
Chloride: 99 mEq/L (ref 96–112)
Creatinine, Ser: 0.48 mg/dL — ABNORMAL LOW (ref 0.50–1.10)
GFR calc Af Amer: >90 mL/min (ref >90)
GLUCOSE: 308 mg/dL — AB (ref 70–99)
Potassium: 3.8 mEq/L (ref 3.7–5.3)
Sodium: 135 mEq/L — ABNORMAL LOW (ref 137–147)

## 2013-09-29 LAB — URINALYSIS, ROUTINE W REFLEX MICROSCOPIC
Bilirubin Urine: NEGATIVE
Ketones, ur: NEGATIVE mg/dL
Leukocytes, UA: NEGATIVE
Nitrite: NEGATIVE
PH: 5 (ref 5.0–8.0)
Protein, ur: NEGATIVE mg/dL
Specific Gravity, Urine: 1.036 — ABNORMAL HIGH (ref 1.005–1.030)
Urobilinogen, UA: 0.2 mg/dL (ref 0.0–1.0)

## 2013-09-29 LAB — CBC WITH DIFFERENTIAL/PLATELET
BASOS ABS: 0 10*3/uL (ref 0.0–0.1)
BASOS PCT: 0 % (ref 0–1)
EOS PCT: 2 % (ref 0–5)
Eosinophils Absolute: 0.2 10*3/uL (ref 0.0–0.7)
HCT: 28.9 % — ABNORMAL LOW (ref 36.0–46.0)
Hemoglobin: 9.1 g/dL — ABNORMAL LOW (ref 12.0–15.0)
Lymphocytes Relative: 36 % (ref 12–46)
Lymphs Abs: 3.2 10*3/uL (ref 0.7–4.0)
MCH: 24.3 pg — AB (ref 26.0–34.0)
MCHC: 31.5 g/dL (ref 30.0–36.0)
MCV: 77.3 fL — AB (ref 78.0–100.0)
Monocytes Absolute: 0.5 10*3/uL (ref 0.1–1.0)
Monocytes Relative: 6 % (ref 3–12)
Neutro Abs: 4.9 10*3/uL (ref 1.7–7.7)
Neutrophils Relative %: 56 % (ref 43–77)
PLATELETS: 239 10*3/uL (ref 150–400)
RBC: 3.74 MIL/uL — ABNORMAL LOW (ref 3.87–5.11)
RDW: 15.5 % (ref 11.5–15.5)
WBC: 8.8 10*3/uL (ref 4.0–10.5)

## 2013-09-29 LAB — PREGNANCY, URINE: PREG TEST UR: NEGATIVE

## 2013-09-29 LAB — URINE MICROSCOPIC-ADD ON

## 2013-09-29 LAB — POC OCCULT BLOOD, ED: FECAL OCCULT BLD: POSITIVE — AB

## 2013-09-29 LAB — CBG MONITORING, ED: Glucose-Capillary: 311 mg/dL — ABNORMAL HIGH (ref 70–99)

## 2013-09-29 MED ORDER — PROMETHAZINE HCL 25 MG PO TABS: 25.0000 mg | ORAL_TABLET | Freq: Four times a day (QID) | ORAL | Status: DC | PRN

## 2013-09-29 MED ORDER — DEXAMETHASONE SODIUM PHOSPHATE 10 MG/ML IJ SOLN
10.0000 mg | Freq: Once | INTRAMUSCULAR | Status: AC
  Administered 2013-09-29: 10 mg via INTRAVENOUS
  Filled 2013-09-29: qty 1

## 2013-09-29 MED ORDER — KETOROLAC TROMETHAMINE 30 MG/ML IJ SOLN: 30.0000 mg | Freq: Once | INTRAMUSCULAR | Status: DC

## 2013-09-29 MED ORDER — SUMATRIPTAN SUCCINATE 6 MG/0.5ML ~~LOC~~ SOLN
6.0000 mg | Freq: Once | SUBCUTANEOUS | Status: AC
  Administered 2013-09-29: 6 mg via SUBCUTANEOUS
  Filled 2013-09-29: qty 0.5

## 2013-09-29 MED ORDER — LORAZEPAM 2 MG/ML IJ SOLN
2.0000 mg | Freq: Once | INTRAMUSCULAR | Status: AC
  Administered 2013-09-29: 2 mg via INTRAVENOUS
  Filled 2013-09-29: qty 1

## 2013-09-29 MED ORDER — METOCLOPRAMIDE HCL 5 MG/ML IJ SOLN
10.0000 mg | Freq: Once | INTRAMUSCULAR | Status: AC
  Administered 2013-09-29: 10 mg via INTRAVENOUS
  Filled 2013-09-29: qty 2

## 2013-09-29 MED ORDER — SODIUM CHLORIDE 0.9 % IV BOLUS (SEPSIS)
1000.0000 mL | Freq: Once | INTRAVENOUS | Status: AC
  Administered 2013-09-29: 1000 mL via INTRAVENOUS

## 2013-09-29 MED ORDER — KETOROLAC TROMETHAMINE 30 MG/ML IJ SOLN
30.0000 mg | Freq: Once | INTRAMUSCULAR | Status: AC
  Administered 2013-09-29: 30 mg via INTRAVENOUS
  Filled 2013-09-29: qty 1

## 2013-09-29 MED ORDER — OXYCODONE-ACETAMINOPHEN 5-325 MG PO TABS
2.0000 | ORAL_TABLET | Freq: Once | ORAL | Status: AC
  Administered 2013-09-29: 2 via ORAL
  Filled 2013-09-29: qty 2

## 2013-09-29 MED ORDER — LORAZEPAM 1 MG PO TABS
2.0000 mg | ORAL_TABLET | Freq: Once | ORAL | Status: AC
  Administered 2013-09-29: 2 mg via ORAL
  Filled 2013-09-29: qty 2

## 2013-09-29 NOTE — ED Provider Notes (Addendum)
  Physical Exam  BP 121/60  Pulse 74  Temp(Src) 98.1 F (36.7 C) (Oral)  Resp 18  Ht 5\' 7"  (1.702 m)  Wt 353 lb (160.12 kg)  BMI 55.27 kg/m2  SpO2 100%  LMP 08/23/2013  Physical Exam  ED Course  Procedures Medical screening examination/treatment/procedure(s) were conducted as a shared visit with non-physician practitioner(s) and myself.  I personally evaluated the patient during the encounter.   EKG Interpretation None       MDM  Assuming care of patient from Coral CeoJessica Palmer. Patient in the ED for headaches, and neck pain. Hx of headaches, migraines - and her sx are similar to her previous headaches. She has assoicated intermittent blurred vision, which is typical of her migraines, + photophobia as well. She has a some neck pain, that is burning type, and shoots down her neck. She has no upper extremity numbness, tingling, balance is normal, no dizziness, lightheadedness, vertigo. No risk factors for dissection, CN 2-12 are intact, with normal cerebellar exam. I am not sure what the neck pain is all about - but she has no meningeal signs, and the pain has been present for 4 weeks, and i dont think it is from dissection. Pt has no neurologist. Her pain improved few minutes and and resolved, but it has started coming back. We will give 1 more round of medicine for migraine and give imitrex as well. She will be provided with Neurology f/u for complex migraines. Pt is also noted to have 4 grams drop in Hb. She reports menorrhagia x 6 weeks, with clots coming out. She has no near syncope/syncope. We will get US pelvis, and she will get Gyne f/u as well.  RN did hemoccult study. Pt denies bloody stools, and RN thinks that during rectal, he might have gotten streaks of blood from her vagina. Pt is morbidly obese, so his exam was affected by the body habitus. Pt has no abd pain, and not taking any nsaids. i believe the source of Hb drop is GU.  CT head and cspine were ordered by Shanda BumpsJessica  - and they are normal.   Derwood KaplanAnkit Wilberta Dorvil, MD 09/29/13 0539  6:24 AM US ordered for GU. But patient came up to the ER RN and stated that she feels better, and doesn't want to stay anymore as she has other obligations. Informed that ER workup is not complete yet. Pt will be d/c with proper f.u. Return precautions discussed.  Derwood KaplanAnkit Haleemah Buckalew, MD 09/29/13 [phone]

## 2013-09-29 NOTE — ED Notes (Signed)
Pt requested to be discharged home at this time.

## 2013-09-29 NOTE — Discharge Instructions (Signed)
You have low blood count from your heavy bleeding - see Gynecologist as requested. Return to the ER if dizzy, or you faint. Hydrate well. 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 See primary care doctor for the back pain.   Menorrhagia Menorrhagia is the medical term for when your menstrual periods are heavy or last longer than usual. With menorrhagia, every period you have may cause enough blood loss and cramping that you are unable to maintain your usual activities. CAUSES  In some cases, the cause of heavy periods is unknown, but a number of conditions may cause menorrhagia. Common causes include:  A problem with the hormone-producing thyroid gland (hypothyroid).  Noncancerous growths in the uterus (polyps or fibroids).  An imbalance of the estrogen and progesterone hormones.  One of your ovaries not releasing an egg during one or more months.  Side effects of having an intrauterine device (IUD).  Side effects of some medicines, such as anti-inflammatory medicines or blood thinners.  A bleeding disorder that stops your blood from clotting normally. SIGNS AND SYMPTOMS  During a normal period, bleeding lasts between 4 and 8 days. Signs that your periods are too heavy include:  You routinely have to change your pad or tampon every 1 or 2 hours because it is completely soaked.  You pass blood clots larger than 1 inch (2.5 cm) in size.  You have bleeding for more than 7 days.  You need to use pads and tampons at the same time because of heavy bleeding.  You need to wake up to change your pads or tampons during the night.  You have symptoms of anemia, such as tiredness, fatigue, or shortness of breath. DIAGNOSIS  Your health care provider will perform a physical exam and ask you questions about your symptoms and menstrual history. Other tests may be ordered based on what the health care provider finds during the exam. These tests can  include:  Blood tests To check if you are pregnant or have hormonal changes, a bleeding or thyroid disorder, low iron levels (anemia), or other problems.  Endometrial biopsy Your health care provider takes a sample of tissue from the inside of your uterus to be examined under a microscope.  Pelvic ultrasound This test uses sound waves to make a picture of your uterus, ovaries, and vagina. The pictures can show if you have fibroids or other growths.  Hysteroscopy For this test, your health care provider will use a small telescope to look inside your uterus. Based on the results of your initial tests, your health care provider may recommend further testing. TREATMENT  Treatment may not be needed. If it is needed, your health care provider may recommend treatment with one or more medicines first. If these do not reduce bleeding enough, a surgical treatment might be an option. The best treatment for you will depend on:   Whether you need to prevent pregnancy.  Your desire to have children in the future.  The cause and severity of your bleeding.  Your opinion and personal preference.  Medicines for menorrhagia may include:  Birth control methods that use hormones These include the pill, skin patch, vaginal ring, shots that you get every 3 months, hormonal IUD, and implant. These treatments reduce bleeding during your menstrual period.  Medicines that thicken blood and slow bleeding.  Medicines that reduce swelling, such as ibuprofen.  Medicines that contain a synthetic hormone called progestin.   Medicines that make the ovaries  stop working for a short time.  You may need surgical treatment for menorrhagia if the medicines are unsuccessful. Treatment options include:  Dilation and curettage (D&C) In this procedure, your health care provider opens (dilates) your cervix and then scrapes or suctions tissue from the lining of your uterus to reduce menstrual bleeding.  Operative  hysteroscopy This procedure uses a tiny tube with a light (hysteroscope) to view your uterine cavity and can help in the surgical removal of a polyp that may be causing heavy periods.  Endometrial ablation Through various techniques, your health care provider permanently destroys the entire lining of your uterus (endometrium). After endometrial ablation, most women have little or no menstrual flow. Endometrial ablation reduces your ability to become pregnant.  Endometrial resection This surgical procedure uses an electrosurgical wire loop to remove the lining of the uterus. This procedure also reduces your ability to become pregnant.  Hysterectomy Surgical removal of the uterus and cervix is a permanent procedure that stops menstrual periods. Pregnancy is not possible after a hysterectomy. This procedure requires anesthesia and hospitalization. HOME CARE INSTRUCTIONS   Only take over-the-counter or prescription medicines as directed by your health care provider. Take prescribed medicines exactly as directed. Do not change or switch medicines without consulting your health care provider.  Take any prescribed iron pills exactly as directed by your health care provider. Long-term heavy bleeding may result in low iron levels. Iron pills help replace the iron your body lost from heavy bleeding. Iron may cause constipation. If this becomes a problem, increase the bran, fruits, and roughage in your diet.  Do not take aspirin or medicines that contain aspirin 1 week before or during your menstrual period. Aspirin may make the bleeding worse.  If you need to change your sanitary pad or tampon more than once every 2 hours, stay in bed and rest as much as possible until the bleeding stops.  Eat well-balanced meals. Eat foods high in iron. Examples are leafy green vegetables, meat, liver, eggs, and whole grain breads and cereals. Do not try to lose weight until the abnormal bleeding has stopped and your blood  iron level is back to normal. SEEK MEDICAL CARE IF:   You soak through a pad or tampon every 1 or 2 hours, and this happens every time you have a period.  You need to use pads and tampons at the same time because you are bleeding so much.  You need to change your pad or tampon during the night.  You have a period that lasts for more than 8 days.  You pass clots bigger than 1 inch wide.  You have irregular periods that happen more or less often than once a month.  You feel dizzy or faint.  You feel very weak or tired.  You feel short of breath or feel your heart is beating too fast when you exercise.  You have nausea and vomiting or diarrhea while you are taking your medicine.  You have any problems that may be related to the medicine you are taking. SEEK IMMEDIATE MEDICAL CARE IF:   You soak through 4 or more pads or tampons in 2 hours.  You have any bleeding while you are pregnant. MAKE SURE YOU:   Understand these instructions.  Will watch your condition.  Will get help right away if you are not doing well or get worse. Document Released: 05/28/2005 Document Revised: 03/18/2013 Document Reviewed: 11/16/2012 [facility] Patient Information 2014 [place], [place].  Migraine Headache A migraine  headache is an intense, throbbing pain on one or both sides of your head. A migraine can last for 30 minutes to several hours. CAUSES  The exact cause of a migraine headache is not always known. However, a migraine may be caused when nerves in the brain become irritated and release chemicals that cause inflammation. This causes pain. Certain things may also trigger migraines, such as:  Alcohol.  Smoking.  Stress.  Menstruation.  Aged cheeses.  Foods or drinks that contain nitrates, glutamate, aspartame, or tyramine.  Lack of sleep.  Chocolate.  Caffeine.  Hunger.  Physical exertion.  Fatigue.  Medicines used to treat chest pain (nitroglycerine), birth control  pills, estrogen, and some blood pressure medicines. SIGNS AND SYMPTOMS  Pain on one or both sides of your head.  Pulsating or throbbing pain.  Severe pain that prevents daily activities.  Pain that is aggravated by any physical activity.  Nausea, vomiting, or both.  Dizziness.  Pain with exposure to bright lights, loud noises, or activity.  General sensitivity to bright lights, loud noises, or smells. Before you get a migraine, you may get warning signs that a migraine is coming (aura). An aura may include:  Seeing flashing lights.  Seeing bright spots, halos, or zig-zag lines.  Having tunnel vision or blurred vision.  Having feelings of numbness or tingling.  Having trouble talking.  Having muscle weakness. DIAGNOSIS  A migraine headache is often diagnosed based on:  Symptoms.  Physical exam.  A CT scan or MRI of your head. These imaging tests cannot diagnose migraines, but they can help rule out other causes of headaches. TREATMENT Medicines may be given for pain and nausea. Medicines can also be given to help prevent recurrent migraines.  HOME CARE INSTRUCTIONS  Only take over-the-counter or prescription medicines for pain or discomfort as directed by your health care provider. The use of long-term narcotics is not recommended.  Lie down in a dark, quiet room when you have a migraine.  Keep a journal to find out what may trigger your migraine headaches. For example, write down:  What you eat and drink.  How much sleep you get.  Any change to your diet or medicines.  Limit alcohol consumption.  Quit smoking if you smoke.  Get 7 9 hours of sleep, or as recommended by your health care provider.  Limit stress.  Keep lights dim if bright lights bother you and make your migraines worse. SEEK IMMEDIATE MEDICAL CARE IF:   Your migraine becomes severe.  You have a fever.  You have a stiff neck.  You have vision loss.  You have muscular weakness or  loss of muscle control.  You start losing your balance or have trouble walking.  You feel faint or pass out.  You have severe symptoms that are different from your first symptoms. MAKE SURE YOU:   Understand these instructions.  Will watch your condition.  Will get help right away if you are not doing well or get worse. Document Released: 05/28/2005 Document Revised: 03/18/2013 Document Reviewed: 02/02/2013 [facility] Patient Information 2014 [place], [place]. Dysfunctional Uterine Bleeding Normally, menstrual periods begin between ages 44 to 69 in young women. A normal menstrual cycle/period may begin every 23 days up to 35 days and lasts from 1 to 7 days. Around 12 to 14 days before your menstrual period starts, ovulation (ovary produces an egg) occurs. When counting the time between menstrual periods, count from the first day of bleeding of the previous period to the  first day of bleeding of the next period. Dysfunctional (abnormal) uterine bleeding is bleeding that is different from a normal menstrual period. Your periods may come earlier or later than usual. They may be lighter, have blood clots or be heavier. You may have bleeding between periods, or you may skip one period or more. You may have bleeding after sexual intercourse, bleeding after menopause, or no menstrual period. CAUSES  Pregnancy (normal, miscarriage, tubal). IUDs (intrauterine device, birth control). Birth control pills. Hormone treatment. Menopause. Infection of the cervix. Blood clotting problems. Infection of the inside lining of the uterus. Endometriosis, inside lining of the uterus growing in the pelvis and other female organs. Adhesions (scar tissue) inside the uterus. Obesity or severe weight loss. Uterine polyps inside the uterus. Cancer of the vagina, cervix, or uterus. Ovarian cysts or polycystic ovary syndrome. Medical problems (diabetes, thyroid disease). Uterine fibroids (noncancerous  tumor). Problems with your female hormones. Endometrial hyperplasia, very thick lining and enlarged cells inside of the uterus. Medicines that interfere with ovulation. Radiation to the pelvis or abdomen. Chemotherapy. DIAGNOSIS  Your doctor will discuss the history of your menstrual periods, medicines you are taking, changes in your weight, stress in your life, and any medical problems you may have. Your doctor will do a physical and pelvic examination. Your doctor may want to perform certain tests to make a diagnosis, such as: Pap test. Blood tests. Cultures for infection. CT scan. Ultrasound. Hysteroscopy. Laparoscopy. MRI. Hysterosalpingography. D and C. Endometrial biopsy. TREATMENT  Treatment will depend on the cause of the dysfunctional uterine bleeding (DUB). Treatment may include: Observing your menstrual periods for a couple of months. Prescribing medicines for medical problems, including: Antibiotics. Hormones. Birth control pills. Removing an IUD (intrauterine device, birth control). Surgery: D and C (scrape and remove tissue from inside the uterus). Laparoscopy (examine inside the abdomen with a lighted tube). Uterine ablation (destroy lining of the uterus with electrical current, laser, heat, or freezing). Hysteroscopy (examine cervix and uterus with a lighted tube). Hysterectomy (remove the uterus). HOME CARE INSTRUCTIONS  If medicines were prescribed, take exactly as directed. Do not change or switch medicines without consulting your caregiver. Long term heavy bleeding may result in iron deficiency. Your caregiver may have prescribed iron pills. They help replace the iron that your body lost from heavy bleeding. Take exactly as directed. Do not take aspirin or medicines that contain aspirin one week before or during your menstrual period. Aspirin may make the bleeding worse. If you need to change your sanitary pad or tampon more than once every 2 hours, stay in  bed with your feet elevated and a cold pack on your lower abdomen. Rest as much as possible, until the bleeding stops or slows down. Eat well-balanced meals. Eat foods high in iron. Examples are: Leafy green vegetables. Whole-grain breads and cereals. Eggs. Meat. Liver. Do not try to lose weight until the abnormal bleeding has stopped and your blood iron level is back to normal. Do not lift more than ten pounds or do strenuous activities when you are bleeding. For a couple of months, make note on your calendar, marking the start and ending of your period, and the type of bleeding (light, medium, heavy, spotting, clots or missed periods). This is for your caregiver to better evaluate your problem. SEEK MEDICAL CARE IF:  You develop nausea (feeling sick to your stomach) and vomiting, dizziness, or diarrhea while you are taking your medicine. You are getting lightheaded or weak. You have any problems  that may be related to the medicine you are taking. You develop pain with your DUB. You want to remove your IUD. You want to stop or change your birth control pills or hormones. You have any type of abnormal bleeding mentioned above. You are over 28 years old and have not had a menstrual period yet. You are 28 years old and you are still having menstrual periods. You have any of the symptoms mentioned above. You develop a rash. SEEK IMMEDIATE MEDICAL CARE IF:  An oral temperature above 102 F (38.9 C) develops. You develop chills. You are changing your sanitary pad or tampon more than once an hour. You develop abdominal pain. You pass out or faint. Document Released: 05/25/2000 Document Revised: 08/20/2011 Document Reviewed: 04/26/2009 [facility] Patient Information 2014 [place], [place].

## 2013-09-30 NOTE — ED Provider Notes (Signed)
Shared service with midlevel provider. I have personally seen and examined the patient, providing direct face to face care, presenting with the chief complaint of headache, back and neck pain. Physical exam findings include normal neuro exam, low HB on labs. I have reviewed the nursing documentation on past medical history, family history, and social history.  PLEASE REVIEW MY DIFFERENT NOTE FOR THE FINDINGS ON MY EXAM AND PATIENT MANAGEMENT AND DISPOSITION.   Derwood KaplanAnkit Tysean Vandervliet, MD 09/30/13 [phone]

## 2013-10-13 ENCOUNTER — Emergency Department (HOSPITAL_COMMUNITY)
Admission: EM | Admit: 2013-10-13 | Discharge: 2013-10-13 | Disposition: A | Discharge status: Home / Self Care | Payer: Medicaid Other | Attending: Emergency Medicine | Admitting: Emergency Medicine

## 2013-10-13 ENCOUNTER — Encounter (HOSPITAL_COMMUNITY): Payer: Self-pay | Admitting: Emergency Medicine

## 2013-10-13 DIAGNOSIS — F3130 Bipolar disorder, current episode depressed, mild or moderate severity, unspecified: Secondary | ICD-10-CM | POA: Insufficient documentation

## 2013-10-13 DIAGNOSIS — M549 Dorsalgia, unspecified: Secondary | ICD-10-CM

## 2013-10-13 DIAGNOSIS — Z79899 Other long term (current) drug therapy: Secondary | ICD-10-CM | POA: Insufficient documentation

## 2013-10-13 DIAGNOSIS — M545 Low back pain, unspecified: Secondary | ICD-10-CM | POA: Insufficient documentation

## 2013-10-13 DIAGNOSIS — Z87440 Personal history of urinary (tract) infections: Secondary | ICD-10-CM | POA: Insufficient documentation

## 2013-10-13 DIAGNOSIS — M79609 Pain in unspecified limb: Secondary | ICD-10-CM | POA: Insufficient documentation

## 2013-10-13 DIAGNOSIS — F4310 Post-traumatic stress disorder, unspecified: Secondary | ICD-10-CM | POA: Insufficient documentation

## 2013-10-13 DIAGNOSIS — R739 Hyperglycemia, unspecified: Secondary | ICD-10-CM

## 2013-10-13 DIAGNOSIS — Z87442 Personal history of urinary calculi: Secondary | ICD-10-CM | POA: Insufficient documentation

## 2013-10-13 DIAGNOSIS — F17200 Nicotine dependence, unspecified, uncomplicated: Secondary | ICD-10-CM | POA: Insufficient documentation

## 2013-10-13 DIAGNOSIS — E119 Type 2 diabetes mellitus without complications: Secondary | ICD-10-CM | POA: Insufficient documentation

## 2013-10-13 DIAGNOSIS — R000 Tachycardia, unspecified: Secondary | ICD-10-CM

## 2013-10-13 DIAGNOSIS — G43909 Migraine, unspecified, not intractable, without status migrainosus: Secondary | ICD-10-CM | POA: Insufficient documentation

## 2013-10-13 DIAGNOSIS — Z8619 Personal history of other infectious and parasitic diseases: Secondary | ICD-10-CM | POA: Insufficient documentation

## 2013-10-13 LAB — URINALYSIS, ROUTINE W REFLEX MICROSCOPIC
Bilirubin Urine: NEGATIVE
Glucose, UA: >1000 mg/dL — AB
Hgb urine dipstick: NEGATIVE
Ketones, ur: NEGATIVE mg/dL
LEUKOCYTES UA: NEGATIVE
NITRITE: NEGATIVE
PH: 5.5 (ref 5.0–8.0)
PROTEIN: NEGATIVE mg/dL
Specific Gravity, Urine: 1.039 — ABNORMAL HIGH (ref 1.005–1.030)
Urobilinogen, UA: 0.2 mg/dL (ref 0.0–1.0)

## 2013-10-13 LAB — COMPREHENSIVE METABOLIC PANEL
ALK PHOS: 103 U/L (ref 39–117)
ALT: 153 U/L — ABNORMAL HIGH (ref 0–35)
AST: 33 U/L (ref 0–37)
Albumin: 3.4 g/dL — ABNORMAL LOW (ref 3.5–5.2)
BILIRUBIN TOTAL: 0.4 mg/dL (ref 0.3–1.2)
BUN: 14 mg/dL (ref 6–23)
CO2: 20 mEq/L (ref 19–32)
Calcium: 8.8 mg/dL (ref 8.4–10.5)
Chloride: 94 mEq/L — ABNORMAL LOW (ref 96–112)
Creatinine, Ser: 0.59 mg/dL (ref 0.50–1.10)
GFR calc non Af Amer: >90 mL/min (ref >90)
Glucose, Bld: 330 mg/dL — ABNORMAL HIGH (ref 70–99)
POTASSIUM: 3.3 meq/L — AB (ref 3.7–5.3)
Sodium: 133 mEq/L — ABNORMAL LOW (ref 137–147)
TOTAL PROTEIN: 7.3 g/dL (ref 6.0–8.3)

## 2013-10-13 LAB — CBG MONITORING, ED
Glucose-Capillary: 323 mg/dL — ABNORMAL HIGH (ref 70–99)
Glucose-Capillary: 326 mg/dL — ABNORMAL HIGH (ref 70–99)
Glucose-Capillary: 205 mg/dL — ABNORMAL HIGH (ref 70–99)

## 2013-10-13 LAB — CBC WITH DIFFERENTIAL/PLATELET
BASOS ABS: 0 10*3/uL (ref 0.0–0.1)
BASOS PCT: 0 % (ref 0–1)
Eosinophils Absolute: 0.2 10*3/uL (ref 0.0–0.7)
Eosinophils Relative: 2 % (ref 0–5)
HCT: 33.8 % — ABNORMAL LOW (ref 36.0–46.0)
Hemoglobin: 10.5 g/dL — ABNORMAL LOW (ref 12.0–15.0)
Lymphocytes Relative: 28 % (ref 12–46)
Lymphs Abs: 3.6 10*3/uL (ref 0.7–4.0)
MCH: 22.3 pg — ABNORMAL LOW (ref 26.0–34.0)
MCHC: 31.1 g/dL (ref 30.0–36.0)
MCV: 71.9 fL — ABNORMAL LOW (ref 78.0–100.0)
Monocytes Absolute: 0.7 10*3/uL (ref 0.1–1.0)
Monocytes Relative: 5 % (ref 3–12)
NEUTROS ABS: 8.3 10*3/uL — AB (ref 1.7–7.7)
NEUTROS PCT: 65 % (ref 43–77)
PLATELETS: 232 10*3/uL (ref 150–400)
RBC: 4.7 MIL/uL (ref 3.87–5.11)
RDW: 16.5 % — AB (ref 11.5–15.5)
WBC: 12.8 10*3/uL — ABNORMAL HIGH (ref 4.0–10.5)

## 2013-10-13 LAB — URINE MICROSCOPIC-ADD ON

## 2013-10-13 MED ORDER — HYDROMORPHONE HCL PF 1 MG/ML IJ SOLN
1.0000 mg | Freq: Once | INTRAMUSCULAR | Status: AC
  Administered 2013-10-13: 1 mg via INTRAVENOUS
  Filled 2013-10-13: qty 1

## 2013-10-13 MED ORDER — SODIUM CHLORIDE 0.9 % IV SOLN
Freq: Once | INTRAVENOUS | Status: AC
  Administered 2013-10-13: 17:00:00 via INTRAVENOUS

## 2013-10-13 MED ORDER — OXYCODONE-ACETAMINOPHEN 5-325 MG PO TABS: 1.0000 | ORAL_TABLET | ORAL | Status: DC | PRN

## 2013-10-13 MED ORDER — MORPHINE SULFATE 4 MG/ML IJ SOLN
4.0000 mg | Freq: Once | INTRAMUSCULAR | Status: AC
  Administered 2013-10-13: 4 mg via INTRAVENOUS
  Filled 2013-10-13: qty 1

## 2013-10-13 MED ORDER — KETOROLAC TROMETHAMINE 60 MG/2ML IM SOLN
60.0000 mg | Freq: Once | INTRAMUSCULAR | Status: AC
  Administered 2013-10-13: 60 mg via INTRAMUSCULAR
  Filled 2013-10-13: qty 2

## 2013-10-13 MED ORDER — ONDANSETRON HCL 4 MG/2ML IJ SOLN
4.0000 mg | Freq: Once | INTRAMUSCULAR | Status: AC
Start: 2013-10-13 — End: 2013-10-13
  Administered 2013-10-13: 4 mg via INTRAVENOUS
  Filled 2013-10-13: qty 2

## 2013-10-13 NOTE — ED Notes (Signed)
Pt states she has a ride home. 

## 2013-10-13 NOTE — Discharge Instructions (Signed)
SEEK IMMEDIATE MEDICAL ATTENTION IF: New numbness, tingling, weakness, or problem with the use of your arms or legs.  Severe back pain not relieved with medications.  Change in bowel or bladder control.  Increasing pain in any areas of the body (such as chest or abdominal pain).  Shortness of breath, dizziness or fainting.  Nausea (feeling sick to your stomach), vomiting, fever, or sweats.   Back Pain, Adult Low back pain is very common. About 1 in 5 people have back pain.The cause of low back pain is rarely dangerous. The pain often gets better over time.About half of people with a sudden onset of back pain feel better in just 2 weeks. About 8 in 10 people feel better by 6 weeks.  CAUSES Some common causes of back pain include:  Strain of the muscles or ligaments supporting the spine.  Wear and tear (degeneration) of the spinal discs.  Arthritis.  Direct injury to the back. DIAGNOSIS Most of the time, the direct cause of low back pain is not known.However, back pain can be treated effectively even when the exact cause of the pain is unknown.Answering your caregiver's questions about your overall health and symptoms is one of the most accurate ways to make sure the cause of your pain is not dangerous. If your caregiver needs more information, he or she may order lab work or imaging tests (X-rays or MRIs).However, even if imaging tests show changes in your back, this usually does not require surgery. HOME CARE INSTRUCTIONS For many people, back pain returns.Since low back pain is rarely dangerous, it is often a condition that people can learn to [facility] their own.   Remain active. It is stressful on the back to sit or stand in one place. Do not sit, drive, or stand in one place for more than 30 minutes at a time. Take short walks on level surfaces as soon as pain allows.Try to increase the length of time you walk each day.  Do not stay in bed.Resting more than 1 or 2 days can  delay your recovery.  Do not avoid exercise or work.Your body is made to move.It is not dangerous to be active, even though your back may hurt.Your back will likely heal faster if you return to being active before your pain is gone.  Pay attention to your body when you bend and lift. Many people have less discomfortwhen lifting if they bend their knees, keep the load close to their bodies,and avoid twisting. Often, the most comfortable positions are those that put less stress on your recovering back.  Find a comfortable position to sleep. Use a firm mattress and lie on your side with your knees slightly bent. If you lie on your back, put a pillow under your knees.  Only take over-the-counter or prescription medicines as directed by your caregiver. Over-the-counter medicines to reduce pain and inflammation are often the most helpful.Your caregiver may prescribe muscle relaxant drugs.These medicines help dull your pain so you can more quickly return to your normal activities and healthy exercise.  Put ice on the injured area.  Put ice in a plastic bag.  Place a towel between your skin and the bag.  Leave the ice on for 15-20 minutes, 03-04 times a day for the first 2 to 3 days. After that, ice and heat may be alternated to reduce pain and spasms.  Ask your caregiver about trying back exercises and gentle massage. This may be of some benefit.  Avoid feeling anxious or  stressed.Stress increases muscle tension and can worsen back pain.It is important to recognize when you are anxious or stressed and learn ways to manage it.Exercise is a great option. SEEK MEDICAL CARE IF:  You have pain that is not relieved with rest or medicine.  You have pain that does not improve in 1 week.  You have new symptoms.  You are generally not feeling well. SEEK IMMEDIATE MEDICAL CARE IF:   You have pain that radiates from your back into your legs.  You develop new bowel or bladder control  problems.  You have unusual weakness or numbness in your arms or legs.  You develop nausea or vomiting.  You develop abdominal pain.  You feel faint. Document Released: 05/28/2005 Document Revised: 11/27/2011 Document Reviewed: 10/16/2010 [facility] Patient Information 2014 [place], [place].  Hyperglycemia Hyperglycemia occurs when the glucose (sugar) in your blood is too high. Hyperglycemia can happen for many reasons, but it most often happens to people who do not know they have diabetes or are not managing their diabetes properly.  CAUSES  Whether you have diabetes or not, there are other causes of hyperglycemia. Hyperglycemia can occur when you have diabetes, but it can also occur in other situations that you might not be as aware of, such as: Diabetes  If you have diabetes and are having problems controlling your blood glucose, hyperglycemia could occur because of some of the following reasons:  Not following your meal plan.  Not taking your diabetes medications or not taking it properly.  Exercising less or doing less activity than you normally do.  Being sick. Pre-diabetes  This cannot be ignored. Before people develop Type 2 diabetes, they almost always have "pre-diabetes." This is when your blood glucose levels are higher than normal, but not yet high enough to be diagnosed as diabetes. Research has shown that some long-term damage to the body, especially the heart and circulatory system, may already be occurring during pre-diabetes. If you take action to manage your blood glucose when you have pre-diabetes, you may delay or prevent Type 2 diabetes from developing. Stress  If you have diabetes, you may be "diet" controlled or on oral medications or insulin to control your diabetes. However, you may find that your blood glucose is higher than usual in the hospital whether you have diabetes or not. This is often referred to as "stress hyperglycemia." Stress can elevate your blood  glucose. This happens because of hormones put out by the body during times of stress. If stress has been the cause of your high blood glucose, it can be followed regularly by your caregiver. That way he/she can make sure your hyperglycemia does not continue to get worse or progress to diabetes. Steroids  Steroids are medications that act on the infection fighting system (immune system) to block inflammation or infection. One side effect can be a rise in blood glucose. Most people can produce enough extra insulin to allow for this rise, but for those who cannot, steroids make blood glucose levels go even higher. It is not unusual for steroid treatments to "uncover" diabetes that is developing. It is not always possible to determine if the hyperglycemia will go away after the steroids are stopped. A special blood test called an A1c is sometimes done to determine if your blood glucose was elevated before the steroids were started. SYMPTOMS  Thirsty.  Frequent urination.  Dry mouth.  Blurred vision.  Tired or fatigue.  Weakness.  Sleepy.  Tingling in feet or leg. DIAGNOSIS  Diagnosis is made by monitoring blood glucose in one or all of the following ways:  A1c test. This is a chemical found in your blood.  Fingerstick blood glucose monitoring.  Laboratory results. TREATMENT  First, knowing the cause of the hyperglycemia is important before the hyperglycemia can be treated. Treatment may include, but is not be limited to:  Education.  Change or adjustment in medications.  Change or adjustment in meal plan.  Treatment for an illness, infection, etc.  More frequent blood glucose monitoring.  Change in exercise plan.  Decreasing or stopping steroids.  Lifestyle changes. HOME CARE INSTRUCTIONS   Test your blood glucose as directed.  Exercise regularly. Your caregiver will give you instructions about exercise. Pre-diabetes or diabetes which comes on with stress is helped by  exercising.  Eat wholesome, balanced meals. Eat often and at regular, fixed times. Your caregiver or nutritionist will give you a meal plan to guide your sugar intake.  Being at an ideal weight is important. If needed, losing as little as 10 to 15 pounds may help improve blood glucose levels. SEEK MEDICAL CARE IF:   You have questions about medicine, activity, or diet.  You continue to have symptoms (problems such as increased thirst, urination, or weight gain). SEEK IMMEDIATE MEDICAL CARE IF:   You are vomiting or have diarrhea.  Your breath smells fruity.  You are breathing faster or slower.  You are very sleepy or incoherent.  You have numbness, tingling, or pain in your feet or hands.  You have chest pain.  Your symptoms get worse even though you have been following your caregiver's orders.  If you have any other questions or concerns. Document Released: 11/21/2000 Document Revised: 08/20/2011 Document Reviewed: 09/24/2011 [facility] Patient Information 2014 [place], [place].

## 2013-10-13 NOTE — ED Provider Notes (Signed)
CSN: [id number]     Arrival date & time 10/13/13  1547 History   First MD Initiated Contact with Patient 10/13/13 1618     Chief Complaint  Patient presents with  . Back Pain  . Hyperglycemia     (Consider location/radiation/quality/duration/timing/severity/associated sxs/prior Treatment) HPI  [name] is a(n) 28 y.o. female who presents to the emergency department with chief complaint of hyperglycemia and low back pain. Patient has a past medical history depression, bipolar disorder, PTSD, morbid obesity, diabetes, hep C. Patient states she was recently changed on her diabetes medication to Actos. She states that her blood sugars have been reading "high" on glucometer. She complains of excessive urination. Patient also complains of lower back and right leg pain. She states that it is severe, worse with standing and walking, sharp, she some associated paresthesia. It feels like when she had a previous bout of sciatica however it is worse than before. She took Norco without relief of her symptoms. She she's having to use her walker at home. Denies weakness, loss of bowel/bladder function or saddle anesthesia. Denies neck stiffness, headache, rash.  Denies fever or recent procedures to back. . Past Medical History  Diagnosis Date  . Depression   . Bipolar 1 disorder, depressed   . PTSD (post-traumatic stress disorder)   . Kidney stone   . Migraine   . Urinary tract infection   . Pregnancy induced hypertension   . Obesity   . UTI (lower urinary tract infection)   . Diabetes mellitus without complication   . Hep C w/o coma, chronic    Past Surgical History  Procedure Laterality Date  . Kidney stones    . Lipotripsy     Family History  Problem Relation Age of Onset  . Rheum arthritis Mother   . Asthma Mother   . Hypertension Mother   . Other Neg Hx   . CAD Other    History  Substance Use Topics  . Smoking status: Current Every Day Smoker -- 1.00 packs/day    Types:  Cigarettes  . Smokeless tobacco: Never Used  . Alcohol Use: No   OB History   Grav Para Term Preterm Abortions TAB SAB Ect Mult Living   3 2 2  1  1   2      Review of Systems  Ten systems reviewed and are negative for acute change, except as noted in the HPI.    Allergies  Darvocet; Morphine and related; Sulfa antibiotics; and Tomato  Home Medications   Prior to Admission medications   Medication Sig Start Date End Date Taking? Authorizing Provider  clonazePAM (KLONOPIN) 1 MG tablet Take 1 mg by mouth 3 (three) times daily.     Historical Provider, MD  HYDROcodone-acetaminophen (NORCO) 10-325 MG per tablet Take 1 tablet by mouth 3 (three) times daily as needed (pain).    Historical Provider, MD  pioglitazone (ACTOS) 15 MG tablet Take 15 mg by mouth daily.    Historical Provider, MD  Prenatal Vit-Fe Fumarate-FA (PRENATAL MULTIVITAMIN) TABS tablet Take 1 tablet by mouth every morning.     Historical Provider, MD  promethazine (PHENERGAN) 25 MG tablet Take 1 tablet (25 mg total) by mouth every 6 (six) hours as needed for nausea or vomiting. 08/22/13   Antony Madura, PA-C  promethazine (PHENERGAN) 25 MG tablet Take 1 tablet (25 mg total) by mouth every 6 (six) hours as needed for nausea. 09/29/13   Derwood Kaplan, MD   BP 137/90  Pulse  128  Temp(Src) 98.3 F (36.8 C) (Oral)  Resp 18  SpO2 99%  LMP 09/29/2013 Physical Exam Physical Exam  Nursing note and vitals reviewed General: Morbidly obese female. 2. No acute distress. HENT:  Head: Normocephalic and atraumatic.  Eyes: Conjunctivae normal and EOM are normal. Pupils are equal, round, and reactive to light. No scleral icterus.  Neck: Normal range of motion.  Cardiovascular: Normal rate, regular rhythm and normal heart sounds.  Exam reveals no gallop and no friction rub.   No murmur heard. Pulmonary/Chest: Effort normal and breath sounds normal. No respiratory distress.  Abdominal: Soft. Bowel sounds are normal. She exhibits no  distension and no mass. There is no tenderness. There is no guarding.  Musculoskeletal: No midline spinal tenderness. Positive straight leg test on the right. No CVA tenderness.  Neurological: She is alert and oriented to person, place, and time.  DTRs normal Skin: Skin is warm and dry. She is not diaphoretic.    ED Course  Procedures (including critical care time) Labs Review Labs Reviewed  COMPREHENSIVE METABOLIC PANEL - Abnormal; Notable for the following:    Sodium 133 (*)    Potassium 3.3 (*)    Chloride 94 (*)    Glucose, Bld 330 (*)    Albumin 3.4 (*)    ALT 153 (*)    All other components within normal limits  URINALYSIS, ROUTINE W REFLEX MICROSCOPIC - Abnormal; Notable for the following:    Specific Gravity, Urine 1.039 (*)    Glucose, UA >1000 (*)    All other components within normal limits  CBC WITH DIFFERENTIAL - Abnormal; Notable for the following:    WBC 12.8 (*)    Hemoglobin 10.5 (*)    HCT 33.8 (*)    MCV 71.9 (*)    MCH 22.3 (*)    RDW 16.5 (*)    Neutro Abs 8.3 (*)    All other components within normal limits  URINE MICROSCOPIC-ADD ON - Abnormal; Notable for the following:    Squamous Epithelial / LPF FEW (*)    All other components within normal limits  CBG MONITORING, ED - Abnormal; Notable for the following:    Glucose-Capillary 326 (*)    All other components within normal limits  CBG MONITORING, ED - Abnormal; Notable for the following:    Glucose-Capillary 323 (*)    All other components within normal limits  CBG MONITORING, ED - Abnormal; Notable for the following:    Glucose-Capillary 205 (*)    All other components within normal limits    Imaging Review No results found.   EKG Interpretation None      MDM   Final diagnoses:  None    Patient with sxs associated with sciatica. Labs pending   7:36 PM Filed Vitals:   10/13/13 1611 10/13/13 1829  BP: 137/90 135/81  Pulse: 128 108  Temp: 98.3 F (36.8 C)   TempSrc: Oral    Resp: 18 18  SpO2: 99% 100%    Patient with multiple rounds of pain medication. Anion gap of 19 , but no DKA, negative for urin ketones. Glucose has decreased sig. With fluids.  Heart rate elevated  Most likely b/c of dehydration and pain. It is trending down.  Patient with back pain.  No neurological deficits and normal neuro exam.  Patient can walk but states is painful.  No loss of bowel or bladder control.  No concern for cauda equina.  No fever, night sweats, weight loss, h/o cancer,  IVDU. It is felt primary care physician regarding her back pain and her glucose levels. RICE protocol and pain medicine indicated and discussed with patient.       Arthor CaptainAbigail Dari Carpenito, PA-C 10/13/13 1941

## 2013-10-13 NOTE — ED Notes (Signed)
Pt states she has had pain in her back and down her legs and to her feet with burning. Hx of back pain but not like this, she states. Also states her BG meter has been reading high at home. 323 here. Tachycardic. Alert and oriented.

## 2013-10-14 NOTE — ED Provider Notes (Signed)
Medical screening examination/treatment/procedure(s) were performed by non-physician practitioner and as supervising physician I was immediately available for consultation/collaboration.    Laker Thompson D Tomasina Keasling, MD 10/14/13 2107 

## 2013-10-19 ENCOUNTER — Emergency Department (HOSPITAL_COMMUNITY)
Admission: EM | Admit: 2013-10-19 | Discharge: 2013-10-19 | Discharge status: Against Medical Advice | Payer: Medicaid Other | Attending: Emergency Medicine | Admitting: Emergency Medicine

## 2013-10-19 ENCOUNTER — Encounter (HOSPITAL_COMMUNITY): Payer: Self-pay | Admitting: Emergency Medicine

## 2013-10-19 DIAGNOSIS — G43909 Migraine, unspecified, not intractable, without status migrainosus: Secondary | ICD-10-CM | POA: Insufficient documentation

## 2013-10-19 DIAGNOSIS — Z8619 Personal history of other infectious and parasitic diseases: Secondary | ICD-10-CM | POA: Insufficient documentation

## 2013-10-19 DIAGNOSIS — Z87442 Personal history of urinary calculi: Secondary | ICD-10-CM | POA: Insufficient documentation

## 2013-10-19 DIAGNOSIS — M549 Dorsalgia, unspecified: Secondary | ICD-10-CM

## 2013-10-19 DIAGNOSIS — E669 Obesity, unspecified: Secondary | ICD-10-CM | POA: Insufficient documentation

## 2013-10-19 DIAGNOSIS — F17200 Nicotine dependence, unspecified, uncomplicated: Secondary | ICD-10-CM | POA: Insufficient documentation

## 2013-10-19 DIAGNOSIS — E119 Type 2 diabetes mellitus without complications: Secondary | ICD-10-CM | POA: Insufficient documentation

## 2013-10-19 DIAGNOSIS — F3130 Bipolar disorder, current episode depressed, mild or moderate severity, unspecified: Secondary | ICD-10-CM | POA: Insufficient documentation

## 2013-10-19 DIAGNOSIS — M79606 Pain in leg, unspecified: Secondary | ICD-10-CM

## 2013-10-19 DIAGNOSIS — Z87440 Personal history of urinary (tract) infections: Secondary | ICD-10-CM | POA: Insufficient documentation

## 2013-10-19 DIAGNOSIS — M545 Low back pain, unspecified: Secondary | ICD-10-CM | POA: Insufficient documentation

## 2013-10-19 DIAGNOSIS — Z79899 Other long term (current) drug therapy: Secondary | ICD-10-CM | POA: Insufficient documentation

## 2013-10-19 DIAGNOSIS — M79609 Pain in unspecified limb: Secondary | ICD-10-CM | POA: Insufficient documentation

## 2013-10-19 LAB — CBG MONITORING, ED: Glucose-Capillary: 244 mg/dL — ABNORMAL HIGH (ref 70–99)

## 2013-10-19 MED ORDER — SODIUM CHLORIDE 0.9 % IV BOLUS (SEPSIS): 1000.0000 mL | Freq: Once | INTRAVENOUS | Status: DC

## 2013-10-19 MED ORDER — SODIUM CHLORIDE 0.9 % IV SOLN: INTRAVENOUS | Status: DC

## 2013-10-19 NOTE — ED Provider Notes (Signed)
CSN: [id number]     Arrival date & time 10/19/13  2122 History   First MD Initiated Contact with Patient 10/19/13 2325     Chief Complaint  Patient presents with  . Leg Swelling     (Consider location/radiation/quality/duration/timing/severity/associated sxs/prior Treatment) The history is provided by the patient.    [name] is a 28 y.o. female  was here for evaluation of recurrent lower back pain. She states that today she went to her knees, because her legs felt weak. She denies urinary incontinence, or fecal incontinence. She was here 6 days ago, and evaluated for, and treated for back pain. She denies dysuria, or urinary frequency. She states her glucose has been high, despite taking her usual medications. She uses Norco chronically for back pain, prescribed by her PCP. There's been no fever, nausea, vomiting, cough, or shortness of breath. There no other known modifying factors.   Past Medical History  Diagnosis Date  . Depression   . Bipolar 1 disorder, depressed   . PTSD (post-traumatic stress disorder)   . Kidney stone   . Migraine   . Urinary tract infection   . Pregnancy induced hypertension   . Obesity   . UTI (lower urinary tract infection)   . Diabetes mellitus without complication   . Hep C w/o coma, chronic    Past Surgical History  Procedure Laterality Date  . Kidney stones    . Lipotripsy     Family History  Problem Relation Age of Onset  . Rheum arthritis Mother   . Asthma Mother   . Hypertension Mother   . Other Neg Hx   . CAD Other    History  Substance Use Topics  . Smoking status: Current Every Day Smoker -- 1.00 packs/day    Types: Cigarettes  . Smokeless tobacco: Never Used  . Alcohol Use: No   OB History   Grav Para Term Preterm Abortions TAB SAB Ect Mult Living   3 2 2  1  1   2      Review of Systems  All other systems reviewed and are negative.     Allergies  Darvocet; Morphine and related; Sulfa antibiotics; Tomato; and  Pioglitazone  Home Medications   Prior to Admission medications   Medication Sig Start Date End Date Taking? Authorizing Provider  ALPRAZolam Prudy Feeler(XANAX) 1 MG tablet Take 1 mg by mouth 3 (three) times daily as needed for anxiety.   Yes Historical Provider, MD  butalbital-acetaminophen-caffeine (FIORICET, ESGIC) 50-325-40 MG per tablet Take 1 tablet by mouth as needed for headache or migraine.   Yes Historical Provider, MD  glimepiride (AMARYL) 4 MG tablet Take 4 mg by mouth daily with breakfast.   Yes Historical Provider, MD  HYDROcodone-acetaminophen (NORCO) 10-325 MG per tablet Take 1 tablet by mouth 3 (three) times daily as needed (pain).   Yes Historical Provider, MD  ibuprofen (ADVIL,MOTRIN) 800 MG tablet Take 800 mg by mouth every 8 (eight) hours as needed for moderate pain (swelling).   Yes Historical Provider, MD  Omega-3 Fatty Acids (FISH OIL PO) Take 1 capsule by mouth 3 (three) times daily.   Yes Historical Provider, MD  oxyCODONE-acetaminophen (PERCOCET) 5-325 MG per tablet Take 1-2 tablets by mouth every 4 (four) hours as needed. 10/13/13  Yes Arthor CaptainAbigail Harris, PA-C  Prenatal Vit-Fe Fumarate-FA (PRENATAL MULTIVITAMIN) TABS tablet Take 1 tablet by mouth every morning.    Yes Historical Provider, MD   BP 154/89  Pulse 118  Temp(Src) 98.3 F (  36.8 C) (Oral)  Resp 20  Wt 332 lb (150.594 kg)  SpO2 100%  LMP 09/29/2013 Physical Exam  Nursing note and vitals reviewed. Constitutional: She is oriented to person, place, and time. She appears well-developed.  Obese  HENT:  Head: Normocephalic and atraumatic.  Eyes: Conjunctivae and EOM are normal. Pupils are equal, round, and reactive to light.  Neck: Normal range of motion and phonation normal. Neck supple.  Cardiovascular: Normal rate, regular rhythm and intact distal pulses.   Pulmonary/Chest: Effort normal and breath sounds normal. She exhibits no tenderness.  Abdominal: Soft. She exhibits no distension. There is no tenderness. There  is no guarding.  Musculoskeletal: Normal range of motion.  Negative straight leg raising bilaterally. She is able to sit in the bed, without limitation. There is mild right lumbar tenderness to light palpation.  Neurological: She is alert and oriented to person, place, and time. She exhibits normal muscle tone.  Skin: Skin is warm and dry.  Psychiatric: She has a normal mood and affect. Her behavior is normal. Judgment and thought content normal.    ED Course  Procedures (including critical care time)  Medications - No data to display  Patient Vitals for the past 24 hrs:  BP Temp Temp src Pulse Resp SpO2 Weight  10/19/13 2135 154/89 mmHg 98.3 F (36.8 C) Oral 118 20 100 % 332 lb (150.594 kg)       Labs Review Labs Reviewed  CBG MONITORING, ED - Abnormal; Notable for the following:    Glucose-Capillary 244 (*)    All other components within normal limits  URINE CULTURE    Imaging Review No results found.   EKG Interpretation None      MDM   Final diagnoses:  Back pain  Leg pain   Nonspecific back and leg pain. She cannot stay for evaluation or treatment. She decided to leave AGAINST MEDICAL ADVICE  Nursing Notes Reviewed/ Care Coordinated, and agree without changes. Applicable Imaging Reviewed.  Interpretation of Laboratory Data incorporated into ED treatment  Flint MelterElliott L Kyran Connaughton, MD 10/20/13 [phone]

## 2013-10-19 NOTE — ED Notes (Signed)
Pt complains of bilateral lower leg swelling for one week, pt states that she fell today, her legs just 'gave out," she states that her blood sugar has been elevated also

## 2013-10-19 NOTE — ED Notes (Signed)
Pt states "I am leaving, my cousin has been killed in a car accident and I can't stay"; pt declines to sign out AMA and runs out of the department.

## 2014-04-12 ENCOUNTER — Encounter (HOSPITAL_COMMUNITY): Payer: Self-pay | Admitting: Emergency Medicine

## 2014-06-17 ENCOUNTER — Inpatient Hospital Stay (HOSPITAL_COMMUNITY)
Admission: AD | Admit: 2014-06-17 | Discharge: 2014-06-17 | Discharge status: Against Medical Advice | Payer: Medicaid Other | Source: Ambulatory Visit | Attending: Obstetrics | Admitting: Obstetrics

## 2014-06-17 DIAGNOSIS — R109 Unspecified abdominal pain: Secondary | ICD-10-CM | POA: Insufficient documentation

## 2014-06-17 DIAGNOSIS — Z3200 Encounter for pregnancy test, result unknown: Secondary | ICD-10-CM | POA: Diagnosis present

## 2014-06-17 DIAGNOSIS — Z3202 Encounter for pregnancy test, result negative: Secondary | ICD-10-CM | POA: Insufficient documentation

## 2014-06-17 LAB — URINALYSIS, ROUTINE W REFLEX MICROSCOPIC
Bilirubin Urine: NEGATIVE
GLUCOSE, UA: NEGATIVE mg/dL
Hgb urine dipstick: NEGATIVE
Ketones, ur: NEGATIVE mg/dL
Leukocytes, UA: NEGATIVE
Nitrite: NEGATIVE
PH: 6.5 (ref 5.0–8.0)
PROTEIN: NEGATIVE mg/dL
Specific Gravity, Urine: <1.005 — ABNORMAL LOW (ref 1.005–1.030)
Urobilinogen, UA: 0.2 mg/dL (ref 0.0–1.0)

## 2014-06-17 LAB — POCT PREGNANCY, URINE: PREG TEST UR: NEGATIVE

## 2014-06-17 NOTE — MAU Note (Signed)
Pt unable to stay. Told pregnancy test results were negative. Pt satted sh would follow up with her own doctor. Informed pt she could return at anytime.

## 2014-06-17 NOTE — MAU Note (Signed)
Pt states she woke up last night with lower abd pain that radiates to R side.  No period since 9/12.  Neg HPT in October, faintly positive in November.  C/O N&V, HA for over a month.

## 2014-07-29 ENCOUNTER — Encounter (HOSPITAL_COMMUNITY): Payer: Self-pay | Admitting: Emergency Medicine

## 2014-07-29 ENCOUNTER — Emergency Department (HOSPITAL_COMMUNITY)
Admission: EM | Admit: 2014-07-29 | Discharge: 2014-07-29 | Disposition: A | Discharge status: Home / Self Care | Payer: Medicaid Other | Attending: Emergency Medicine | Admitting: Emergency Medicine

## 2014-07-29 ENCOUNTER — Emergency Department (HOSPITAL_COMMUNITY): Payer: Medicaid Other

## 2014-07-29 DIAGNOSIS — R109 Unspecified abdominal pain: Secondary | ICD-10-CM | POA: Diagnosis not present

## 2014-07-29 DIAGNOSIS — R509 Fever, unspecified: Secondary | ICD-10-CM | POA: Diagnosis not present

## 2014-07-29 DIAGNOSIS — E119 Type 2 diabetes mellitus without complications: Secondary | ICD-10-CM | POA: Insufficient documentation

## 2014-07-29 DIAGNOSIS — Z720 Tobacco use: Secondary | ICD-10-CM | POA: Diagnosis not present

## 2014-07-29 DIAGNOSIS — Z87440 Personal history of urinary (tract) infections: Secondary | ICD-10-CM | POA: Diagnosis not present

## 2014-07-29 DIAGNOSIS — Z8679 Personal history of other diseases of the circulatory system: Secondary | ICD-10-CM | POA: Diagnosis not present

## 2014-07-29 DIAGNOSIS — Z79899 Other long term (current) drug therapy: Secondary | ICD-10-CM | POA: Insufficient documentation

## 2014-07-29 DIAGNOSIS — F329 Major depressive disorder, single episode, unspecified: Secondary | ICD-10-CM | POA: Diagnosis not present

## 2014-07-29 DIAGNOSIS — R112 Nausea with vomiting, unspecified: Secondary | ICD-10-CM | POA: Diagnosis not present

## 2014-07-29 DIAGNOSIS — Z87442 Personal history of urinary calculi: Secondary | ICD-10-CM | POA: Insufficient documentation

## 2014-07-29 DIAGNOSIS — E669 Obesity, unspecified: Secondary | ICD-10-CM | POA: Insufficient documentation

## 2014-07-29 DIAGNOSIS — Z8619 Personal history of other infectious and parasitic diseases: Secondary | ICD-10-CM | POA: Diagnosis not present

## 2014-07-29 DIAGNOSIS — Z3202 Encounter for pregnancy test, result negative: Secondary | ICD-10-CM | POA: Diagnosis not present

## 2014-07-29 LAB — COMPREHENSIVE METABOLIC PANEL
ALBUMIN: 3.5 g/dL (ref 3.5–5.2)
ALK PHOS: 78 U/L (ref 39–117)
ALT: 91 U/L — ABNORMAL HIGH (ref 0–35)
AST: 39 U/L — AB (ref 0–37)
Anion gap: 9 (ref 5–15)
BUN: 11 mg/dL (ref 6–23)
CHLORIDE: 108 mmol/L (ref 96–112)
CO2: 23 mmol/L (ref 19–32)
Calcium: 9.3 mg/dL (ref 8.4–10.5)
Creatinine, Ser: 0.61 mg/dL (ref 0.50–1.10)
GFR calc Af Amer: >90 mL/min (ref >90)
GFR calc non Af Amer: >90 mL/min (ref >90)
Glucose, Bld: 107 mg/dL — ABNORMAL HIGH (ref 70–99)
POTASSIUM: 3.6 mmol/L (ref 3.5–5.1)
SODIUM: 140 mmol/L (ref 135–145)
Total Bilirubin: 0.4 mg/dL (ref 0.3–1.2)
Total Protein: 7.6 g/dL (ref 6.0–8.3)

## 2014-07-29 LAB — URINALYSIS, ROUTINE W REFLEX MICROSCOPIC
BILIRUBIN URINE: NEGATIVE
Glucose, UA: NEGATIVE mg/dL
Ketones, ur: NEGATIVE mg/dL
Nitrite: NEGATIVE
PH: 5.5 (ref 5.0–8.0)
Protein, ur: 100 mg/dL — AB
SPECIFIC GRAVITY, URINE: 1.01 (ref 1.005–1.030)
UROBILINOGEN UA: 0.2 mg/dL (ref 0.0–1.0)

## 2014-07-29 LAB — CBC WITH DIFFERENTIAL/PLATELET
Basophils Absolute: 0 10*3/uL (ref 0.0–0.1)
Basophils Relative: 0 % (ref 0–1)
Eosinophils Absolute: 0.3 10*3/uL (ref 0.0–0.7)
Eosinophils Relative: 3 % (ref 0–5)
HCT: 38.1 % (ref 36.0–46.0)
Hemoglobin: 11.7 g/dL — ABNORMAL LOW (ref 12.0–15.0)
Lymphocytes Relative: 34 % (ref 12–46)
Lymphs Abs: 3.7 10*3/uL (ref 0.7–4.0)
MCH: 22.6 pg — ABNORMAL LOW (ref 26.0–34.0)
MCHC: 30.7 g/dL (ref 30.0–36.0)
MCV: 73.6 fL — AB (ref 78.0–100.0)
MONOS PCT: 6 % (ref 3–12)
Monocytes Absolute: 0.7 10*3/uL (ref 0.1–1.0)
NEUTROS PCT: 57 % (ref 43–77)
Neutro Abs: 6.2 10*3/uL (ref 1.7–7.7)
Platelets: 291 10*3/uL (ref 150–400)
RBC: 5.18 MIL/uL — ABNORMAL HIGH (ref 3.87–5.11)
RDW: 18.3 % — AB (ref 11.5–15.5)
WBC: 10.9 10*3/uL — ABNORMAL HIGH (ref 4.0–10.5)

## 2014-07-29 LAB — URINE MICROSCOPIC-ADD ON

## 2014-07-29 LAB — PREGNANCY, URINE: Preg Test, Ur: NEGATIVE

## 2014-07-29 LAB — LIPASE, BLOOD: Lipase: 22 U/L (ref 11–59)

## 2014-07-29 MED ORDER — OXYCODONE-ACETAMINOPHEN 5-325 MG PO TABS: 1.0000 | ORAL_TABLET | Freq: Four times a day (QID) | ORAL | Status: DC | PRN

## 2014-07-29 MED ORDER — HYDROMORPHONE HCL 1 MG/ML IJ SOLN
1.0000 mg | INTRAMUSCULAR | Status: AC
  Administered 2014-07-29: 1 mg via INTRAVENOUS
  Filled 2014-07-29: qty 1

## 2014-07-29 MED ORDER — ONDANSETRON HCL 4 MG/2ML IJ SOLN
4.0000 mg | Freq: Once | INTRAMUSCULAR | Status: AC
  Administered 2014-07-29: 4 mg via INTRAVENOUS
  Filled 2014-07-29: qty 2

## 2014-07-29 MED ORDER — HYDROMORPHONE HCL 1 MG/ML IJ SOLN
1.0000 mg | Freq: Once | INTRAMUSCULAR | Status: AC
  Administered 2014-07-29: 1 mg via INTRAVENOUS
  Filled 2014-07-29: qty 1

## 2014-07-29 MED ORDER — SODIUM CHLORIDE 0.9 % IV BOLUS (SEPSIS)
1000.0000 mL | INTRAVENOUS | Status: AC
  Administered 2014-07-29: 1000 mL via INTRAVENOUS

## 2014-07-29 MED ORDER — ONDANSETRON 4 MG PO TBDP: ORAL_TABLET | ORAL | Status: DC

## 2014-07-29 NOTE — Discharge Instructions (Signed)
Abdominal Pain, Women °Abdominal (stomach, pelvic, or belly) pain can be caused by many things. It is important to tell your doctor: °· The location of the pain. °· Does it come and go or is it present all the time? °· Are there things that start the pain (eating certain foods, exercise)? °· Are there other symptoms associated with the pain (fever, nausea, vomiting, diarrhea)? °All of this is helpful to know when trying to find the cause of the pain. °CAUSES  °· Stomach: virus or bacteria infection, or ulcer. °· Intestine: appendicitis (inflamed appendix), regional ileitis (Crohn's disease), ulcerative colitis (inflamed colon), irritable bowel syndrome, diverticulitis (inflamed diverticulum of the colon), or cancer of the stomach or intestine. °· Gallbladder disease or stones in the gallbladder. °· Kidney disease, kidney stones, or infection. °· Pancreas infection or cancer. °· Fibromyalgia (pain disorder). °· Diseases of the female organs: °¨ Uterus: fibroid (non-cancerous) tumors or infection. °¨ Fallopian tubes: infection or tubal pregnancy. °¨ Ovary: cysts or tumors. °¨ Pelvic adhesions (scar tissue). °¨ Endometriosis (uterus lining tissue growing in the pelvis and on the pelvic organs). °¨ Pelvic congestion syndrome (female organs filling up with blood just before the menstrual period). °¨ Pain with the menstrual period. °¨ Pain with ovulation (producing an egg). °¨ Pain with an IUD (intrauterine device, birth control) in the uterus. °¨ Cancer of the female organs. °· Functional pain (pain not caused by a disease, may improve without treatment). °· Psychological pain. °· Depression. °DIAGNOSIS  °Your doctor will decide the seriousness of your pain by doing an examination. °· Blood tests. °· X-rays. °· Ultrasound. °· CT scan (computed tomography, special type of X-ray). °· MRI (magnetic resonance imaging). °· Cultures, for infection. °· Barium enema (dye inserted in the large intestine, to better view it with  X-rays). °· Colonoscopy (looking in intestine with a lighted tube). °· Laparoscopy (minor surgery, looking in abdomen with a lighted tube). °· Major abdominal exploratory surgery (looking in abdomen with a large incision). °TREATMENT  °The treatment will depend on the cause of the pain.  °· Many cases can be observed and treated at home. °· Over-the-counter medicines recommended by your caregiver. °· Prescription medicine. °· Antibiotics, for infection. °· Birth control pills, for painful periods or for ovulation pain. °· Hormone treatment, for endometriosis. °· Nerve blocking injections. °· Physical therapy. °· Antidepressants. °· Counseling with a psychologist or psychiatrist. °· Minor or major surgery. °HOME CARE INSTRUCTIONS  °· Do not take laxatives, unless directed by your caregiver. °· Take over-the-counter pain medicine only if ordered by your caregiver. Do not take aspirin because it can cause an upset stomach or bleeding. °· Try a clear liquid diet (broth or water) as ordered by your caregiver. Slowly move to a bland diet, as tolerated, if the pain is related to the stomach or intestine. °· Have a thermometer and take your temperature several times a day, and record it. °· Bed rest and sleep, if it helps the pain. °· Avoid sexual intercourse, if it causes pain. °· Avoid stressful situations. °· Keep your follow-up appointments and tests, as your caregiver orders. °· If the pain does not go away with medicine or surgery, you may try: °¨ Acupuncture. °¨ Relaxation exercises (yoga, meditation). °¨ Group therapy. °¨ Counseling. °SEEK MEDICAL CARE IF:  °· You notice certain foods cause stomach pain. °· Your home care treatment is not helping your pain. °· You need stronger pain medicine. °· You want your IUD removed. °· You feel faint or   lightheaded. °· You develop nausea and vomiting. °· You develop a rash. °· You are having side effects or an allergy to your medicine. °SEEK IMMEDIATE MEDICAL CARE IF:  °· Your  pain does not go away or gets worse. °· You have a fever. °· Your pain is felt only in portions of the abdomen. The right side could possibly be appendicitis. The left lower portion of the abdomen could be colitis or diverticulitis. °· You are passing blood in your stools (bright red or black tarry stools, with or without vomiting). °· You have blood in your urine. °· You develop chills, with or without a fever. °· You pass out. °MAKE SURE YOU:  °· Understand these instructions. °· Will watch your condition. °· Will get help right away if you are not doing well or get worse. °Document Released: 03/25/2007 Document Revised: 10/12/2013 Document Reviewed: 04/14/2009 °[employer]® Patient Information ©2015 [employer]. This information is not intended to replace advice given to you by your health care provider. Make sure you discuss any questions you have with your health care provider. ° °

## 2014-07-29 NOTE — ED Provider Notes (Signed)
CSN: [id number]     Arrival date & time 07/29/14  1839 History   First MD Initiated Contact with Patient 07/29/14 2013     Chief Complaint  Patient presents with  . Flank Pain  . Abdominal Pain  . Nausea  . Emesis     (Consider location/radiation/quality/duration/timing/severity/associated sxs/prior Treatment) Patient is a 29 y.o. female presenting with flank pain, abdominal pain, and vomiting. The history is provided by the patient.  Flank Pain This is a recurrent problem. The current episode started more than 2 days ago. The problem occurs constantly. The problem has been rapidly worsening. Associated symptoms include abdominal pain. Pertinent negatives include no chest pain, no headaches and no shortness of breath. Nothing aggravates the symptoms. Nothing relieves the symptoms. She has tried nothing for the symptoms. The treatment provided no relief.  Abdominal Pain Associated symptoms: fever, hematuria, nausea and vomiting   Associated symptoms: no chest pain, no cough, no diarrhea, no dysuria, no fatigue and no shortness of breath   Emesis Associated symptoms: abdominal pain   Associated symptoms: no diarrhea and no headaches     Past Medical History  Diagnosis Date  . Depression   . Bipolar 1 disorder, depressed   . PTSD (post-traumatic stress disorder)   . Kidney stone   . Migraine   . Urinary tract infection   . Pregnancy induced hypertension   . Obesity   . UTI (lower urinary tract infection)   . Diabetes mellitus without complication   . Hep C w/o coma, chronic    Past Surgical History  Procedure Laterality Date  . Kidney stones    . Lipotripsy     Family History  Problem Relation Age of Onset  . Rheum arthritis Mother   . Asthma Mother   . Hypertension Mother   . Other Neg Hx   . CAD Other    History  Substance Use Topics  . Smoking status: Current Every Day Smoker -- 1.00 packs/day    Types: Cigarettes  . Smokeless tobacco: Never Used  . Alcohol  Use: No   OB History    Gravida Para Term Preterm AB TAB SAB Ectopic Multiple Living   Review of Systems  Constitutional: Positive for fever. Negative for fatigue.  HENT: Negative for congestion and drooling.   Eyes: Negative for pain.  Respiratory: Negative for cough and shortness of breath.   Cardiovascular: Negative for chest pain.  Gastrointestinal: Positive for nausea, vomiting and abdominal pain. Negative for diarrhea.  Genitourinary: Positive for hematuria and flank pain. Negative for dysuria.  Musculoskeletal: Negative for back pain, gait problem and neck pain.  Skin: Negative for color change.  Neurological: Negative for dizziness and headaches.  Hematological: Negative for adenopathy.  Psychiatric/Behavioral: Negative for behavioral problems.  All other systems reviewed and are negative.     Allergies  Darvocet; Morphine and related; Sulfa antibiotics; Tomato; and Pioglitazone  Home Medications   Prior to Admission medications   Medication Sig Start Date End Date Taking? Authorizing Provider  ALPRAZolam Prudy Feeler) 1 MG tablet Take 1 mg by mouth 3 (three) times daily as needed for anxiety (anxiety).    Yes Historical Provider, MD  butalbital-acetaminophen-caffeine (FIORICET, ESGIC) 50-325-40 MG per tablet Take 1 tablet by mouth as needed for headache or migraine (headache).    Yes Historical Provider, MD  glimepiride (AMARYL) 4 MG tablet Take 4 mg by mouth daily with breakfast.  Yes Historical Provider, MD  HYDROcodone-acetaminophen (NORCO) 10-325 MG per tablet Take 1 tablet by mouth 3 (three) times daily as needed (pain).   Yes Historical Provider, MD  ibuprofen (ADVIL,MOTRIN) 800 MG tablet Take 800 mg by mouth every 8 (eight) hours as needed for moderate pain (swelling).   Yes Historical Provider, MD  Omega-3 Fatty Acids (FISH OIL PO) Take 1 capsule by mouth 3 (three) times daily.   Yes Historical Provider, MD  Prenatal Vit-Fe Fumarate-FA (PRENATAL  MULTIVITAMIN) TABS tablet Take 1 tablet by mouth every morning.    Yes Historical Provider, MD  oxyCODONE-acetaminophen (PERCOCET) 5-325 MG per tablet Take 1-2 tablets by mouth every 4 (four) hours as needed. Patient not taking: Reported on 07/29/2014 10/13/13   Arthor Captain, PA-C   BP 162/92 mmHg  Pulse 99  Temp(Src) 98 F (36.7 C) (Oral)  Resp 17  SpO2 99% Physical Exam  Constitutional: She is oriented to person, place, and time. She appears well-developed and well-nourished.  HENT:  Head: Normocephalic.  Mouth/Throat: Oropharynx is clear and moist. No oropharyngeal exudate.  Eyes: Conjunctivae and EOM are normal. Pupils are equal, round, and reactive to light.  Neck: Normal range of motion. Neck supple.  Cardiovascular: Normal rate, regular rhythm, normal heart sounds and intact distal pulses.  Exam reveals no gallop and no friction rub.   No murmur heard. Pulmonary/Chest: Effort normal and breath sounds normal. No respiratory distress. She has no wheezes.  Abdominal: Soft. Bowel sounds are normal. There is no tenderness. There is no rebound and no guarding.  Musculoskeletal: Normal range of motion. She exhibits no edema or tenderness.  Neurological: She is alert and oriented to person, place, and time.  Skin: Skin is warm and dry.  Psychiatric: She has a normal mood and affect. Her behavior is normal.  Nursing note and vitals reviewed.   ED Course  Procedures (including critical care time) Labs Review Labs Reviewed  COMPREHENSIVE METABOLIC PANEL - Abnormal; Notable for the following:    Glucose, Bld 107 (*)    AST 39 (*)    ALT 91 (*)    All other components within normal limits  CBC WITH DIFFERENTIAL/PLATELET - Abnormal; Notable for the following:    WBC 10.9 (*)    RBC 5.18 (*)    Hemoglobin 11.7 (*)    MCV 73.6 (*)    MCH 22.6 (*)    RDW 18.3 (*)    All other components within normal limits  URINALYSIS, ROUTINE W REFLEX MICROSCOPIC - Abnormal; Notable for the  following:    Color, Urine RED (*)    APPearance CLOUDY (*)    Hgb urine dipstick LARGE (*)    Protein, ur 100 (*)    Leukocytes, UA TRACE (*)    All other components within normal limits  URINE MICROSCOPIC-ADD ON - Abnormal; Notable for the following:    Squamous Epithelial / LPF FEW (*)    Casts HYALINE CASTS (*)    All other components within normal limits  LIPASE, BLOOD  PREGNANCY, URINE    Imaging Review Ct Renal Stone Study  07/29/2014   CLINICAL DATA:  Right flank pain, 1 week duration.  EXAM: CT ABDOMEN AND PELVIS WITHOUT CONTRAST  TECHNIQUE: Multidetector CT imaging of the abdomen and pelvis was performed following the standard protocol without IV contrast.  COMPARISON:  None.  FINDINGS: There are no urinary calculi. There is no hydronephrosis or ureteral dilatation. There are unremarkable unenhanced appearances of the liver, spleen, pancreas and adrenals.  There are normal appearances of the stomach, small bowel and colon. The appendix is normal. No significant abnormalities are evident in the lower chest. No significant musculoskeletal abnormalities are evident.  No acute inflammatory changes are evident in the abdomen or pelvis.  IMPRESSION: No significant abnormality   Electronically Signed   By: Ellery Plunkaniel R Mitchell M.D.   On: 07/29/2014 22:59     EKG Interpretation None      MDM   Final diagnoses:  Right flank pain    9:14 PM 29 y.o. female w hx of migraines, DM, hep c who presents with right flank pain for the last week. She notes acute worsening over the last few days with development of fever and vomiting. She is also had some hematuria. She states that she has a history of kidney stones and this feels similar. She is afebrile and vital signs are unremarkable here. We'll get screening labs and imaging.  11:10 PM: I interpreted/reviewed the labs and/or imaging which were non-contributory.  UA w/ blood, pt denies being on menstrual cycle. Possibly passed stone.  I have  discussed the diagnosis/risks/treatment options with the patient and believe the pt to be eligible for discharge home to follow-up with her pcp. We also discussed returning to the ED immediately if new or worsening sx occur. We discussed the sx which are most concerning (e.g., worsening pain, fever) that necessitate immediate return. Medications administered to the patient during their visit and any new prescriptions provided to the patient are listed below.  Medications given during this visit Medications  sodium chloride 0.9 % bolus 1,000 mL (1,000 mLs Intravenous New Bag/Given 07/29/14 2108)  HYDROmorphone (DILAUDID) injection 1 mg (1 mg Intravenous Given 07/29/14 2108)  ondansetron (ZOFRAN) injection 4 mg (4 mg Intravenous Given 07/29/14 2108)  HYDROmorphone (DILAUDID) injection 1 mg (1 mg Intravenous Given 07/29/14 2235)    Discharge Medication List as of 07/29/2014 11:12 PM    START taking these medications   Details  ondansetron (ZOFRAN ODT) 4 MG disintegrating tablet 4mg  ODT q4 hours prn nausea/vomit, Print         Purvis SheffieldForrest Bellamy Rubey, MD 07/30/14 1120

## 2014-07-29 NOTE — ED Notes (Addendum)
Pt c/o severe right flank pain radiating to right abdomen. T max 102.3 today. Took Ibuprofen around 1200 (also took Xanax, Norco). Has had hx kidney stones. Emesis x2 days. C/o headaches and hematuria. Denies diarrhea. Has had difficulty urinating. Denies burning. No other c/c.

## 2014-08-03 ENCOUNTER — Emergency Department (HOSPITAL_COMMUNITY): Payer: Medicaid Other

## 2014-08-03 ENCOUNTER — Emergency Department (HOSPITAL_COMMUNITY)
Admission: EM | Admit: 2014-08-03 | Discharge: 2014-08-03 | Disposition: A | Discharge status: Home / Self Care | Payer: Medicaid Other | Attending: Emergency Medicine | Admitting: Emergency Medicine

## 2014-08-03 ENCOUNTER — Encounter (HOSPITAL_COMMUNITY): Payer: Self-pay

## 2014-08-03 DIAGNOSIS — R1110 Vomiting, unspecified: Secondary | ICD-10-CM | POA: Diagnosis not present

## 2014-08-03 DIAGNOSIS — Z8679 Personal history of other diseases of the circulatory system: Secondary | ICD-10-CM | POA: Diagnosis not present

## 2014-08-03 DIAGNOSIS — Z8619 Personal history of other infectious and parasitic diseases: Secondary | ICD-10-CM | POA: Diagnosis not present

## 2014-08-03 DIAGNOSIS — R109 Unspecified abdominal pain: Secondary | ICD-10-CM | POA: Diagnosis not present

## 2014-08-03 DIAGNOSIS — Z87442 Personal history of urinary calculi: Secondary | ICD-10-CM | POA: Diagnosis not present

## 2014-08-03 DIAGNOSIS — Z87440 Personal history of urinary (tract) infections: Secondary | ICD-10-CM | POA: Diagnosis not present

## 2014-08-03 DIAGNOSIS — E669 Obesity, unspecified: Secondary | ICD-10-CM | POA: Insufficient documentation

## 2014-08-03 DIAGNOSIS — R52 Pain, unspecified: Secondary | ICD-10-CM

## 2014-08-03 DIAGNOSIS — F329 Major depressive disorder, single episode, unspecified: Secondary | ICD-10-CM | POA: Diagnosis not present

## 2014-08-03 DIAGNOSIS — Z79899 Other long term (current) drug therapy: Secondary | ICD-10-CM | POA: Diagnosis not present

## 2014-08-03 DIAGNOSIS — E119 Type 2 diabetes mellitus without complications: Secondary | ICD-10-CM | POA: Diagnosis not present

## 2014-08-03 DIAGNOSIS — Z720 Tobacco use: Secondary | ICD-10-CM | POA: Diagnosis not present

## 2014-08-03 DIAGNOSIS — R319 Hematuria, unspecified: Secondary | ICD-10-CM | POA: Diagnosis present

## 2014-08-03 LAB — CBC WITH DIFFERENTIAL/PLATELET
Basophils Absolute: 0 10*3/uL (ref 0.0–0.1)
Basophils Relative: 0 % (ref 0–1)
EOS PCT: 4 % (ref 0–5)
Eosinophils Absolute: 0.2 10*3/uL (ref 0.0–0.7)
HCT: 35.8 % — ABNORMAL LOW (ref 36.0–46.0)
HEMOGLOBIN: 10.9 g/dL — AB (ref 12.0–15.0)
LYMPHS ABS: 2 10*3/uL (ref 0.7–4.0)
Lymphocytes Relative: 33 % (ref 12–46)
MCH: 22.5 pg — AB (ref 26.0–34.0)
MCHC: 30.4 g/dL (ref 30.0–36.0)
MCV: 74 fL — ABNORMAL LOW (ref 78.0–100.0)
Monocytes Absolute: 0.4 10*3/uL (ref 0.1–1.0)
Monocytes Relative: 7 % (ref 3–12)
Neutro Abs: 3.5 10*3/uL (ref 1.7–7.7)
Neutrophils Relative %: 56 % (ref 43–77)
PLATELETS: 266 10*3/uL (ref 150–400)
RBC: 4.84 MIL/uL (ref 3.87–5.11)
RDW: 18.2 % — ABNORMAL HIGH (ref 11.5–15.5)
WBC: 6.1 10*3/uL (ref 4.0–10.5)

## 2014-08-03 LAB — COMPREHENSIVE METABOLIC PANEL
ALK PHOS: 77 U/L (ref 39–117)
ALT: 46 U/L — AB (ref 0–35)
AST: 28 U/L (ref 0–37)
Albumin: 3.3 g/dL — ABNORMAL LOW (ref 3.5–5.2)
Anion gap: 7 (ref 5–15)
BUN: 9 mg/dL (ref 6–23)
CHLORIDE: 105 mmol/L (ref 96–112)
CO2: 26 mmol/L (ref 19–32)
Calcium: 8.9 mg/dL (ref 8.4–10.5)
Creatinine, Ser: 0.55 mg/dL (ref 0.50–1.10)
GFR calc Af Amer: >90 mL/min (ref >90)
GLUCOSE: 109 mg/dL — AB (ref 70–99)
POTASSIUM: 4 mmol/L (ref 3.5–5.1)
SODIUM: 138 mmol/L (ref 135–145)
TOTAL PROTEIN: 7 g/dL (ref 6.0–8.3)
Total Bilirubin: 0.2 mg/dL — ABNORMAL LOW (ref 0.3–1.2)

## 2014-08-03 LAB — URINALYSIS, ROUTINE W REFLEX MICROSCOPIC
Bilirubin Urine: NEGATIVE
Glucose, UA: NEGATIVE mg/dL
KETONES UR: NEGATIVE mg/dL
NITRITE: NEGATIVE
Protein, ur: NEGATIVE mg/dL
SPECIFIC GRAVITY, URINE: 1.01 (ref 1.005–1.030)
Urobilinogen, UA: 1 mg/dL (ref 0.0–1.0)
pH: 6.5 (ref 5.0–8.0)

## 2014-08-03 LAB — URINE MICROSCOPIC-ADD ON

## 2014-08-03 LAB — LIPASE, BLOOD: Lipase: 21 U/L (ref 11–59)

## 2014-08-03 MED ORDER — ONDANSETRON HCL 4 MG/2ML IJ SOLN
4.0000 mg | Freq: Once | INTRAMUSCULAR | Status: AC
  Administered 2014-08-03: 4 mg via INTRAVENOUS
  Filled 2014-08-03: qty 2

## 2014-08-03 MED ORDER — SODIUM CHLORIDE 0.9 % IV BOLUS (SEPSIS)
1000.0000 mL | Freq: Once | INTRAVENOUS | Status: AC
  Administered 2014-08-03: 1000 mL via INTRAVENOUS

## 2014-08-03 MED ORDER — HYDROMORPHONE HCL 1 MG/ML IJ SOLN
1.0000 mg | Freq: Once | INTRAMUSCULAR | Status: AC
  Administered 2014-08-03: 1 mg via INTRAVENOUS
  Filled 2014-08-03: qty 1

## 2014-08-03 MED ORDER — HYDROMORPHONE HCL 1 MG/ML IJ SOLN
INTRAMUSCULAR | Status: AC
  Filled 2014-08-03: qty 1

## 2014-08-03 MED ORDER — OXYCODONE-ACETAMINOPHEN 5-325 MG PO TABS
1.0000 | ORAL_TABLET | Freq: Once | ORAL | Status: AC
  Administered 2014-08-03: 1 via ORAL
  Filled 2014-08-03: qty 1

## 2014-08-03 MED ORDER — HYDROMORPHONE HCL 1 MG/ML IJ SOLN
0.5000 mg | Freq: Once | INTRAMUSCULAR | Status: AC
  Administered 2014-08-03: 0.5 mg via INTRAVENOUS
  Filled 2014-08-03: qty 1

## 2014-08-03 MED ORDER — OXYCODONE-ACETAMINOPHEN 5-325 MG PO TABS: 2.0000 | ORAL_TABLET | Freq: Four times a day (QID) | ORAL | Status: DC | PRN

## 2014-08-03 MED ORDER — LORAZEPAM 2 MG/ML IJ SOLN
1.0000 mg | Freq: Once | INTRAMUSCULAR | Status: AC
  Administered 2014-08-03: 1 mg via INTRAVENOUS
  Filled 2014-08-03: qty 1

## 2014-08-03 MED ORDER — PANTOPRAZOLE SODIUM 20 MG PO TBEC: 20.0000 mg | DELAYED_RELEASE_TABLET | Freq: Every day | ORAL | Status: DC

## 2014-08-03 MED ORDER — ONDANSETRON HCL 4 MG PO TABS: 4.0000 mg | ORAL_TABLET | Freq: Four times a day (QID) | ORAL | Status: DC

## 2014-08-03 NOTE — ED Provider Notes (Signed)
CSN: [id number]     Arrival date & time 08/03/14  [id number] History   First MD Initiated Contact with Patient 08/03/14 0757     Chief Complaint  Patient presents with  . Hematuria  . Emesis     (Consider location/radiation/quality/duration/timing/severity/associated sxs/prior Treatment) Patient is a 29 y.o. female presenting with hematuria and vomiting. The history is provided by the patient (pt complains of abd pain).  Hematuria This is a recurrent problem. The current episode started more than 2 days ago. The problem occurs constantly. The problem has not changed since onset.Associated symptoms include abdominal pain. Pertinent negatives include no chest pain and no headaches. Nothing aggravates the symptoms. Nothing relieves the symptoms.  Emesis Associated symptoms: abdominal pain   Associated symptoms: no diarrhea and no headaches     Past Medical History  Diagnosis Date  . Depression   . Bipolar 1 disorder, depressed   . PTSD (post-traumatic stress disorder)   . Kidney stone   . Migraine   . Urinary tract infection   . Pregnancy induced hypertension   . Obesity   . UTI (lower urinary tract infection)   . Diabetes mellitus without complication   . Hep C w/o coma, chronic    Past Surgical History  Procedure Laterality Date  . Kidney stones    . Lipotripsy     Family History  Problem Relation Age of Onset  . Rheum arthritis Mother   . Asthma Mother   . Hypertension Mother   . Other Neg Hx   . CAD Other    History  Substance Use Topics  . Smoking status: Current Every Day Smoker -- 1.00 packs/day    Types: Cigarettes  . Smokeless tobacco: Never Used  . Alcohol Use: No   OB History    Gravida Para Term Preterm AB TAB SAB Ectopic Multiple Living   3 2 2  1  1   2      Review of Systems  Constitutional: Negative for appetite change and fatigue.  HENT: Negative for congestion, ear discharge and sinus pressure.   Eyes: Negative for discharge.  Respiratory:  Negative for cough.   Cardiovascular: Negative for chest pain.  Gastrointestinal: Positive for vomiting and abdominal pain. Negative for diarrhea.  Genitourinary: Positive for hematuria. Negative for frequency.  Musculoskeletal: Negative for back pain.  Skin: Negative for rash.  Neurological: Negative for seizures and headaches.  Psychiatric/Behavioral: Negative for hallucinations.      Allergies  Darvocet; Morphine and related; Sulfa antibiotics; Tomato; and Pioglitazone  Home Medications   Prior to Admission medications   Medication Sig Start Date End Date Taking? Authorizing Provider  ALPRAZolam Prudy Feeler(XANAX) 1 MG tablet Take 1 mg by mouth 3 (three) times daily as needed for anxiety (anxiety).    Yes Historical Provider, MD  butalbital-acetaminophen-caffeine (FIORICET, ESGIC) 50-325-40 MG per tablet Take 1 tablet by mouth as needed for headache or migraine (headache).    Yes Historical Provider, MD  glimepiride (AMARYL) 4 MG tablet Take 4 mg by mouth daily with breakfast.   Yes Historical Provider, MD  HYDROcodone-acetaminophen (NORCO) 10-325 MG per tablet Take 1 tablet by mouth 3 (three) times daily as needed (pain).   Yes Historical Provider, MD  ibuprofen (ADVIL,MOTRIN) 800 MG tablet Take 800 mg by mouth every 8 (eight) hours as needed for moderate pain (swelling).   Yes Historical Provider, MD  Omega-3 Fatty Acids (FISH OIL PO) Take 1 capsule by mouth 3 (three) times daily.   Yes Historical Provider, MD  ondansetron (ZOFRAN ODT) 4 MG disintegrating tablet  ODT q4 hours prn nausea/vomit 07/29/14  Yes Purvis Sheffield, MD  Prenatal Vit-Fe Fumarate-FA (PRENATAL MULTIVITAMIN) TABS tablet Take 1 tablet by mouth every morning.    Yes Historical Provider, MD  ondansetron (ZOFRAN) 4 MG tablet Take 1 tablet (4 mg total) by mouth every 6 (six) hours. 08/03/14   Benny Lennert, MD  oxyCODONE-acetaminophen (PERCOCET) 5-325 MG per tablet Take 2 tablets by mouth every 6 (six) hours as needed.  08/03/14   Benny Lennert, MD  pantoprazole (PROTONIX) 20 MG tablet Take 1 tablet (20 mg total) by mouth daily. 08/03/14   Benny Lennert, MD   BP 117/69 mmHg  Pulse 74  Temp(Src) 97.7 F (36.5 C) (Oral)  Resp 18  SpO2 99%  LMP 07/06/2014 Physical Exam  Constitutional: She is oriented to person, place, and time. She appears well-developed.  HENT:  Head: Normocephalic.  Eyes: Conjunctivae and EOM are normal. No scleral icterus.  Neck: Neck supple. No thyromegaly present.  Cardiovascular: Normal rate and regular rhythm.  Exam reveals no gallop and no friction rub.   No murmur heard. Pulmonary/Chest: No stridor. She has no wheezes. She has no rales. She exhibits no tenderness.  Abdominal: She exhibits no distension. There is tenderness. There is no rebound.  Moderate abd tenderness  Musculoskeletal: Normal range of motion. She exhibits no edema.  Lymphadenopathy:    She has no cervical adenopathy.  Neurological: She is oriented to person, place, and time. She exhibits normal muscle tone. Coordination normal.  Skin: No rash noted. No erythema.  Psychiatric: She has a normal mood and affect. Her behavior is normal.    ED Course  Procedures (including critical care time) Labs Review Labs Reviewed  CBC WITH DIFFERENTIAL/PLATELET - Abnormal; Notable for the following:    Hemoglobin 10.9 (*)    HCT 35.8 (*)    MCV 74.0 (*)    MCH 22.5 (*)    RDW 18.2 (*)    All other components within normal limits  COMPREHENSIVE METABOLIC PANEL - Abnormal; Notable for the following:    Glucose, Bld 109 (*)    Albumin 3.3 (*)    ALT 46 (*)    Total Bilirubin 0.2 (*)    All other components within normal limits  URINALYSIS, ROUTINE W REFLEX MICROSCOPIC - Abnormal; Notable for the following:    APPearance CLOUDY (*)    Hgb urine dipstick LARGE (*)    Leukocytes, UA TRACE (*)    All other components within normal limits  URINE MICROSCOPIC-ADD ON - Abnormal; Notable for the following:     Squamous Epithelial / LPF FEW (*)    All other components within normal limits  URINE CULTURE  LIPASE, BLOOD    Imaging Review US Abdomen Complete  08/03/2014   CLINICAL DATA:  Right upper abdominal pain x1 month.  Obesity.  EXAM: COMPLETE ABDOMINAL ULTRASOUND  COMPARISON:  CT 07/29/2014  FINDINGS: Gallbladder: Physiologically distended without stones, wall thickening, or pericholecystic fluid. Sonographer reports no sonographic Murphy's sign.  Common bile duct:  Normal in caliber, 5.9  mm diameter.  Liver: Echogenic without focal lesion or intrahepatic bile duct dilatation.  IVC: Visualized segments unremarkable, exam limited by body habitus, portions obscured by overlying bowel gas.  Pancreas: Visualized segments unremarkable, exam limited by body habitus, portions obscured by overlying bowel gas.  Spleen:  No focal lesion, craniocaudal 12.2cm in length.  Right Kidney:  No mass or hydronephrosis, 14.0cm in length.  Left  Kidney:  No lesion or hydronephrosis, 14.1cm in length.  Abdominal aorta: Visualized segments unremarkable, exam limited by body habitus, portions obscured by overlying bowel gas.  IMPRESSION: Negative.  Normal gallbladder.   Electronically Signed   By: Corlis Leak M.D.   On: 08/03/2014 11:41     EKG Interpretation None      MDM   Final diagnoses:  Pain  Abdominal pain in female    abd pain,  tx with protonix and follow up   Benny Lennert, MD 08/03/14 1205

## 2014-08-03 NOTE — ED Notes (Signed)
Pt presents with c/o emesis and hematuria since last Thursday. Pt recently seen for same, reports her symptoms have gotten worse. Ambulatory to triage.

## 2014-08-03 NOTE — Discharge Instructions (Signed)
Follow up with your md in one week 

## 2014-08-04 LAB — URINE CULTURE
CULTURE: NO GROWTH
Colony Count: NO GROWTH

## 2014-08-22 IMAGING — CT CT HEAD W/O CM
3 of 5 series · 17 of 30 positions shown, 19 images · non-contrast
Comparison: CT NECK W/CM dated 11/14/2011

CLINICAL DATA: Worsening headache, neck pain, photophobia.

EXAM:
CT HEAD WITHOUT CONTRAST
CT CERVICAL SPINE WITHOUT CONTRAST
TECHNIQUE: Multidetector CT imaging of the head and cervical spine was
performed following the standard protocol without intravenous
contrast. Multiplanar CT image reconstructions of the cervical spine
were also generated.

[Series 4: bone windows · axial · 0.44mm/px · z∈[-156,-106]mm · 2 of 30 slices shown]
[im 10/30  bone]
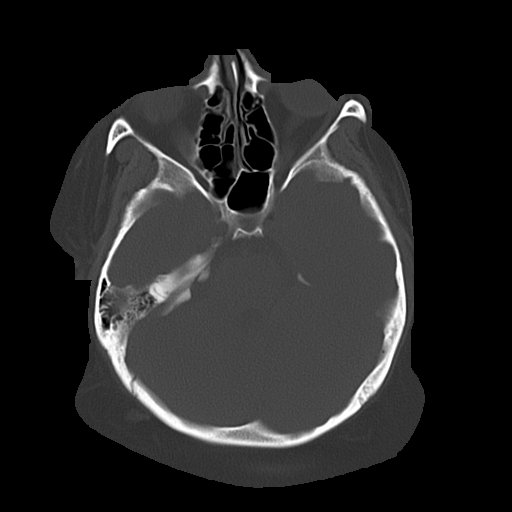
[im 20/30  bone]
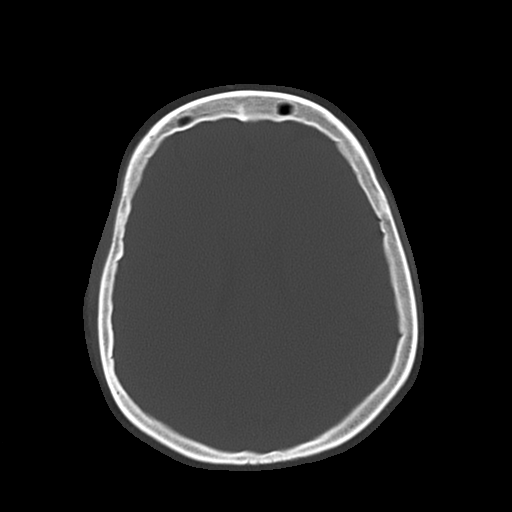

[Series 5: c-spine st · axial · 0.25mm/px · z∈[-332,-224]mm · 7 of 82 slices shown]
[im 10/82  brain]
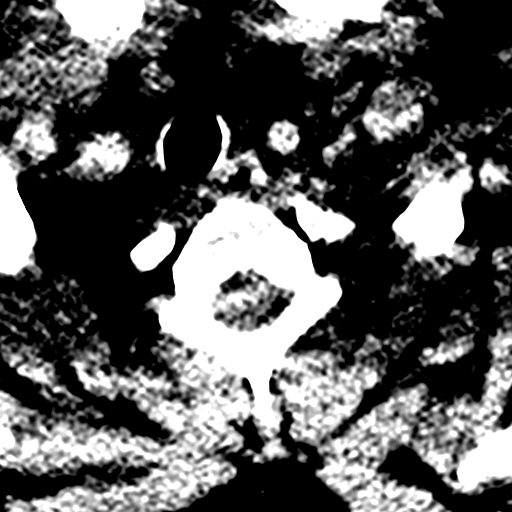
[im 19/82  brain]
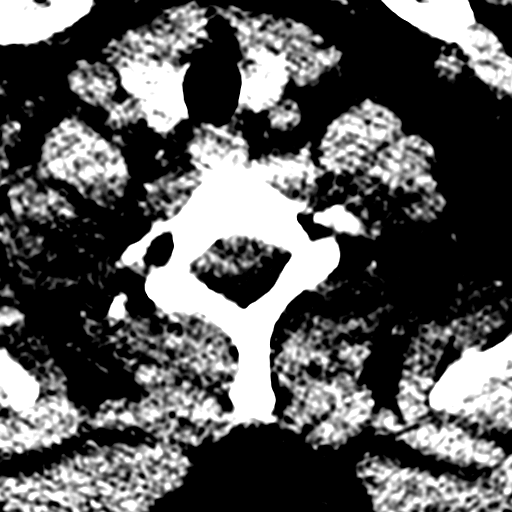
[im 28/82  brain]
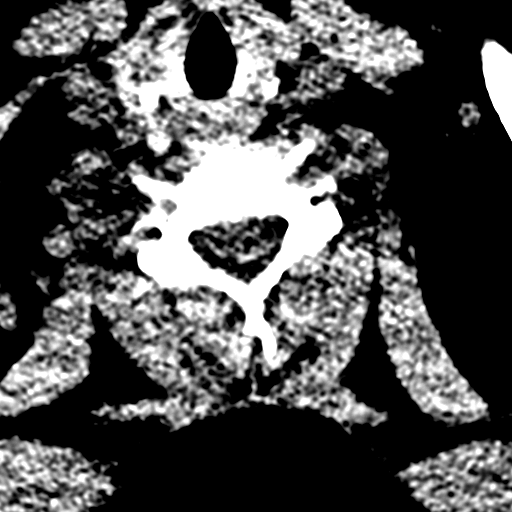
[im 37/82  brain]
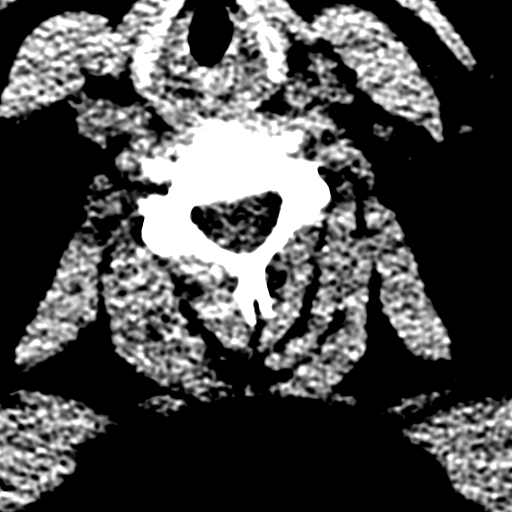
[im 46/82  brain]
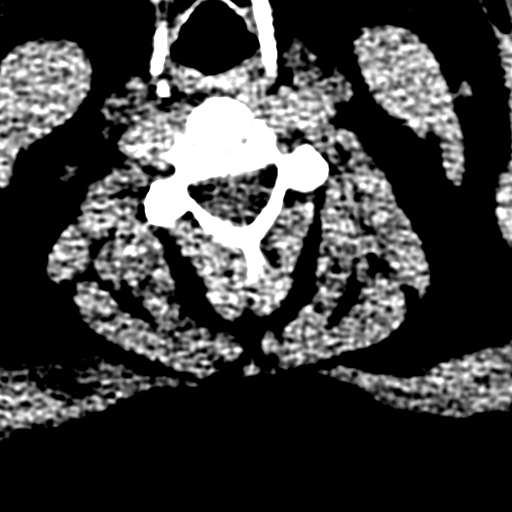
[im 55/82  brain]
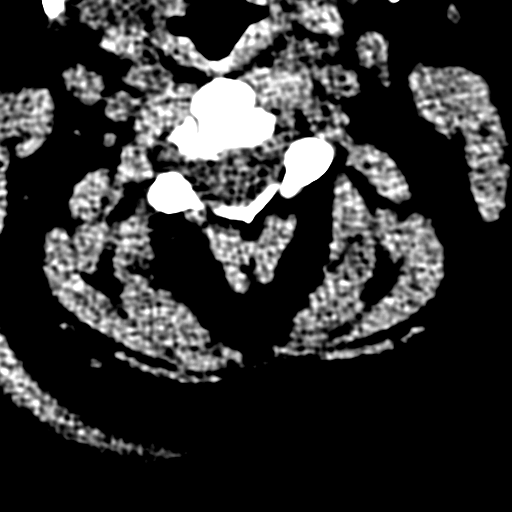
[im 64/82  brain]
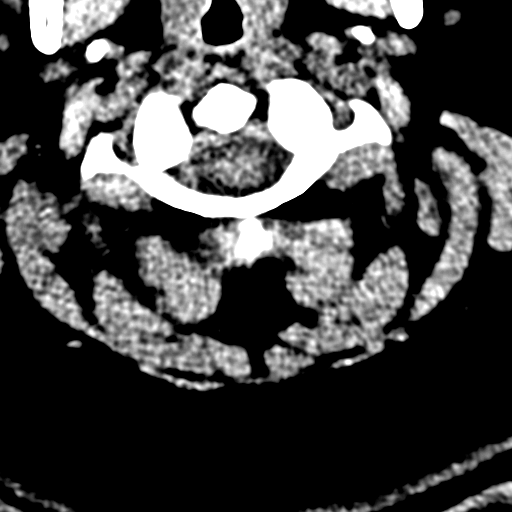

[Series 7: axial recon · axial · 0.23mm/px · z∈[-346,-223]mm · 8 of 81 slices shown, 10 images]
[im 9/81  brain]
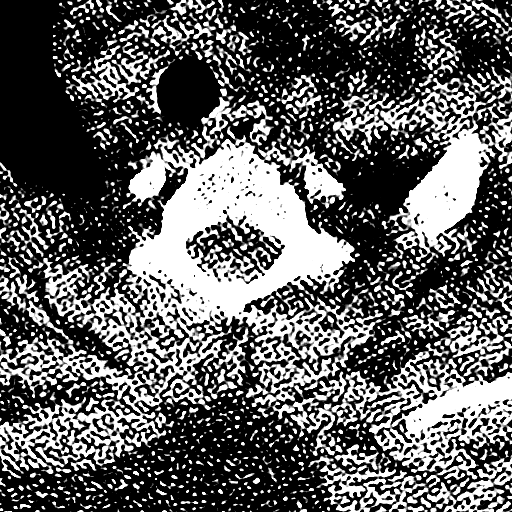
[im 9/81  bone]
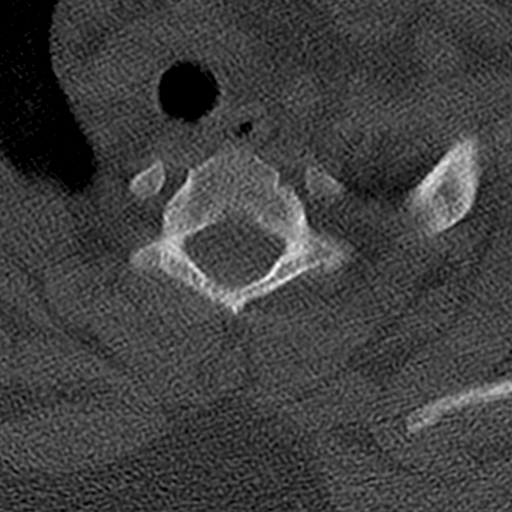
[im 18/81  brain]
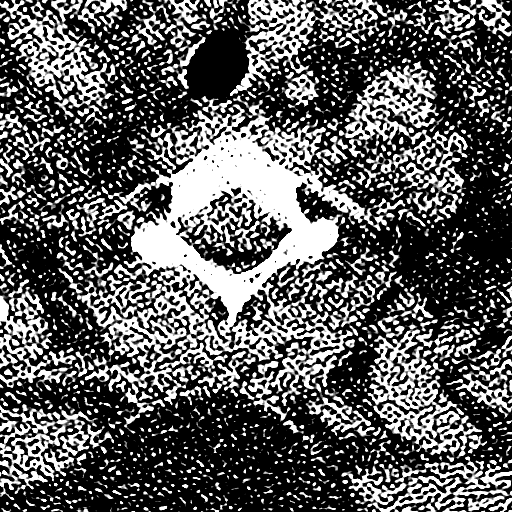
[im 27/81  brain]
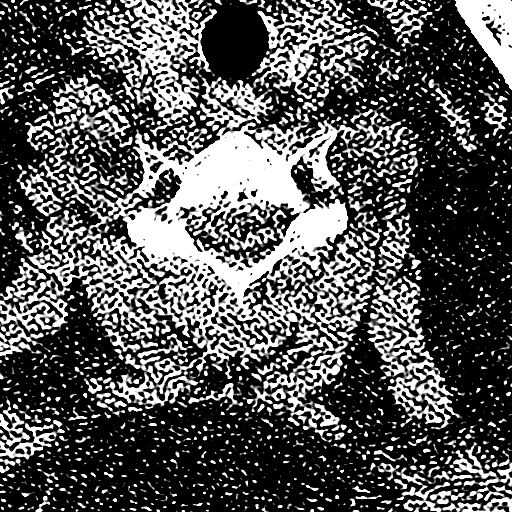
[im 36/81  brain]
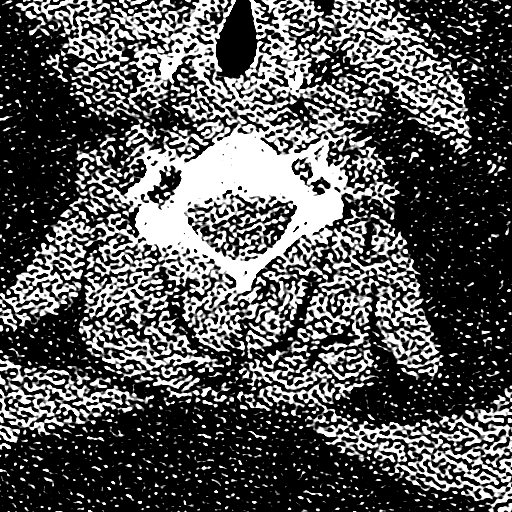
[im 45/81  brain]
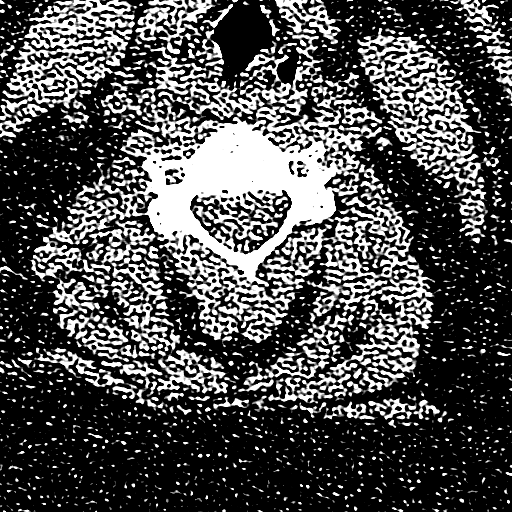
[im 45/81  bone]
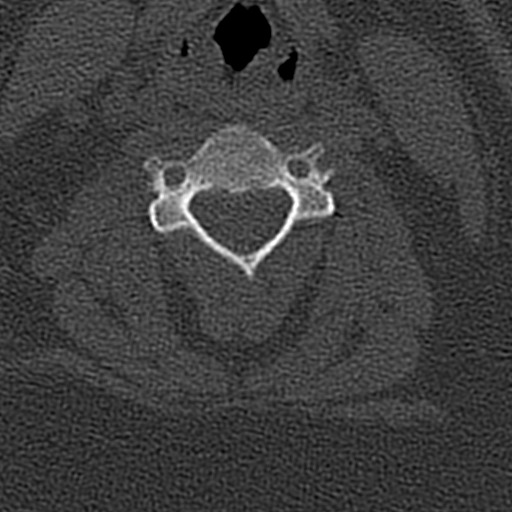
[im 54/81  brain]
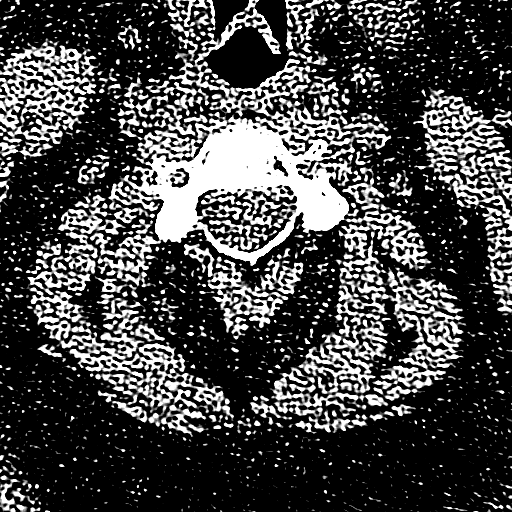
[im 63/81  brain]
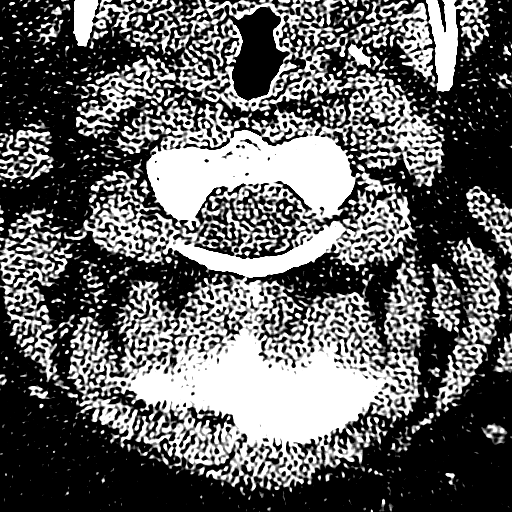
[im 72/81  brain]
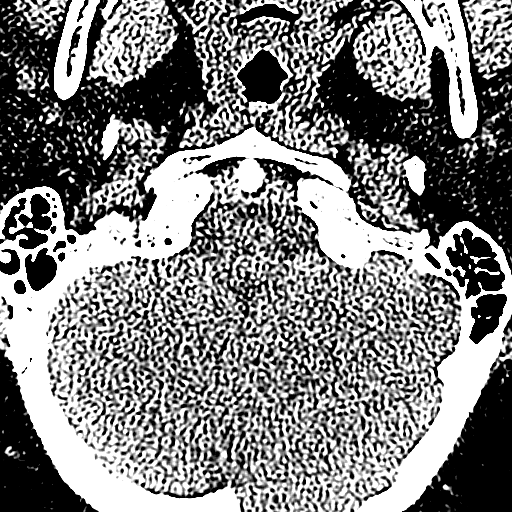

[17 of 30 positions shown; findings below may reference images not displayed]

FINDINGS: CT HEAD FINDINGS

The ventricles and sulci are normal. No intraparenchymal hemorrhage,
mass effect nor midline shift. No acute large vascular territory
infarcts.

No abnormal extra-axial fluid collections. Basal cisterns are
patent.

No skull fracture. The included ocular globes and orbital contents
are non-suspicious. The mastoid aircells and included paranasal
sinuses are well-aerated. Similar fullness of the nasopharyngeal
soft tissues.

CT CERVICAL SPINE FINDINGS

Large body habitus results in noisy image quality.

Cervical vertebral bodies and posterior elements are intact and
aligned with straightened cervical lordosis. Intervertebral disc
heights preserved. No destructive bony lesions. C1-2 articulation
maintained. Included prevertebral and paraspinal soft tissues are
nonsuspicious.
IMPRESSION: CT head:  No acute intracranial process.

CT cervical spine: Habitus limited examination. Straightened
cervical lordosis without acute fracture nor malalignment.

  By: Djacic Nejic

## 2014-11-28 ENCOUNTER — Emergency Department (HOSPITAL_COMMUNITY): Payer: Medicaid Other

## 2014-11-28 ENCOUNTER — Emergency Department (HOSPITAL_COMMUNITY)
Admission: EM | Admit: 2014-11-28 | Discharge: 2014-11-28 | Disposition: A | Discharge status: Home / Self Care | Payer: Medicaid Other | Attending: Emergency Medicine | Admitting: Emergency Medicine

## 2014-11-28 ENCOUNTER — Encounter (HOSPITAL_COMMUNITY): Payer: Self-pay | Admitting: Emergency Medicine

## 2014-11-28 DIAGNOSIS — G43909 Migraine, unspecified, not intractable, without status migrainosus: Secondary | ICD-10-CM | POA: Insufficient documentation

## 2014-11-28 DIAGNOSIS — Z8619 Personal history of other infectious and parasitic diseases: Secondary | ICD-10-CM | POA: Insufficient documentation

## 2014-11-28 DIAGNOSIS — F419 Anxiety disorder, unspecified: Secondary | ICD-10-CM | POA: Diagnosis not present

## 2014-11-28 DIAGNOSIS — E119 Type 2 diabetes mellitus without complications: Secondary | ICD-10-CM | POA: Insufficient documentation

## 2014-11-28 DIAGNOSIS — F329 Major depressive disorder, single episode, unspecified: Secondary | ICD-10-CM | POA: Insufficient documentation

## 2014-11-28 DIAGNOSIS — Y998 Other external cause status: Secondary | ICD-10-CM | POA: Insufficient documentation

## 2014-11-28 DIAGNOSIS — W1839XA Other fall on same level, initial encounter: Secondary | ICD-10-CM | POA: Insufficient documentation

## 2014-11-28 DIAGNOSIS — Z87440 Personal history of urinary (tract) infections: Secondary | ICD-10-CM | POA: Diagnosis not present

## 2014-11-28 DIAGNOSIS — E669 Obesity, unspecified: Secondary | ICD-10-CM | POA: Insufficient documentation

## 2014-11-28 DIAGNOSIS — Y9389 Activity, other specified: Secondary | ICD-10-CM | POA: Diagnosis not present

## 2014-11-28 DIAGNOSIS — Z87442 Personal history of urinary calculi: Secondary | ICD-10-CM | POA: Insufficient documentation

## 2014-11-28 DIAGNOSIS — S99922A Unspecified injury of left foot, initial encounter: Secondary | ICD-10-CM | POA: Insufficient documentation

## 2014-11-28 DIAGNOSIS — S99912A Unspecified injury of left ankle, initial encounter: Secondary | ICD-10-CM | POA: Insufficient documentation

## 2014-11-28 DIAGNOSIS — Z720 Tobacco use: Secondary | ICD-10-CM | POA: Diagnosis not present

## 2014-11-28 DIAGNOSIS — Y9289 Other specified places as the place of occurrence of the external cause: Secondary | ICD-10-CM | POA: Insufficient documentation

## 2014-11-28 DIAGNOSIS — Z79899 Other long term (current) drug therapy: Secondary | ICD-10-CM | POA: Diagnosis not present

## 2014-11-28 DIAGNOSIS — S3992XA Unspecified injury of lower back, initial encounter: Secondary | ICD-10-CM | POA: Diagnosis present

## 2014-11-28 DIAGNOSIS — M545 Low back pain, unspecified: Secondary | ICD-10-CM

## 2014-11-28 MED ORDER — OXYCODONE-ACETAMINOPHEN 5-325 MG PO TABS: 2.0000 | ORAL_TABLET | ORAL | Status: DC | PRN

## 2014-11-28 MED ORDER — OXYCODONE-ACETAMINOPHEN 5-325 MG PO TABS
2.0000 | ORAL_TABLET | Freq: Once | ORAL | Status: AC
  Administered 2014-11-28: 2 via ORAL
  Filled 2014-11-28: qty 2

## 2014-11-28 MED ORDER — NAPROXEN 500 MG PO TABS
500.0000 mg | ORAL_TABLET | Freq: Two times a day (BID) | ORAL | Status: DC
Start: 2014-11-28 — End: 2014-12-04

## 2014-11-28 MED ORDER — ONDANSETRON HCL 4 MG PO TABS
4.0000 mg | ORAL_TABLET | Freq: Once | ORAL | Status: AC
  Administered 2014-11-28: 4 mg via ORAL
  Filled 2014-11-28: qty 1

## 2014-11-28 MED ORDER — DIAZEPAM 5 MG PO TABS: 5.0000 mg | ORAL_TABLET | Freq: Two times a day (BID) | ORAL | Status: DC

## 2014-11-28 MED ORDER — ONDANSETRON HCL 4 MG PO TABS: 4.0000 mg | ORAL_TABLET | Freq: Four times a day (QID) | ORAL | Status: DC

## 2014-11-28 NOTE — ED Notes (Signed)
Bed: WTR6 Expected date:  Expected time:  Means of arrival:  Comments: 

## 2014-11-28 NOTE — Discharge Instructions (Signed)

## 2014-11-28 NOTE — ED Notes (Signed)
Patient returned from XR. 

## 2014-11-28 NOTE — ED Provider Notes (Signed)
CSN: [id number]     Arrival date & time 11/28/14  0954 History   First MD Initiated Contact with Patient 11/28/14 [phone]     Chief Complaint  Patient presents with  . Ankle Pain    audio speaker fell on foot  . Toe Pain    l/foot pain     (Consider location/radiation/quality/duration/timing/severity/associated sxs/prior Treatment) The history is provided by the patient. No language interpreter was used.   [name] is a 29 year old female who states she was helping her brother move some equipment for his wedding yesterday and dropped a speaker on her left foot which made her fall back into a brick wall. She is complaining of worsening and constant left foot pain and back pain. She states she has not taken anything for pain. She states she was able to walk after falling that has been limping since. No bowel or bladder incontinence or retention. She denies any weakness, numbness, tingling, head injury, loss of consciousness, wound. She has no difficulty urinating and is currently on birth control.  Past Medical History  Diagnosis Date  . Depression   . Bipolar 1 disorder, depressed   . PTSD (post-traumatic stress disorder)   . Kidney stone   . Migraine   . Urinary tract infection   . Pregnancy induced hypertension   . Obesity   . UTI (lower urinary tract infection)   . Diabetes mellitus without complication   . Hep C w/o coma, chronic    Past Surgical History  Procedure Laterality Date  . Kidney stones    . Lipotripsy     Family History  Problem Relation Age of Onset  . Rheum arthritis Mother   . Asthma Mother   . Hypertension Mother   . Other Neg Hx   . CAD Other    History  Substance Use Topics  . Smoking status: Current Every Day Smoker -- 1.00 packs/day    Types: Cigarettes  . Smokeless tobacco: Never Used  . Alcohol Use: No   OB History    Gravida Para Term Preterm AB TAB SAB Ectopic Multiple Living   Review of Systems  Musculoskeletal:  Positive for back pain and gait problem. Negative for neck pain and neck stiffness.  Skin: Negative for wound.  Neurological: Negative for syncope, weakness and numbness.  All other systems reviewed and are negative.     Allergies  Acetaminophen; Darvocet; Morphine and related; Sulfa antibiotics; Tomato; and Pioglitazone  Home Medications   Prior to Admission medications   Medication Sig Start Date End Date Taking? Authorizing Provider  ALPRAZolam Prudy Feeler) 1 MG tablet Take 1 mg by mouth 3 (three) times daily as needed for anxiety (anxiety).    Yes Historical Provider, MD  butalbital-acetaminophen-caffeine (FIORICET, ESGIC) 50-325-40 MG per tablet Take 1 tablet by mouth as needed for headache or migraine (headache).    Yes Historical Provider, MD  diphenhydrAMINE (SOMINEX) 25 MG tablet Take 25 mg by mouth 2 (two) times daily as needed for sleep (cold symptoms).   Yes Historical Provider, MD  glimepiride (AMARYL) 4 MG tablet Take 4 mg by mouth daily with breakfast.   Yes Historical Provider, MD  HYDROcodone-acetaminophen (NORCO) 10-325 MG per tablet Take 1 tablet by mouth 3 (three) times daily as needed (pain).   Yes Historical Provider, MD  ibuprofen (ADVIL,MOTRIN) 800 MG tablet Take 800 mg by mouth every 8 (eight) hours as needed for moderate pain (  swelling).   Yes Historical Provider, MD  Omega-3 Fatty Acids (FISH OIL PO) Take 1 capsule by mouth 3 (three) times daily.   Yes Historical Provider, MD  Prenatal Vit-Fe Fumarate-FA (PRENATAL MULTIVITAMIN) TABS tablet Take 1 tablet by mouth every morning.    Yes Historical Provider, MD  diazepam (VALIUM) 5 MG tablet Take 1 tablet (5 mg total) by mouth 2 (two) times daily. 11/28/14   Ura Yingling Patel-Mills, PA-C  naproxen (NAPROSYN) 500 MG tablet Take 1 tablet (500 mg total) by mouth 2 (two) times daily. 11/28/14   Shazia Mitchener Patel-Mills, PA-C  ondansetron (ZOFRAN ODT) 4 MG disintegrating tablet  ODT q4 hours prn nausea/vomit Patient not taking: Reported on  11/28/2014 07/29/14   Purvis Sheffield, MD  ondansetron (ZOFRAN) 4 MG tablet Take 1 tablet (4 mg total) by mouth every 6 (six) hours. 11/28/14   Kailan Laws Patel-Mills, PA-C  oxyCODONE-acetaminophen (PERCOCET/ROXICET) 5-325 MG per tablet Take 2 tablets by mouth every 4 (four) hours as needed for severe pain. 11/28/14   Chinara Hertzberg Patel-Mills, PA-C  pantoprazole (PROTONIX) 20 MG tablet Take 1 tablet (20 mg total) by mouth daily. Patient not taking: Reported on 11/28/2014 08/03/14   Bethann Berkshire, MD   BP 124/60 mmHg  Pulse 78  Temp(Src) 98.3 F (36.8 C) (Oral)  Resp 18  SpO2 99%  LMP 11/20/2014 (Exact Date) Physical Exam  Constitutional: She is oriented to person, place, and time. Vital signs are normal. She appears well-developed and well-nourished.  Obese.  HENT:  Head: Normocephalic and atraumatic.  Eyes: Conjunctivae and EOM are normal.  Neck: Normal range of motion. Neck supple.  Cardiovascular: Normal rate and regular rhythm.   Pulmonary/Chest: Effort normal.  Abdominal: Soft.  Musculoskeletal:  Left foot: Tenderness to palpation of the fifth metatarsal. Good DP pulse. Able to rotate the ankle. NVI. She is able to plantar flex and dorsiflex without difficulty.  Lumbar spine: No midline tenderness. She has right paravertebral lumbar tenderness to palpation. Pain along the sacrum. No weakness in the lower extremities. No saddle anesthesia.  Neurological: She is alert and oriented to person, place, and time.  Skin: Skin is warm and dry.  Nursing note and vitals reviewed.   ED Course  Procedures (including critical care time) Labs Review Labs Reviewed - No data to display  Imaging Review Dg Sacrum/coccyx  11/28/2014   CLINICAL DATA:  Sacrococcygeal pain after falling onto her back at a wedding last night.  EXAM: SACRUM AND COCCYX - 2+ VIEW  COMPARISON:  Pelvis CT dated 08/18/2013.  FINDINGS: Stable anterior angulation of the coccyx relative to the sacrum. No fractures or subluxations seen.   IMPRESSION: No fracture or subluxation.   Electronically Signed   By: Beckie Salts M.D.   On: 11/28/2014 11:15   Dg Foot Complete Left  11/28/2014   CLINICAL DATA:  Pt was at wedding last night and a speaker was dropped on left foot causing pt to fall unto back, left foot pain, tailbone pain  EXAM: LEFT FOOT - COMPLETE 3+ VIEW  COMPARISON:  None.  FINDINGS: No fracture or dislocation of mid foot or forefoot. The phalanges are normal. The calcaneus is normal. No soft tissue abnormality.  IMPRESSION: No fracture or dislocation.   Electronically Signed   By: Genevive Bi M.D.   On: 11/28/2014 11:16     EKG Interpretation None      MDM   Final diagnoses:  Right-sided low back pain without sciatica  Patient presents for left foot pain after a speaker  fell on it and back pain after falling backwards. No signs of cord compression or vertebral fracture. I reviewed the [place] ankle rules. She has no fracture of the left foot or sacrum/coccyx.   Medications  oxyCODONE-acetaminophen (PERCOCET/ROXICET) 5-325 MG per tablet 2 tablet (2 tablets Oral Given 11/28/14 1034)  ondansetron (ZOFRAN) tablet 4 mg (4 mg Oral Given 11/28/14 1058)  I gave her an ankle brace, valium, naproxen. She can follow up outpatient and agrees with the plan.       Catha Gosselin, PA-C 11/29/14 0912  Samuel Jester, DO 11/30/14 [phone]

## 2014-11-28 NOTE — ED Notes (Signed)
Pt ambulating in room and up to bathroom without assist

## 2014-11-28 NOTE — ED Notes (Signed)
Pt reports that a large audio speaker fell onto l/ankle. C/o l/ankle pain. Top of speaker struck 4th toe on l/foot. Toe is red, not swollen. Pt applied ice after injury last night

## 2014-12-01 ENCOUNTER — Emergency Department (HOSPITAL_COMMUNITY)
Admission: EM | Admit: 2014-12-01 | Discharge: 2014-12-01 | Disposition: A | Discharge status: Home / Self Care | Payer: Medicaid Other | Attending: Emergency Medicine | Admitting: Emergency Medicine

## 2014-12-01 ENCOUNTER — Emergency Department (HOSPITAL_COMMUNITY): Payer: Medicaid Other

## 2014-12-01 ENCOUNTER — Encounter (HOSPITAL_COMMUNITY): Payer: Self-pay

## 2014-12-01 DIAGNOSIS — W010XXA Fall on same level from slipping, tripping and stumbling without subsequent striking against object, initial encounter: Secondary | ICD-10-CM | POA: Diagnosis not present

## 2014-12-01 DIAGNOSIS — Z87440 Personal history of urinary (tract) infections: Secondary | ICD-10-CM | POA: Insufficient documentation

## 2014-12-01 DIAGNOSIS — F329 Major depressive disorder, single episode, unspecified: Secondary | ICD-10-CM | POA: Diagnosis not present

## 2014-12-01 DIAGNOSIS — M25551 Pain in right hip: Secondary | ICD-10-CM

## 2014-12-01 DIAGNOSIS — S0990XA Unspecified injury of head, initial encounter: Secondary | ICD-10-CM | POA: Diagnosis not present

## 2014-12-01 DIAGNOSIS — S79911A Unspecified injury of right hip, initial encounter: Secondary | ICD-10-CM | POA: Insufficient documentation

## 2014-12-01 DIAGNOSIS — Y999 Unspecified external cause status: Secondary | ICD-10-CM | POA: Insufficient documentation

## 2014-12-01 DIAGNOSIS — Y929 Unspecified place or not applicable: Secondary | ICD-10-CM | POA: Insufficient documentation

## 2014-12-01 DIAGNOSIS — E119 Type 2 diabetes mellitus without complications: Secondary | ICD-10-CM | POA: Insufficient documentation

## 2014-12-01 DIAGNOSIS — L03011 Cellulitis of right finger: Secondary | ICD-10-CM

## 2014-12-01 DIAGNOSIS — Z87442 Personal history of urinary calculi: Secondary | ICD-10-CM | POA: Diagnosis not present

## 2014-12-01 DIAGNOSIS — Z8619 Personal history of other infectious and parasitic diseases: Secondary | ICD-10-CM | POA: Diagnosis not present

## 2014-12-01 DIAGNOSIS — Y939 Activity, unspecified: Secondary | ICD-10-CM | POA: Diagnosis not present

## 2014-12-01 DIAGNOSIS — E669 Obesity, unspecified: Secondary | ICD-10-CM | POA: Diagnosis not present

## 2014-12-01 DIAGNOSIS — S3992XA Unspecified injury of lower back, initial encounter: Secondary | ICD-10-CM | POA: Diagnosis not present

## 2014-12-01 DIAGNOSIS — Z791 Long term (current) use of non-steroidal anti-inflammatories (NSAID): Secondary | ICD-10-CM | POA: Insufficient documentation

## 2014-12-01 DIAGNOSIS — Z720 Tobacco use: Secondary | ICD-10-CM | POA: Diagnosis not present

## 2014-12-01 MED ORDER — DIAZEPAM 5 MG/ML IJ SOLN
5.0000 mg | Freq: Once | INTRAMUSCULAR | Status: AC
  Administered 2014-12-01: 5 mg via INTRAMUSCULAR

## 2014-12-01 MED ORDER — KETOROLAC TROMETHAMINE 60 MG/2ML IM SOLN
60.0000 mg | Freq: Once | INTRAMUSCULAR | Status: AC
  Administered 2014-12-01: 60 mg via INTRAMUSCULAR
  Filled 2014-12-01: qty 2

## 2014-12-01 MED ORDER — DIAZEPAM 5 MG/ML IJ SOLN
5.0000 mg | Freq: Once | INTRAMUSCULAR | Status: DC
  Filled 2014-12-01: qty 2

## 2014-12-01 MED ORDER — CEPHALEXIN 500 MG PO CAPS: 500.0000 mg | ORAL_CAPSULE | Freq: Two times a day (BID) | ORAL | Status: DC

## 2014-12-01 MED ORDER — METHOCARBAMOL 500 MG PO TABS: 500.0000 mg | ORAL_TABLET | Freq: Two times a day (BID) | ORAL | Status: DC

## 2014-12-01 NOTE — ED Notes (Signed)
Patient states that she fell on a wet floor yesterday, landing on her right hip. Patient c/o increased pain when she ambulates. Patient also has slight swelling and redness to the right 5th finger. Patient states she thinks she got bit by an insect.

## 2014-12-01 NOTE — ED Notes (Signed)
Pt c/o R hip and R pinky finger pain after a slip and fall yesterday.  Swelling noted to finger.

## 2014-12-01 NOTE — ED Notes (Addendum)
Pt escorted to discharge window. Verbalized understanding discharge instructions. In no acute distress.  Pt educated not to driver or operate heavy machinery w/ medication.

## 2014-12-01 NOTE — ED Notes (Signed)
Patient crying uncontrollably about right 5th finger pain. She states, " I believe it's broken or infected".

## 2014-12-01 NOTE — ED Provider Notes (Signed)
CSN: [id number]     Arrival date & time 12/01/14  1322 History   First MD Initiated Contact with Patient 12/01/14 1434     Chief Complaint  Patient presents with  . Hip Pain  . Hand Pain     (Consider location/radiation/quality/duration/timing/severity/associated sxs/prior Treatment) HPI Pt is a 29yo female with hx of depression, bipolar 1 disorder, PTSD, and NIDDM, presenting to ED with c/o gradually worsening Right sided hip pain that has been intermittent for several weeks but worsened yesterday when she tripped and fell on her Right side. Pain is constant, aching, and throbbing, 10/10, worse with ambulation. Pt also c/o Right little finger pain and not redness and swelling. States it has been aching and sore for about 3 weeks but was worsened yesterday after fall. Pt unsure if it is broken or infected. She has been taking ibuprofen w/o relief. States she was on suboxone but they took her off of the medication and she states she needs to find a new PCP. Denies fever, chills, n/v/d.   Past Medical History  Diagnosis Date  . Depression   . Bipolar 1 disorder, depressed   . PTSD (post-traumatic stress disorder)   . Kidney stone   . Migraine   . Urinary tract infection   . Pregnancy induced hypertension   . Obesity   . UTI (lower urinary tract infection)   . Diabetes mellitus without complication   . Hep C w/o coma, chronic    Past Surgical History  Procedure Laterality Date  . Kidney stones    . Lipotripsy     Family History  Problem Relation Age of Onset  . Rheum arthritis Mother   . Asthma Mother   . Hypertension Mother   . Other Neg Hx   . CAD Other    History  Substance Use Topics  . Smoking status: Current Every Day Smoker -- 1.00 packs/day    Types: Cigarettes  . Smokeless tobacco: Never Used  . Alcohol Use: No   OB History    Gravida Para Term Preterm AB TAB SAB Ectopic Multiple Living   Review of Systems  Constitutional: Negative for  fever, chills and fatigue.  Gastrointestinal: Negative for nausea, vomiting, abdominal pain and diarrhea.  Musculoskeletal: Positive for myalgias, back pain, arthralgias and gait problem.  Skin: Positive for color change and wound. Negative for pallor and rash.  Neurological: Negative for seizures, syncope, weakness and numbness.  All other systems reviewed and are negative.     Allergies  Acetaminophen; Darvocet; Morphine and related; Sulfa antibiotics; Tomato; and Pioglitazone  Home Medications   Prior to Admission medications   Medication Sig Start Date End Date Taking? Authorizing Provider  ALPRAZolam Prudy Feeler) 1 MG tablet Take 1 mg by mouth 3 (three) times daily as needed for anxiety (anxiety).    Yes Historical Provider, MD  butalbital-acetaminophen-caffeine (FIORICET, ESGIC) 50-325-40 MG per tablet Take 1 tablet by mouth as needed for headache or migraine (headache).    Yes Historical Provider, MD  diazepam (VALIUM) 5 MG tablet Take 1 tablet (5 mg total) by mouth 2 (two) times daily. 11/28/14  Yes Hanna Patel-Mills, PA-C  diphenhydrAMINE (SOMINEX) 25 MG tablet Take 25 mg by mouth 2 (two) times daily as needed for sleep (cold symptoms).   Yes Historical Provider, MD  glimepiride (AMARYL) 4 MG tablet Take 4 mg by mouth daily with breakfast.   Yes Historical Provider, MD  HYDROcodone-acetaminophen (NORCO) 10-325 MG per tablet Take 1 tablet by mouth 3 (three) times daily as needed (pain).   Yes Historical Provider, MD  ibuprofen (ADVIL,MOTRIN) 800 MG tablet Take 800 mg by mouth every 8 (eight) hours as needed for moderate pain (swelling).   Yes Historical Provider, MD  naproxen (NAPROSYN) 500 MG tablet Take 1 tablet (500 mg total) by mouth 2 (two) times daily. 11/28/14  Yes Hanna Patel-Mills, PA-C  Omega-3 Fatty Acids (FISH OIL PO) Take 1 capsule by mouth 3 (three) times daily.   Yes Historical Provider, MD  ondansetron (ZOFRAN) 4 MG tablet Take 1 tablet (4 mg total) by mouth every 6 (six)  hours. 11/28/14  Yes Hanna Patel-Mills, PA-C  oxyCODONE-acetaminophen (PERCOCET/ROXICET) 5-325 MG per tablet Take 2 tablets by mouth every 4 (four) hours as needed for severe pain. 11/28/14  Yes Hanna Patel-Mills, PA-C  Prenatal Vit-Fe Fumarate-FA (PRENATAL MULTIVITAMIN) TABS tablet Take 1 tablet by mouth every morning.    Yes Historical Provider, MD  cephALEXin (KEFLEX) 500 MG capsule Take 1 capsule (500 mg total) by mouth 2 (two) times daily. For 7 days 12/01/14   Junius Finner, PA-C  methocarbamol (ROBAXIN) 500 MG tablet Take 1 tablet (500 mg total) by mouth 2 (two) times daily. 12/01/14   Junius Finner, PA-C  ondansetron (ZOFRAN ODT) 4 MG disintegrating tablet  ODT q4 hours prn nausea/vomit Patient not taking: Reported on 11/28/2014 07/29/14   Purvis Sheffield, MD  pantoprazole (PROTONIX) 20 MG tablet Take 1 tablet (20 mg total) by mouth daily. Patient not taking: Reported on 11/28/2014 08/03/14   Bethann Berkshire, MD   BP 130/74 mmHg  Pulse 70  Temp(Src) 98.1 F (36.7 C) (Oral)  Resp 16  SpO2 94%  LMP 11/20/2014 (Exact Date) Physical Exam  Constitutional: She is oriented to person, place, and time. She appears well-developed and well-nourished.  Morbidly obese female lying in exam bed, tearful  HENT:  Head: Normocephalic and atraumatic.  Eyes: EOM are normal.  Neck: Normal range of motion.  Cardiovascular: Normal rate.   Right little finger: cap refill <3 seconds  Pulmonary/Chest: Effort normal.  Musculoskeletal: Normal range of motion. She exhibits tenderness. She exhibits no edema.  Right hip: tenderness to lateral aspect. FROM w/o crepitus. FROM Right knee. 5/5 strength bilateral lower extremities.  No midline spinal tenderness. Right hand, little finger: FROM, mild edema to distal aspect with tenderness.   Neurological: She is alert and oriented to person, place, and time.  Sensation in tact, symmetric in upper and lower extremities bilaterally   Skin: Skin is warm and dry. There is  erythema.  Right little finger, distal aspect: dried skin and cuticles around nail. No discharge or bleeding. No induration or fluctuance. Surrounding erythema that does not extend beyond DIP  Psychiatric: Her behavior is normal. She exhibits a depressed mood ( tearful).  Nursing note and vitals reviewed.   ED Course  Procedures (including critical care time) Labs Review Labs Reviewed - No data to display  Imaging Review Dg Finger Little Right  12/01/2014   CLINICAL DATA:  Initial encounter for hand pain  EXAM: RIGHT LITTLE FINGER 2+V  COMPARISON:  None.  FINDINGS: There is no evidence of fracture or dislocation. There is no evidence of arthropathy or other focal bone abnormality. Soft tissues are unremarkable.  IMPRESSION: Negative.   Electronically Signed   By: Kennith Center M.D.   On: 12/01/2014 14:14   Dg Hip Unilat  With Pelvis 2-3 Views Right  12/01/2014  CLINICAL DATA:  Patient slipped on wet floor and fell yesterday. Sharp pain with walking.  EXAM: RIGHT HIP (WITH PELVIS) 2-3 VIEWS  COMPARISON:  None.  FINDINGS: Frontal pelvis as well as frontal and lateral right hip images obtained. Frontal pelvis as well as frontal and lateral right hip images obtained. No fracture or dislocation. Joint spaces appear intact. No erosive change.  IMPRESSION: No fracture or dislocation.  No appreciable arthropathy.   Electronically Signed   By: Bretta Bang III M.D.   On: 12/01/2014 14:13     EKG Interpretation None      MDM   Final diagnoses:  Hip pain, acute, right  Cellulitis of fifth finger, right  Fall from slip, trip, or stumble, initial encounter   Pt c/o Right hip pain and Right little finger pain and redness.   No focal neuro deficits. Tenderness to right hip. Plain films: no fracture or dislocation. Pt given IM valium and toradol in ED.  Per [place] Controlled substance database, pt was just on suboxone last month.  Do not feel comfortable discharging pt home with narcotics.  Rx:  robaxin. Pt has 800mg  ibuprofen at home.  advised to f/u with PCP.   Right little finger, normal plain films. On exam, Right little finger, distal aspect does have mild edema and erythema with tenderness. No induration or fluctuance. FROM. No bleeding or discharge. No obvious abscess on exam. Low concern for felon at this time. Will tx for cellulitis with keflex. Encouraged pt to have finger reevaluated in 2-3 days if not improving.  Home care instructions provided. Return precautions provided. Pt verbalized understanding and agreement with tx plan.   Junius Finner, PA-C 12/01/14 1459  Junius Finner, PA-C 12/01/14 1459  Benjiman Core, MD 12/01/14 [phone]

## 2014-12-02 ENCOUNTER — Emergency Department (HOSPITAL_COMMUNITY)
Admission: EM | Admit: 2014-12-02 | Discharge: 2014-12-02 | Disposition: A | Discharge status: Home / Self Care | Payer: Medicaid Other | Attending: Emergency Medicine | Admitting: Emergency Medicine

## 2014-12-02 ENCOUNTER — Encounter (HOSPITAL_COMMUNITY): Payer: Self-pay | Admitting: *Deleted

## 2014-12-02 DIAGNOSIS — E669 Obesity, unspecified: Secondary | ICD-10-CM | POA: Insufficient documentation

## 2014-12-02 DIAGNOSIS — F329 Major depressive disorder, single episode, unspecified: Secondary | ICD-10-CM | POA: Insufficient documentation

## 2014-12-02 DIAGNOSIS — Z8679 Personal history of other diseases of the circulatory system: Secondary | ICD-10-CM | POA: Diagnosis not present

## 2014-12-02 DIAGNOSIS — Z87442 Personal history of urinary calculi: Secondary | ICD-10-CM | POA: Insufficient documentation

## 2014-12-02 DIAGNOSIS — Z8619 Personal history of other infectious and parasitic diseases: Secondary | ICD-10-CM | POA: Diagnosis not present

## 2014-12-02 DIAGNOSIS — Z79899 Other long term (current) drug therapy: Secondary | ICD-10-CM | POA: Diagnosis not present

## 2014-12-02 DIAGNOSIS — Z720 Tobacco use: Secondary | ICD-10-CM | POA: Insufficient documentation

## 2014-12-02 DIAGNOSIS — E119 Type 2 diabetes mellitus without complications: Secondary | ICD-10-CM | POA: Diagnosis not present

## 2014-12-02 DIAGNOSIS — L03011 Cellulitis of right finger: Secondary | ICD-10-CM

## 2014-12-02 DIAGNOSIS — M79644 Pain in right finger(s): Secondary | ICD-10-CM | POA: Diagnosis present

## 2014-12-02 DIAGNOSIS — Z87440 Personal history of urinary (tract) infections: Secondary | ICD-10-CM | POA: Diagnosis not present

## 2014-12-02 MED ORDER — CLINDAMYCIN HCL 300 MG PO CAPS
300.0000 mg | ORAL_CAPSULE | Freq: Once | ORAL | Status: AC
  Administered 2014-12-02: 300 mg via ORAL
  Filled 2014-12-02: qty 1

## 2014-12-02 MED ORDER — LIDOCAINE HCL 2 % IJ SOLN
10.0000 mL | Freq: Once | INTRAMUSCULAR | Status: AC
  Administered 2014-12-02: 100 mg via INTRADERMAL

## 2014-12-02 MED ORDER — DIPHENHYDRAMINE HCL 50 MG/ML IJ SOLN
25.0000 mg | Freq: Once | INTRAMUSCULAR | Status: AC
Start: 2014-12-02 — End: 2014-12-02
  Administered 2014-12-02: 25 mg via INTRAMUSCULAR
  Filled 2014-12-02: qty 1

## 2014-12-02 MED ORDER — CLINDAMYCIN HCL 300 MG PO CAPS: 300.0000 mg | ORAL_CAPSULE | Freq: Three times a day (TID) | ORAL | Status: DC

## 2014-12-02 MED ORDER — ONDANSETRON 4 MG PO TBDP
4.0000 mg | ORAL_TABLET | Freq: Once | ORAL | Status: AC
  Administered 2014-12-02: 4 mg via ORAL
  Filled 2014-12-02: qty 1

## 2014-12-02 MED ORDER — MORPHINE SULFATE 4 MG/ML IJ SOLN
6.0000 mg | Freq: Once | INTRAMUSCULAR | Status: AC
Start: 2014-12-02 — End: 2014-12-02
  Administered 2014-12-02: 6 mg via INTRAMUSCULAR
  Filled 2014-12-02: qty 2

## 2014-12-02 MED ORDER — LIDOCAINE HCL 2 % IJ SOLN
INTRAMUSCULAR | Status: AC
  Administered 2014-12-02: 100 mg via INTRADERMAL
  Filled 2014-12-02: qty 20

## 2014-12-02 MED ORDER — HYDROCODONE-ACETAMINOPHEN 10-325 MG PO TABS: 1.0000 | ORAL_TABLET | Freq: Four times a day (QID) | ORAL | Status: DC | PRN

## 2014-12-02 NOTE — ED Notes (Signed)
Pt c/o rt hand / finger pain; pt was seen earlier in the shift and diagnosed with cellulitis; pt states that the pain has been increasing and pt reports vomiting this pm; pt states that she has vomited 4-5 times since then; pt states that she only get relief from the pain by squeezing area

## 2014-12-02 NOTE — Progress Notes (Signed)
CM received consult from unit [facility] Session when pt called [facility] Pt is a [employer] pt without money per pt to get prescribed meds from 12/02/14  CM reached pt at [phone] and discussed that [facility] does not have a program that can provide her $3 or less co pay until she gets paid on "monday" CM recommended she speak with family, friends, local churches Cm also recommends pt call her pharmacy [facility] or another discount pharmacy (switch out pharmacy) to see if they would provide her credit until she can pay on Monday. Pt thanked CM for recommendations and voiced understanding that [facility] did not have an available program.

## 2014-12-02 NOTE — ED Notes (Signed)
Patient reports shooting pain from right hand to elbow.  Seen for same a few hours ago.  States pain has gradually increased since she was in ED earlier this shift. Reports she had a fever of 102 at home, afebrile in ED.

## 2014-12-02 NOTE — Discharge Instructions (Signed)
Take clinda three times daily for a week.   Use warm soaks three times daily.   Take norco for severe pain. Do NOT drive with it   Do NOT bite your nails.   See hand surgery in a week if your finger is still red and swollen.   See primary care doctor.   Return to ER if you have fever, severe pain, purulent discharge from the finger.

## 2014-12-02 NOTE — ED Provider Notes (Addendum)
CSN: [id number]     Arrival date & time 12/02/14  5284 History   First MD Initiated Contact with Patient 12/02/14 [phone]     Chief Complaint  Patient presents with  . Cellulitis     (Consider location/radiation/quality/duration/timing/severity/associated sxs/prior Treatment) The history is provided by the patient.  LATONDRA GEBHART is a 29 y.o. female hx of depression, bipolar, PTSD, UTI here with right little finger pain. Patient has been on a lot of stress and her father just died yesterday. Patient states that she has been biting her nails a lot. She noticed that her right pinky has been swollen for the last week or so. Finish her course of Augmentin with no relief. She came to the ER yesterday and was prescribed Keflex, Robaxin. Patient was on Suboxone last month but now has been off of it. Some subjective fevers and chills and had an episode of vomiting. Has more pain since yesterday, no purulent drainage.    Past Medical History  Diagnosis Date  . Depression   . Bipolar 1 disorder, depressed   . PTSD (post-traumatic stress disorder)   . Kidney stone   . Migraine   . Urinary tract infection   . Pregnancy induced hypertension   . Obesity   . UTI (lower urinary tract infection)   . Diabetes mellitus without complication   . Hep C w/o coma, chronic    Past Surgical History  Procedure Laterality Date  . Kidney stones    . Lipotripsy     Family History  Problem Relation Age of Onset  . Rheum arthritis Mother   . Asthma Mother   . Hypertension Mother   . Other Neg Hx   . CAD Other    History  Substance Use Topics  . Smoking status: Current Every Day Smoker -- 1.00 packs/day    Types: Cigarettes  . Smokeless tobacco: Never Used  . Alcohol Use: No   OB History    Gravida Para Term Preterm AB TAB SAB Ectopic Multiple Living   Review of Systems  Skin: Positive for wound.  All other systems reviewed and are negative.     Allergies   Acetaminophen; Darvocet; Morphine and related; Sulfa antibiotics; Tomato; and Pioglitazone  Home Medications   Prior to Admission medications   Medication Sig Start Date End Date Taking? Authorizing Provider  ALPRAZolam Prudy Feeler) 1 MG tablet Take 1 mg by mouth 3 (three) times daily as needed for anxiety (anxiety).    Yes Historical Provider, MD  butalbital-acetaminophen-caffeine (FIORICET, ESGIC) 50-325-40 MG per tablet Take 1 tablet by mouth as needed for headache or migraine (headache).    Yes Historical Provider, MD  cephALEXin (KEFLEX) 500 MG capsule Take 1 capsule (500 mg total) by mouth 2 (two) times daily. For 7 days 12/01/14  Yes Junius Finner, PA-C  diphenhydrAMINE (SOMINEX) 25 MG tablet Take 25 mg by mouth 2 (two) times daily as needed for sleep (cold symptoms).   Yes Historical Provider, MD  glimepiride (AMARYL) 4 MG tablet Take 4 mg by mouth daily with breakfast.   Yes Historical Provider, MD  HYDROcodone-acetaminophen (NORCO) 10-325 MG per tablet Take 1 tablet by mouth 3 (three) times daily as needed (pain).   Yes Historical Provider, MD  ibuprofen (ADVIL,MOTRIN) 800 MG tablet Take 800 mg by mouth every 8 (eight) hours as needed for moderate pain (swelling).   Yes Historical Provider, MD  methocarbamol (ROBAXIN) 500  MG tablet Take 1 tablet (500 mg total) by mouth 2 (two) times daily. 12/01/14  Yes Junius Finner, PA-C  Omega-3 Fatty Acids (FISH OIL PO) Take 1 capsule by mouth 3 (three) times daily.   Yes Historical Provider, MD  ondansetron (ZOFRAN) 4 MG tablet Take 1 tablet (4 mg total) by mouth every 6 (six) hours. 11/28/14  Yes Hanna Patel-Mills, PA-C  Prenatal Vit-Fe Fumarate-FA (PRENATAL MULTIVITAMIN) TABS tablet Take 1 tablet by mouth every morning.    Yes Historical Provider, MD  diazepam (VALIUM) 5 MG tablet Take 1 tablet (5 mg total) by mouth 2 (two) times daily. Patient not taking: Reported on 12/02/2014 11/28/14   Catha Gosselin, PA-C  naproxen (NAPROSYN) 500 MG tablet Take 1  tablet (500 mg total) by mouth 2 (two) times daily. Patient not taking: Reported on 12/02/2014 11/28/14   Catha Gosselin, PA-C  ondansetron (ZOFRAN ODT) 4 MG disintegrating tablet  ODT q4 hours prn nausea/vomit Patient not taking: Reported on 11/28/2014 07/29/14   Purvis Sheffield, MD  oxyCODONE-acetaminophen (PERCOCET/ROXICET) 5-325 MG per tablet Take 2 tablets by mouth every 4 (four) hours as needed for severe pain. Patient not taking: Reported on 12/02/2014 11/28/14   Catha Gosselin, PA-C  pantoprazole (PROTONIX) 20 MG tablet Take 1 tablet (20 mg total) by mouth daily. Patient not taking: Reported on 11/28/2014 08/03/14   Bethann Berkshire, MD   BP 131/100 mmHg  Pulse 78  Temp(Src) 97.6 F (36.4 C) (Oral)  Resp 20  SpO2 99%  LMP 11/20/2014 (Exact Date) Physical Exam  Constitutional: She is oriented to person, place, and time.  Uncomfortable, tearful   HENT:  Head: Normocephalic.  Eyes: Conjunctivae are normal. Pupils are equal, round, and reactive to light.  Neck: Normal range of motion. Neck supple.  Cardiovascular: Normal rate, regular rhythm and normal heart sounds.   Pulmonary/Chest: Effort normal and breath sounds normal. No respiratory distress. She has no wheezes. She has no rales.  Abdominal: Soft. Bowel sounds are normal. She exhibits no distension. There is no tenderness. There is no rebound.  Musculoskeletal: Normal range of motion.  R 5th finger with redness lateral aspect of the nail with mild fluctuance. Mild tenderness over the fat pad as well. Good capillary refill.   Neurological: She is alert and oriented to person, place, and time.  Skin: Skin is warm and dry.  Psychiatric: She has a normal mood and affect. Her behavior is normal. Judgment and thought content normal.  Nursing note and vitals reviewed.   ED Course  Procedures (including critical care time)  INCISION AND DRAINAGE Performed by: Silverio Lay, DAVID Consent: Verbal consent obtained. Risks and benefits:  risks, benefits and alternatives were discussed Type: abscess  Body area: R 5th finger  Anesthesia: local infiltration  Incision was made with a scalpel.  Local anesthetic: lidocaine 2%  no epinephrine  Anesthetic total: 5 ml  Complexity: complex Blunt dissection to break up loculations  Drainage: purulent  Drainage amount: scant  Packing material: none  Patient tolerance: Patient tolerated the procedure well with no immediate complications.  Note: has a paronychia that was drained. Attempted a lateral incision but no purulent drainage came out.     Labs Review Labs Reviewed - No data to display  Imaging Review Dg Finger Little Right  12/01/2014   CLINICAL DATA:  Initial encounter for hand pain  EXAM: RIGHT LITTLE FINGER 2+V  COMPARISON:  None.  FINDINGS: There is no evidence of fracture or dislocation. There is no evidence of arthropathy or  other focal bone abnormality. Soft tissues are unremarkable.  IMPRESSION: Negative.   Electronically Signed   By: Kennith Center M.D.   On: 12/01/2014 14:14   Dg Hip Unilat  With Pelvis 2-3 Views Right  12/01/2014   CLINICAL DATA:  Patient slipped on wet floor and fell yesterday. Sharp pain with walking.  EXAM: RIGHT HIP (WITH PELVIS) 2-3 VIEWS  COMPARISON:  None.  FINDINGS: Frontal pelvis as well as frontal and lateral right hip images obtained. Frontal pelvis as well as frontal and lateral right hip images obtained. No fracture or dislocation. Joint spaces appear intact. No erosive change.  IMPRESSION: No fracture or dislocation.  No appreciable arthropathy.   Electronically Signed   By: Bretta Bang III M.D.   On: 12/01/2014 14:13     EKG Interpretation None      MDM   Final diagnoses:  None   BRAZIL PECORELLA is a 29 y.o. female here with R fifth finger pain and swelling. Likely paronychia vs felon. I performed digital block and was able to drain the paronychia. Also attempted a lateral incision given significant swelling  and redness but no purulent drainage came out. I doubt a felon. Has subjective fevers but afebrile here and vitals stable. Also hasn't vomited in the ED. Will give norco, clinda, also recommend warm soaks. Will have her see hand surgery.     Richardean Canal, MD 12/02/14 [id number]  Richardean Canal, MD 12/02/14 [phone]

## 2014-12-03 ENCOUNTER — Encounter (HOSPITAL_COMMUNITY): Payer: Self-pay | Admitting: Emergency Medicine

## 2014-12-03 ENCOUNTER — Emergency Department (HOSPITAL_COMMUNITY)
Admission: EM | Admit: 2014-12-03 | Discharge: 2014-12-03 | Disposition: A | Discharge status: Home / Self Care | Payer: Medicaid Other | Attending: Emergency Medicine | Admitting: Emergency Medicine

## 2014-12-03 DIAGNOSIS — E669 Obesity, unspecified: Secondary | ICD-10-CM | POA: Insufficient documentation

## 2014-12-03 DIAGNOSIS — L03011 Cellulitis of right finger: Secondary | ICD-10-CM

## 2014-12-03 DIAGNOSIS — Z79899 Other long term (current) drug therapy: Secondary | ICD-10-CM | POA: Insufficient documentation

## 2014-12-03 DIAGNOSIS — M79641 Pain in right hand: Secondary | ICD-10-CM | POA: Diagnosis present

## 2014-12-03 DIAGNOSIS — R509 Fever, unspecified: Secondary | ICD-10-CM | POA: Insufficient documentation

## 2014-12-03 DIAGNOSIS — Z8679 Personal history of other diseases of the circulatory system: Secondary | ICD-10-CM | POA: Diagnosis not present

## 2014-12-03 DIAGNOSIS — F329 Major depressive disorder, single episode, unspecified: Secondary | ICD-10-CM

## 2014-12-03 DIAGNOSIS — E119 Type 2 diabetes mellitus without complications: Secondary | ICD-10-CM | POA: Insufficient documentation

## 2014-12-03 DIAGNOSIS — Z8619 Personal history of other infectious and parasitic diseases: Secondary | ICD-10-CM | POA: Insufficient documentation

## 2014-12-03 DIAGNOSIS — Z3202 Encounter for pregnancy test, result negative: Secondary | ICD-10-CM | POA: Diagnosis not present

## 2014-12-03 DIAGNOSIS — Z720 Tobacco use: Secondary | ICD-10-CM

## 2014-12-03 DIAGNOSIS — Z87442 Personal history of urinary calculi: Secondary | ICD-10-CM

## 2014-12-03 DIAGNOSIS — Z87440 Personal history of urinary (tract) infections: Secondary | ICD-10-CM | POA: Insufficient documentation

## 2014-12-03 DIAGNOSIS — R112 Nausea with vomiting, unspecified: Secondary | ICD-10-CM

## 2014-12-03 DIAGNOSIS — R1110 Vomiting, unspecified: Secondary | ICD-10-CM | POA: Diagnosis not present

## 2014-12-03 DIAGNOSIS — G43909 Migraine, unspecified, not intractable, without status migrainosus: Secondary | ICD-10-CM | POA: Insufficient documentation

## 2014-12-03 DIAGNOSIS — M791 Myalgia: Secondary | ICD-10-CM

## 2014-12-03 LAB — CBC WITH DIFFERENTIAL/PLATELET
BASOS ABS: 0 10*3/uL (ref 0.0–0.1)
BASOS PCT: 0 % (ref 0–1)
EOS ABS: 0.3 10*3/uL (ref 0.0–0.7)
Eosinophils Relative: 4 % (ref 0–5)
HEMATOCRIT: 39.2 % (ref 36.0–46.0)
Hemoglobin: 12.3 g/dL (ref 12.0–15.0)
Lymphocytes Relative: 25 % (ref 12–46)
Lymphs Abs: 2.3 10*3/uL (ref 0.7–4.0)
MCH: 24.1 pg — ABNORMAL LOW (ref 26.0–34.0)
MCHC: 31.4 g/dL (ref 30.0–36.0)
MCV: 76.9 fL — AB (ref 78.0–100.0)
Monocytes Absolute: 0.6 10*3/uL (ref 0.1–1.0)
Monocytes Relative: 6 % (ref 3–12)
Neutro Abs: 5.9 10*3/uL (ref 1.7–7.7)
Neutrophils Relative %: 65 % (ref 43–77)
Platelets: 239 10*3/uL (ref 150–400)
RBC: 5.1 MIL/uL (ref 3.87–5.11)
RDW: 17.4 % — ABNORMAL HIGH (ref 11.5–15.5)
WBC: 9.1 10*3/uL (ref 4.0–10.5)

## 2014-12-03 LAB — BASIC METABOLIC PANEL
Anion gap: 7 (ref 5–15)
BUN: 6 mg/dL (ref 6–20)
CO2: 26 mmol/L (ref 22–32)
Calcium: 8.7 mg/dL — ABNORMAL LOW (ref 8.9–10.3)
Chloride: 107 mmol/L (ref 101–111)
Creatinine, Ser: 0.86 mg/dL (ref 0.44–1.00)
GFR calc Af Amer: >60 mL/min (ref >60)
GFR calc non Af Amer: >60 mL/min (ref >60)
GLUCOSE: 86 mg/dL (ref 65–99)
POTASSIUM: 3.4 mmol/L — AB (ref 3.5–5.1)
Sodium: 140 mmol/L (ref 135–145)

## 2014-12-03 LAB — I-STAT CG4 LACTIC ACID, ED: LACTIC ACID, VENOUS: 1.16 mmol/L (ref 0.5–2.0)

## 2014-12-03 LAB — I-STAT BETA HCG BLOOD, ED (MC, WL, AP ONLY): I-stat hCG, quantitative: <5 m[IU]/mL (ref <5)

## 2014-12-03 MED ORDER — ONDANSETRON HCL 4 MG/2ML IJ SOLN
4.0000 mg | Freq: Once | INTRAMUSCULAR | Status: AC
  Administered 2014-12-03: 4 mg via INTRAVENOUS
  Filled 2014-12-03: qty 2

## 2014-12-03 MED ORDER — VANCOMYCIN HCL IN DEXTROSE 1-5 GM/200ML-% IV SOLN
1000.0000 mg | Freq: Once | INTRAVENOUS | Status: AC
  Administered 2014-12-03: 1000 mg via INTRAVENOUS
  Filled 2014-12-03: qty 200

## 2014-12-03 MED ORDER — FENTANYL CITRATE (PF) 100 MCG/2ML IJ SOLN
50.0000 ug | Freq: Once | INTRAMUSCULAR | Status: AC
  Administered 2014-12-03: 50 ug via INTRAVENOUS
  Filled 2014-12-03: qty 2

## 2014-12-03 MED ORDER — HYDROCODONE-ACETAMINOPHEN 5-325 MG PO TABS
2.0000 | ORAL_TABLET | Freq: Once | ORAL | Status: AC
  Administered 2014-12-03: 2 via ORAL
  Filled 2014-12-03: qty 2

## 2014-12-03 NOTE — ED Notes (Addendum)
Patient refused Vital signs for discharge, Patient refused to sign or to accept her discharge instructions. Patient left yelling out that she was going to call her lawyer.

## 2014-12-03 NOTE — ED Notes (Signed)
Patient states she can take Vicodin without any problems, but it doesn't work so much now.

## 2014-12-03 NOTE — ED Notes (Addendum)
Pt complaint of emesis, fever, and hand pain related to recent treatment for infection of right pinky. Pt reports last took motrin at 1000.

## 2014-12-03 NOTE — ED Notes (Signed)
Right fifth finger marked.

## 2014-12-03 NOTE — ED Provider Notes (Signed)
TIME SEEN: 2:10 PM  CHIEF COMPLAINT: Right fifth finger pain  HPI: Pt is a 29 y.o. right-hand-dominant female with history of depression, bipolar disorder, non-insulin-dependent diabetes, obesity who presents to the emergency department with right fifth finger pain. Was seen in the emergency department on 12/01/14 and had a negative x-ray. Patient had some signs of infection at that time was started on Keflex. Was seen again on 12/02/14 and had a large paronychia that was drained on the ulnar aspect. Was started on clindamycin. States she has follow-up with Dr. Mina Marble on 628. States she has had subjective fever at home, vomiting. Took Motrin at 10 AM. Blood glucose has been well controlled. She has not noticed significant change in the erythema of this area. No drainage. No swelling. No history of injury.  ROS: See HPI Constitutional: no fever  Eyes: no drainage  ENT: no runny nose   Cardiovascular:  no chest pain  Resp: no SOB  GI: no vomiting GU: no dysuria Integumentary: no rash  Allergy: no hives  Musculoskeletal: no leg swelling  Neurological: no slurred speech ROS otherwise negative  PAST MEDICAL HISTORY/PAST SURGICAL HISTORY:  Past Medical History  Diagnosis Date  . Depression   . Bipolar 1 disorder, depressed   . PTSD (post-traumatic stress disorder)   . Kidney stone   . Migraine   . Urinary tract infection   . Pregnancy induced hypertension   . Obesity   . UTI (lower urinary tract infection)   . Diabetes mellitus without complication   . Hep C w/o coma, chronic     MEDICATIONS:  Prior to Admission medications   Medication Sig Start Date End Date Taking? Authorizing Provider  ALPRAZolam Prudy Feeler) 1 MG tablet Take 1 mg by mouth 3 (three) times daily as needed for anxiety (anxiety).     Historical Provider, MD  butalbital-acetaminophen-caffeine (FIORICET, ESGIC) 50-325-40 MG per tablet Take 1 tablet by mouth as needed for headache or migraine (headache).     Historical  Provider, MD  cephALEXin (KEFLEX) 500 MG capsule Take 1 capsule (500 mg total) by mouth 2 (two) times daily. For 7 days 12/01/14   Junius Finner, PA-C  clindamycin (CLEOCIN) 300 MG capsule Take 1 capsule (300 mg total) by mouth 3 (three) times daily. X 7 days 12/02/14   Richardean Canal, MD  diazepam (VALIUM) 5 MG tablet Take 1 tablet (5 mg total) by mouth 2 (two) times daily. Patient not taking: Reported on 12/02/2014 11/28/14   Catha Gosselin, PA-C  diphenhydrAMINE (SOMINEX) 25 MG tablet Take 25 mg by mouth 2 (two) times daily as needed for sleep (cold symptoms).    Historical Provider, MD  glimepiride (AMARYL) 4 MG tablet Take 4 mg by mouth daily with breakfast.    Historical Provider, MD  HYDROcodone-acetaminophen (NORCO) 10-325 MG per tablet Take 1 tablet by mouth every 6 (six) hours as needed (pain). 12/02/14   Richardean Canal, MD  ibuprofen (ADVIL,MOTRIN) 800 MG tablet Take 800 mg by mouth every 8 (eight) hours as needed for moderate pain (swelling).    Historical Provider, MD  methocarbamol (ROBAXIN) 500 MG tablet Take 1 tablet (500 mg total) by mouth 2 (two) times daily. 12/01/14   Junius Finner, PA-C  naproxen (NAPROSYN) 500 MG tablet Take 1 tablet (500 mg total) by mouth 2 (two) times daily. Patient not taking: Reported on 12/02/2014 11/28/14   Catha Gosselin, PA-C  Omega-3 Fatty Acids (FISH OIL PO) Take 1 capsule by mouth 3 (three) times daily.  Historical Provider, MD  ondansetron (ZOFRAN ODT) 4 MG disintegrating tablet  ODT q4 hours prn nausea/vomit Patient not taking: Reported on 11/28/2014 07/29/14   Purvis Sheffield, MD  ondansetron (ZOFRAN) 4 MG tablet Take 1 tablet (4 mg total) by mouth every 6 (six) hours. 11/28/14   Hanna Patel-Mills, PA-C  oxyCODONE-acetaminophen (PERCOCET/ROXICET) 5-325 MG per tablet Take 2 tablets by mouth every 4 (four) hours as needed for severe pain. Patient not taking: Reported on 12/02/2014 11/28/14   Catha Gosselin, PA-C  pantoprazole (PROTONIX) 20 MG tablet  Take 1 tablet (20 mg total) by mouth daily. Patient not taking: Reported on 11/28/2014 08/03/14   Bethann Berkshire, MD  Prenatal Vit-Fe Fumarate-FA (PRENATAL MULTIVITAMIN) TABS tablet Take 1 tablet by mouth every morning.     Historical Provider, MD    ALLERGIES:  Allergies  Allergen Reactions  . Acetaminophen Anaphylaxis  . Darvocet [Propoxyphene N-Acetaminophen] Hives and Nausea And Vomiting  . Morphine And Related Itching    Needs benadryl prior to administration-tolerates  . Sulfa Antibiotics Hives  . Tomato Hives  . Pioglitazone Swelling    Swelling of hands, legs and feet     SOCIAL HISTORY:  History  Substance Use Topics  . Smoking status: Current Every Day Smoker -- 1.00 packs/day    Types: Cigarettes  . Smokeless tobacco: Never Used  . Alcohol Use: No    FAMILY HISTORY: Family History  Problem Relation Age of Onset  . Rheum arthritis Mother   . Asthma Mother   . Hypertension Mother   . Other Neg Hx   . CAD Other     EXAM: BP 129/84 mmHg  Pulse 82  Temp(Src) 98 F (36.7 C) (Oral)  Resp 18  Ht  (1.702 m)  Wt 301 lb (136.533 kg)  BMI 47.13 kg/m2  SpO2 100%  LMP 11/20/2014 (Exact Date) CONSTITUTIONAL: Alert and oriented and responds appropriately to questions. Well-appearing; well-nourished, nontoxic, afebrile HEAD: Normocephalic EYES: Conjunctivae clear, PERRL ENT: normal nose; no rhinorrhea; moist mucous membranes; pharynx without lesions noted NECK: Supple, no meningismus, no LAD  CARD: RRR; S1 and S2 appreciated; no murmurs, no clicks, no rubs, no gallops RESP: Normal chest excursion without splinting or tachypnea; breath sounds clear and equal bilaterally; no wheezes, no rhonchi, no rales, no hypoxia or respiratory distress, speaking full sentences ABD/GI: Normal bowel sounds; non-distended; soft, non-tender, no rebound, no guarding, no peritoneal signs BACK:  The back appears normal and is non-tender to palpation, there is no CVA tenderness EXT:  Patient has a minimal amount of erythema to the dorsal and ulnar aspect of the right fifth digit but does not extend over the flexor tendons that she has no tenderness over the flexor tendons, no fluctuance, no sign of a felon, no induration, 2+ radial pulses bilaterally, normal capillary refill, no joint effusions, compartments are soft, patient has some pain with movement of this finger but when I am not at bedside patient is taxing on her cell phone and moving all 5 digits on the right hand rapidly without any obvious discomfort,  Normal ROM in all joints;  otherwise extremities are non-tender to palpation; no edema; normal capillary refill; no cyanosis, no calf tenderness or swelling    SKIN: Normal color for age and race; warm NEURO: Moves all extremities equally, sensation to light touch intact diffusely, cranial nerves II through XII intact PSYCH: The patient's mood and manner are appropriate. Grooming and personal hygiene are appropriate.  MEDICAL DECISION MAKING: Patient here with a mild  amount of cellulitis from recent. It can that was drained yesterday. She is hemolytic stable and afebrile. Labs have been obtained and she has no leukocytosis. Normal lactate. Blood glucose is 86. She is not pregnant. HCG is negative. We have outlined the area of cellulitis. She has no sign of a felon on exam or flexor tenosynovitis. She is neurovascularly intact distally. Have recommended that she continue her clindamycin. She did receive a dose of IV vancomycin in the ED. She has outpatient follow up with hand surgery and several days. Discussed return precautions.  She verbalized understanding and is comfortable with plan.       Layla Maw Deshawna Mcneece, DO 12/03/14 [phone]

## 2014-12-03 NOTE — Discharge Instructions (Signed)
Please continue your clindamycin. Your labs today were normal. Please follow-up with your hand surgeon as scheduled on Tuesday.   Cellulitis Cellulitis is an infection of the skin and the tissue beneath it. The infected area is usually red and tender. Cellulitis occurs most often in the arms and lower legs.  CAUSES  Cellulitis is caused by bacteria that enter the skin through cracks or cuts in the skin. The most common types of bacteria that cause cellulitis are staphylococci and streptococci. SIGNS AND SYMPTOMS   Redness and warmth.  Swelling.  Tenderness or pain.  Fever. DIAGNOSIS  Your health care provider can usually determine what is wrong based on a physical exam. Blood tests may also be done. TREATMENT  Treatment usually involves taking an antibiotic medicine. HOME CARE INSTRUCTIONS   Take your antibiotic medicine as directed by your health care provider. Finish the antibiotic even if you start to feel better.  Keep the infected arm or leg elevated to reduce swelling.  Apply a warm cloth to the affected area up to 4 times per day to relieve pain.  Take medicines only as directed by your health care provider.  Keep all follow-up visits as directed by your health care provider. SEEK MEDICAL CARE IF:   You notice red streaks coming from the infected area.  Your red area gets larger or turns dark in color.  Your bone or joint underneath the infected area becomes painful after the skin has healed.  Your infection returns in the same area or another area.  You notice a swollen bump in the infected area.  You develop new symptoms.  You have a fever. SEEK IMMEDIATE MEDICAL CARE IF:   You feel very sleepy.  You develop vomiting or diarrhea.  You have a general ill feeling (malaise) with muscle aches and pains. MAKE SURE YOU:   Understand these instructions.  Will watch your condition.  Will get help right away if you are not doing well or get worse. Document  Released: 03/07/2005 Document Revised: 10/12/2013 Document Reviewed: 08/13/2011 [facility] Patient Information 2015 [place], [place]. This information is not intended to replace advice given to you by your health care provider. Make sure you discuss any questions you have with your health care provider.   Paronychia Paronychia is an inflammatory reaction involving the folds of the skin surrounding the fingernail. This is commonly caused by an infection in the skin around a nail. The most common cause of paronychia is frequent wetting of the hands (as seen with bartenders, food servers, nurses or others who wet their hands). This makes the skin around the fingernail susceptible to infection by bacteria (germs) or fungus. Other predisposing factors are:  Aggressive manicuring.  Nail biting.  Thumb sucking. The most common cause is a staphylococcal (a type of germ) infection, or a fungal (Candida) infection. When caused by a germ, it usually comes on suddenly with redness, swelling, pus and is often painful. It may get under the nail and form an abscess (collection of pus), or form an abscess around the nail. If the nail itself is infected with a fungus, the treatment is usually prolonged and may require oral medicine for up to one year. Your caregiver will determine the length of time treatment is required. The paronychia caused by bacteria (germs) may largely be avoided by not pulling on hangnails or picking at cuticles. When the infection occurs at the tips of the finger it is called felon. When the cause of paronychia is from the  herpes simplex virus (HSV) it is called herpetic whitlow. TREATMENT  When an abscess is present treatment is often incision and drainage. This means that the abscess must be cut open so the pus can get out. When this is done, the following home care instructions should be followed. HOME CARE INSTRUCTIONS   It is important to keep the affected fingers very dry. Rubber or  plastic gloves over cotton gloves should be used whenever the hand must be placed in water.  Keep wound clean, dry and dressed as suggested by your caregiver between warm soaks or warm compresses.  Soak in warm water for fifteen to twenty minutes three to four times per day for bacterial infections. Fungal infections are very difficult to treat, so often require treatment for long periods of time.  For bacterial (germ) infections take antibiotics (medicine which kill germs) as directed and finish the prescription, even if the problem appears to be solved before the medicine is gone.  Only take over-the-counter or prescription medicines for pain, discomfort, or fever as directed by your caregiver. SEEK IMMEDIATE MEDICAL CARE IF:  You have redness, swelling, or increasing pain in the wound.  You notice pus coming from the wound.  You have a fever.  You notice a bad smell coming from the wound or dressing. Document Released: 11/21/2000 Document Revised: 08/20/2011 Document Reviewed: 07/23/2008 [facility] Patient Information 2015 [place], [place]. This information is not intended to replace advice given to you by your health care provider. Make sure you discuss any questions you have with your health care provider.

## 2014-12-04 ENCOUNTER — Encounter (HOSPITAL_COMMUNITY): Payer: Self-pay | Admitting: Emergency Medicine

## 2014-12-04 ENCOUNTER — Emergency Department (HOSPITAL_COMMUNITY)
Admission: EM | Admit: 2014-12-04 | Discharge: 2014-12-04 | Disposition: A | Discharge status: Home / Self Care | Payer: Medicaid Other | Source: Home / Self Care | Attending: Emergency Medicine | Admitting: Emergency Medicine

## 2014-12-04 DIAGNOSIS — R112 Nausea with vomiting, unspecified: Secondary | ICD-10-CM

## 2014-12-04 DIAGNOSIS — M791 Myalgia, unspecified site: Secondary | ICD-10-CM

## 2014-12-04 LAB — CBC WITH DIFFERENTIAL/PLATELET
Basophils Absolute: 0 10*3/uL (ref 0.0–0.1)
Basophils Relative: 0 % (ref 0–1)
EOS ABS: 0.2 10*3/uL (ref 0.0–0.7)
EOS PCT: 3 % (ref 0–5)
HCT: 38.5 % (ref 36.0–46.0)
HEMOGLOBIN: 12.1 g/dL (ref 12.0–15.0)
LYMPHS ABS: 2.6 10*3/uL (ref 0.7–4.0)
Lymphocytes Relative: 32 % (ref 12–46)
MCH: 24.2 pg — ABNORMAL LOW (ref 26.0–34.0)
MCHC: 31.4 g/dL (ref 30.0–36.0)
MCV: 77 fL — ABNORMAL LOW (ref 78.0–100.0)
MONO ABS: 0.4 10*3/uL (ref 0.1–1.0)
Monocytes Relative: 5 % (ref 3–12)
NEUTROS PCT: 60 % (ref 43–77)
Neutro Abs: 5.1 10*3/uL (ref 1.7–7.7)
Platelets: 218 10*3/uL (ref 150–400)
RBC: 5 MIL/uL (ref 3.87–5.11)
RDW: 17.5 % — ABNORMAL HIGH (ref 11.5–15.5)
WBC: 8.3 10*3/uL (ref 4.0–10.5)

## 2014-12-04 LAB — I-STAT CREATININE, ED: CREATININE: 0.9 mg/dL (ref 0.44–1.00)

## 2014-12-04 MED ORDER — KETOROLAC TROMETHAMINE 30 MG/ML IJ SOLN
30.0000 mg | Freq: Once | INTRAMUSCULAR | Status: AC
  Administered 2014-12-04: 30 mg via INTRAVENOUS
  Filled 2014-12-04: qty 1

## 2014-12-04 MED ORDER — KETOROLAC TROMETHAMINE 60 MG/2ML IM SOLN: 60.0000 mg | Freq: Once | INTRAMUSCULAR | Status: DC

## 2014-12-04 MED ORDER — CEPHALEXIN 500 MG PO CAPS: 500.0000 mg | ORAL_CAPSULE | Freq: Four times a day (QID) | ORAL | Status: DC

## 2014-12-04 MED ORDER — METOCLOPRAMIDE HCL 5 MG/ML IJ SOLN
10.0000 mg | Freq: Once | INTRAMUSCULAR | Status: AC
  Administered 2014-12-04: 10 mg via INTRAVENOUS
  Filled 2014-12-04: qty 2

## 2014-12-04 MED ORDER — NAPROXEN 500 MG PO TABS: 500.0000 mg | ORAL_TABLET | Freq: Two times a day (BID) | ORAL | Status: DC
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
## 2014-12-04 NOTE — ED Notes (Signed)
Pt is seeing an orthopedic specialist for a wound on her right pinky finger and has been receiving antibiotics for it

## 2014-12-04 NOTE — ED Notes (Signed)
Pt from home states that she has had nausea and vomiting for the past week. She has generalized pain. She was here earlier for the same thing and left AMA

## 2014-12-04 NOTE — Discharge Instructions (Signed)
YOU CAN RESTART YOUR KEFLEX ANTIBIOTIC AND STOP TAKING CLINDAMYCIN TO AVOID FURTHER NAUSEA AND VOMITING. CONTINUE YOUR REGULAR PAIN MEDICATION AND TAKE NAPROXEN AS WELL TO CONTROL PAIN. FOLLOW UP WITH PRIMARY CARE FOR FURTHER SYMPTOMS AND KEEP YOUR APPOINTMENT WITH THE HAND DOCTOR FOR NEXT WEEK AS WELL.

## 2014-12-09 ENCOUNTER — Emergency Department (HOSPITAL_COMMUNITY)
Admission: EM | Admit: 2014-12-09 | Discharge: 2014-12-10 | Disposition: A | Discharge status: Other Acute Inpatient Hospital | Payer: Medicaid Other | Attending: Emergency Medicine | Admitting: Emergency Medicine

## 2014-12-09 ENCOUNTER — Encounter (HOSPITAL_COMMUNITY): Payer: Self-pay | Admitting: Emergency Medicine

## 2014-12-09 DIAGNOSIS — S3992XA Unspecified injury of lower back, initial encounter: Secondary | ICD-10-CM | POA: Diagnosis present

## 2014-12-09 DIAGNOSIS — Z79899 Other long term (current) drug therapy: Secondary | ICD-10-CM | POA: Diagnosis not present

## 2014-12-09 DIAGNOSIS — Y9389 Activity, other specified: Secondary | ICD-10-CM | POA: Insufficient documentation

## 2014-12-09 DIAGNOSIS — R110 Nausea: Secondary | ICD-10-CM

## 2014-12-09 DIAGNOSIS — F1920 Other psychoactive substance dependence, uncomplicated: Secondary | ICD-10-CM | POA: Diagnosis present

## 2014-12-09 DIAGNOSIS — N946 Dysmenorrhea, unspecified: Secondary | ICD-10-CM | POA: Insufficient documentation

## 2014-12-09 DIAGNOSIS — F1420 Cocaine dependence, uncomplicated: Secondary | ICD-10-CM | POA: Diagnosis present

## 2014-12-09 DIAGNOSIS — S0990XA Unspecified injury of head, initial encounter: Secondary | ICD-10-CM | POA: Diagnosis not present

## 2014-12-09 DIAGNOSIS — F1320 Sedative, hypnotic or anxiolytic dependence, uncomplicated: Secondary | ICD-10-CM | POA: Diagnosis not present

## 2014-12-09 DIAGNOSIS — F332 Major depressive disorder, recurrent severe without psychotic features: Secondary | ICD-10-CM | POA: Diagnosis not present

## 2014-12-09 DIAGNOSIS — Y998 Other external cause status: Secondary | ICD-10-CM | POA: Insufficient documentation

## 2014-12-09 DIAGNOSIS — M5442 Lumbago with sciatica, left side: Secondary | ICD-10-CM | POA: Insufficient documentation

## 2014-12-09 DIAGNOSIS — R45851 Suicidal ideations: Secondary | ICD-10-CM

## 2014-12-09 DIAGNOSIS — F1220 Cannabis dependence, uncomplicated: Secondary | ICD-10-CM | POA: Insufficient documentation

## 2014-12-09 DIAGNOSIS — Z87440 Personal history of urinary (tract) infections: Secondary | ICD-10-CM | POA: Diagnosis not present

## 2014-12-09 DIAGNOSIS — E119 Type 2 diabetes mellitus without complications: Secondary | ICD-10-CM | POA: Diagnosis not present

## 2014-12-09 DIAGNOSIS — Z8619 Personal history of other infectious and parasitic diseases: Secondary | ICD-10-CM | POA: Insufficient documentation

## 2014-12-09 DIAGNOSIS — Y92008 Other place in unspecified non-institutional (private) residence as the place of occurrence of the external cause: Secondary | ICD-10-CM | POA: Insufficient documentation

## 2014-12-09 DIAGNOSIS — Z87442 Personal history of urinary calculi: Secondary | ICD-10-CM | POA: Insufficient documentation

## 2014-12-09 DIAGNOSIS — R45850 Homicidal ideations: Secondary | ICD-10-CM

## 2014-12-09 DIAGNOSIS — Z792 Long term (current) use of antibiotics: Secondary | ICD-10-CM | POA: Insufficient documentation

## 2014-12-09 DIAGNOSIS — Z3202 Encounter for pregnancy test, result negative: Secondary | ICD-10-CM | POA: Diagnosis not present

## 2014-12-09 DIAGNOSIS — E669 Obesity, unspecified: Secondary | ICD-10-CM | POA: Diagnosis not present

## 2014-12-09 DIAGNOSIS — F1120 Opioid dependence, uncomplicated: Secondary | ICD-10-CM | POA: Insufficient documentation

## 2014-12-09 DIAGNOSIS — Z720 Tobacco use: Secondary | ICD-10-CM | POA: Insufficient documentation

## 2014-12-09 MED ORDER — CYCLOBENZAPRINE HCL 5 MG PO TABS: 10.0000 mg | ORAL_TABLET | Freq: Three times a day (TID) | ORAL | Status: DC

## 2014-12-09 MED ORDER — IBUPROFEN 800 MG PO TABS
800.0000 mg | ORAL_TABLET | Freq: Once | ORAL | Status: AC
  Administered 2014-12-09: 800 mg via ORAL
  Filled 2014-12-09: qty 1

## 2014-12-09 MED ORDER — IBUPROFEN 800 MG PO TABS: 800.0000 mg | ORAL_TABLET | Freq: Three times a day (TID) | ORAL | Status: DC

## 2014-12-09 MED ORDER — ONDANSETRON HCL 4 MG PO TABS: 4.0000 mg | ORAL_TABLET | Freq: Three times a day (TID) | ORAL | Status: DC | PRN

## 2014-12-09 MED ORDER — NICOTINE 21 MG/24HR TD PT24
21.0000 mg | MEDICATED_PATCH | Freq: Every day | TRANSDERMAL | Status: DC
  Administered 2014-12-10: 21 mg via TRANSDERMAL
  Filled 2014-12-09: qty 1

## 2014-12-09 MED ORDER — LORAZEPAM 0.5 MG PO TABS
1.0000 mg | ORAL_TABLET | Freq: Three times a day (TID) | ORAL | Status: DC | PRN
  Administered 2014-12-10: 1 mg via ORAL
  Filled 2014-12-09: qty 2

## 2014-12-09 MED ORDER — ONDANSETRON 8 MG PO TBDP: 8.0000 mg | ORAL_TABLET | Freq: Three times a day (TID) | ORAL | Status: DC | PRN

## 2014-12-09 MED ORDER — ONDANSETRON 8 MG PO TBDP
8.0000 mg | ORAL_TABLET | Freq: Once | ORAL | Status: AC
  Administered 2014-12-09: 8 mg via ORAL
  Filled 2014-12-09: qty 1

## 2014-12-09 MED ORDER — CYCLOBENZAPRINE HCL 10 MG PO TABS
5.0000 mg | ORAL_TABLET | Freq: Once | ORAL | Status: AC
  Administered 2014-12-09: 5 mg via ORAL
  Filled 2014-12-09: qty 1

## 2014-12-09 NOTE — ED Notes (Signed)
PA to bedside, patient now endorses SI.

## 2014-12-09 NOTE — ED Notes (Signed)
Pt came out to the nurses desk requesting to talk to someone about her depression, pt is tearful and stating that she doesn't want to go home because he will be there.

## 2014-12-09 NOTE — Discharge Instructions (Signed)
Assault, General Assault includes any behavior, whether intentional or reckless, which results in bodily injury to another person and/or damage to property. Included in this would be any behavior, intentional or reckless, that by its nature would be understood (interpreted) by a reasonable person as intent to harm another person or to damage his/her property. Threats may be oral or written. They may be communicated through regular mail, computer, fax, or phone. These threats may be direct or implied. FORMS OF ASSAULT INCLUDE:  Physically assaulting a person. This includes physical threats to inflict physical harm as well as:  Slapping.  Hitting.  Poking.  Kicking.  Punching.  Pushing.  Arson.  Sabotage.  Equipment vandalism.  Damaging or destroying property.  Throwing or hitting objects.  Displaying a weapon or an object that appears to be a weapon in a threatening manner.  Carrying a firearm of any kind.  Using a weapon to harm someone.  Using greater physical size/strength to intimidate another.  Making intimidating or threatening gestures.  Bullying.  Hazing.  Intimidating, threatening, hostile, or abusive language directed toward another person.  It communicates the intention to engage in violence against that person. And it leads a reasonable person to expect that violent behavior may occur.  Stalking another person. IF IT HAPPENS AGAIN:  Immediately call for emergency help (911 in [place]  If someone poses clear and immediate danger to you, seek legal authorities to have a protective or restraining order put in place.  Less threatening assaults can at least be reported to authorities. STEPS TO TAKE IF A SEXUAL ASSAULT HAS HAPPENED  Go to an area of safety. This may include a shelter or staying with a friend. Stay away from the area where you have been attacked. A large percentage of sexual assaults are caused by a friend, relative or associate.  If  medications were given by your caregiver, take them as directed for the full length of time prescribed.  Only take over-the-counter or prescription medicines for pain, discomfort, or fever as directed by your caregiver.  If you have come in contact with a sexual disease, find out if you are to be tested again. If your caregiver is concerned about the HIV/AIDS virus, he/she may require you to have continued testing for several months.  For the protection of your privacy, test results can not be given over the phone. Make sure you receive the results of your test. If your test results are not back during your visit, make an appointment with your caregiver to find out the results. Do not assume everything is normal if you have not heard from your caregiver or the medical facility. It is important for you to follow up on all of your test results.  File appropriate papers with authorities. This is important in all assaults, even if it has occurred in a family or by a friend. SEEK MEDICAL CARE IF:  You have new problems because of your injuries.  You have problems that may be because of the medicine you are taking, such as:  Rash.  Itching.  Swelling.  Trouble breathing.  You develop belly (abdominal) pain, feel sick to your stomach (nausea) or are vomiting.  You begin to run a temperature.  You need supportive care or referral to a rape crisis center. These are centers with trained personnel who can help you get through this ordeal. SEEK IMMEDIATE MEDICAL CARE IF:  You are afraid of being threatened, beaten, or abused. In [place] call 911.  You  receive new injuries related to abuse.  You develop severe pain in any area injured in the assault or have any change in your condition that concerns you.  You faint or lose consciousness.  You develop chest pain or shortness of breath. Document Released: 05/28/2005 Document Revised: 08/20/2011 Document Reviewed: 01/14/2008 [facility] Patient  Information 2015 [place], [place]. This information is not intended to replace advice given to you by your health care provider. Make sure you discuss any questions you have with your health care provider.  Back Exercises Back exercises help treat and prevent back injuries. The goal of back exercises is to increase the strength of your abdominal and back muscles and the flexibility of your back. These exercises should be started when you no longer have back pain. Back exercises include:  Pelvic Tilt. Lie on your back with your knees bent. Tilt your pelvis until the lower part of your back is against the floor. Hold this position 5 to 10 sec and repeat 5 to 10 times.  Knee to Chest. Pull first 1 knee up against your chest and hold for 20 to 30 seconds, repeat this with the other knee, and then both knees. This may be done with the other leg straight or bent, whichever feels better.  Sit-Ups or Curl-Ups. Bend your knees 90 degrees. Start with tilting your pelvis, and do a partial, slow sit-up, lifting your trunk only 30 to 45 degrees off the floor. Take at least 2 to 3 seconds for each sit-up. Do not do sit-ups with your knees out straight. If partial sit-ups are difficult, simply do the above but with only tightening your abdominal muscles and holding it as directed.  Hip-Lift. Lie on your back with your knees flexed 90 degrees. Push down with your feet and shoulders as you raise your hips a couple inches off the floor; hold for 10 seconds, repeat 5 to 10 times.  Back arches. Lie on your stomach, propping yourself up on bent elbows. Slowly press on your hands, causing an arch in your low back. Repeat 3 to 5 times. Any initial stiffness and discomfort should lessen with repetition over time.  Shoulder-Lifts. Lie face down with arms beside your body. Keep hips and torso pressed to floor as you slowly lift your head and shoulders off the floor. Do not overdo your exercises, especially in the beginning.  Exercises may cause you some mild back discomfort which lasts for a few minutes; however, if the pain is more severe, or lasts for more than 15 minutes, do not continue exercises until you see your caregiver. Improvement with exercise therapy for back problems is slow.  See your caregivers for assistance with developing a proper back exercise program. Document Released: 07/05/2004 Document Revised: 08/20/2011 Document Reviewed: 03/29/2011 [facility]Exit[facility] Patient Information 2015 [employer], [place]. This information is not intended to replace advice given to you by your health care provider. Make sure you discuss any questions you have with your health care provider. Nausea and Vomiting Nausea is a sick feeling that often comes before throwing up (vomiting). Vomiting is a reflex where stomach contents come out of your mouth. Vomiting can cause severe loss of body fluids (dehydration). Children and elderly adults can become dehydrated quickly, especially if they also have diarrhea. Nausea and vomiting are symptoms of a condition or disease. It is important to find the cause of your symptoms. CAUSES   Direct irritation of the stomach lining. This irritation can result from increased acid production (gastroesophageal reflux disease), infection,  food poisoning, taking certain medicines (such as nonsteroidal anti-inflammatory drugs), alcohol use, or tobacco use.  Signals from the brain.These signals could be caused by a headache, heat exposure, an inner ear disturbance, increased pressure in the brain from injury, infection, a tumor, or a concussion, pain, emotional stimulus, or metabolic problems.  An obstruction in the gastrointestinal tract (bowel obstruction).  Illnesses such as diabetes, hepatitis, gallbladder problems, appendicitis, kidney problems, cancer, sepsis, atypical symptoms of a heart attack, or eating disorders.  Medical treatments such as chemotherapy and radiation.  Receiving medicine that makes  you sleep (general anesthetic) during surgery. DIAGNOSIS Your caregiver may ask for tests to be done if the problems do not improve after a few days. Tests may also be done if symptoms are severe or if the reason for the nausea and vomiting is not clear. Tests may include:  Urine tests.  Blood tests.  Stool tests.  Cultures (to look for evidence of infection).  X-rays or other imaging studies. Test results can help your caregiver make decisions about treatment or the need for additional tests. TREATMENT You need to stay well hydrated. Drink frequently but in small amounts.You may wish to drink water, sports drinks, clear broth, or eat frozen ice pops or gelatin dessert to help stay hydrated.When you eat, eating slowly may help prevent nausea.There are also some antinausea medicines that may help prevent nausea. HOME CARE INSTRUCTIONS   Take all medicine as directed by your caregiver.  If you do not have an appetite, do not force yourself to eat. However, you must continue to drink fluids.  If you have an appetite, eat a normal diet unless your caregiver tells you differently.  Eat a variety of complex carbohydrates (rice, wheat, potatoes, bread), lean meats, yogurt, fruits, and vegetables.  Avoid high-fat foods because they are more difficult to digest.  Drink enough water and fluids to keep your urine clear or pale yellow.  If you are dehydrated, ask your caregiver for specific rehydration instructions. Signs of dehydration may include:  Severe thirst.  Dry lips and mouth.  Dizziness.  Dark urine.  Decreasing urine frequency and amount.  Confusion.  Rapid breathing or pulse. SEEK IMMEDIATE MEDICAL CARE IF:   You have blood or brown flecks (like coffee grounds) in your vomit.  You have black or bloody stools.  You have a severe headache or stiff neck.  You are confused.  You have severe abdominal pain.  You have chest pain or trouble breathing.  You do  not urinate at least once every 8 hours.  You develop cold or clammy skin.  You continue to vomit for longer than 24 to 48 hours.  You have a fever. MAKE SURE YOU:   Understand these instructions.  Will watch your condition.  Will get help right away if you are not doing well or get worse. Document Released: 05/28/2005 Document Revised: 08/20/2011 Document Reviewed: 10/25/2010 [facility] Patient Information 2015 [place], [place]. This information is not intended to replace advice given to you by your health care provider. Make sure you discuss any questions you have with your health care provider.
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
## 2014-12-09 NOTE — ED Notes (Signed)
Patient belongings wanded by security
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
## 2014-12-10 ENCOUNTER — Encounter (HOSPITAL_COMMUNITY): Payer: Self-pay | Admitting: *Deleted

## 2014-12-10 ENCOUNTER — Inpatient Hospital Stay (HOSPITAL_COMMUNITY)
Admission: EM | Admit: 2014-12-10 | Discharge: 2014-12-11 | DRG: 885 | Disposition: A | Discharge status: Home / Self Care | Payer: Medicaid Other | Source: Intra-hospital | Attending: Psychiatry | Admitting: Psychiatry

## 2014-12-10 DIAGNOSIS — F332 Major depressive disorder, recurrent severe without psychotic features: Secondary | ICD-10-CM | POA: Diagnosis not present

## 2014-12-10 DIAGNOSIS — R45851 Suicidal ideations: Secondary | ICD-10-CM | POA: Diagnosis present

## 2014-12-10 DIAGNOSIS — R45850 Homicidal ideations: Secondary | ICD-10-CM | POA: Diagnosis present

## 2014-12-10 DIAGNOSIS — F1420 Cocaine dependence, uncomplicated: Secondary | ICD-10-CM | POA: Diagnosis present

## 2014-12-10 DIAGNOSIS — E119 Type 2 diabetes mellitus without complications: Secondary | ICD-10-CM | POA: Diagnosis present

## 2014-12-10 DIAGNOSIS — F1920 Other psychoactive substance dependence, uncomplicated: Secondary | ICD-10-CM | POA: Diagnosis present

## 2014-12-10 DIAGNOSIS — F4310 Post-traumatic stress disorder, unspecified: Secondary | ICD-10-CM | POA: Diagnosis present

## 2014-12-10 DIAGNOSIS — F17210 Nicotine dependence, cigarettes, uncomplicated: Secondary | ICD-10-CM | POA: Diagnosis present

## 2014-12-10 DIAGNOSIS — F329 Major depressive disorder, single episode, unspecified: Secondary | ICD-10-CM | POA: Diagnosis present

## 2014-12-10 DIAGNOSIS — S3992XA Unspecified injury of lower back, initial encounter: Secondary | ICD-10-CM | POA: Diagnosis not present

## 2014-12-10 LAB — CBC WITH DIFFERENTIAL/PLATELET
Basophils Absolute: 0 10*3/uL (ref 0.0–0.1)
Basophils Relative: 0 % (ref 0–1)
Eosinophils Absolute: 0.2 10*3/uL (ref 0.0–0.7)
Eosinophils Relative: 2 % (ref 0–5)
HCT: 41.3 % (ref 36.0–46.0)
HEMOGLOBIN: 13.5 g/dL (ref 12.0–15.0)
LYMPHS ABS: 3.5 10*3/uL (ref 0.7–4.0)
LYMPHS PCT: 31 % (ref 12–46)
MCH: 25.3 pg — ABNORMAL LOW (ref 26.0–34.0)
MCHC: 32.7 g/dL (ref 30.0–36.0)
MCV: 77.3 fL — AB (ref 78.0–100.0)
Monocytes Absolute: 0.6 10*3/uL (ref 0.1–1.0)
Monocytes Relative: 5 % (ref 3–12)
Neutro Abs: 7.2 10*3/uL (ref 1.7–7.7)
Neutrophils Relative %: 62 % (ref 43–77)
Platelets: 253 10*3/uL (ref 150–400)
RBC: 5.34 MIL/uL — AB (ref 3.87–5.11)
RDW: 17.3 % — ABNORMAL HIGH (ref 11.5–15.5)
WBC: 11.6 10*3/uL — ABNORMAL HIGH (ref 4.0–10.5)

## 2014-12-10 LAB — COMPREHENSIVE METABOLIC PANEL
ALT: 38 U/L (ref 14–54)
AST: 24 U/L (ref 15–41)
Albumin: 3.8 g/dL (ref 3.5–5.0)
Alkaline Phosphatase: 57 U/L (ref 38–126)
Anion gap: 9 (ref 5–15)
BILIRUBIN TOTAL: 0.6 mg/dL (ref 0.3–1.2)
BUN: 6 mg/dL (ref 6–20)
CO2: 24 mmol/L (ref 22–32)
CREATININE: 0.84 mg/dL (ref 0.44–1.00)
Calcium: 9 mg/dL (ref 8.9–10.3)
Chloride: 106 mmol/L (ref 101–111)
GFR calc Af Amer: >60 mL/min (ref >60)
GFR calc non Af Amer: >60 mL/min (ref >60)
Glucose, Bld: 82 mg/dL (ref 65–99)
POTASSIUM: 3.4 mmol/L — AB (ref 3.5–5.1)
Sodium: 139 mmol/L (ref 135–145)
Total Protein: 8 g/dL (ref 6.5–8.1)

## 2014-12-10 LAB — RAPID URINE DRUG SCREEN, HOSP PERFORMED
AMPHETAMINES: NOT DETECTED
Barbiturates: NOT DETECTED
Benzodiazepines: POSITIVE — AB
COCAINE: POSITIVE — AB
OPIATES: POSITIVE — AB
TETRAHYDROCANNABINOL: POSITIVE — AB

## 2014-12-10 LAB — ETHANOL

## 2014-12-10 LAB — PREGNANCY, URINE: PREG TEST UR: NEGATIVE

## 2014-12-10 LAB — CBG MONITORING, ED: Glucose-Capillary: 114 mg/dL — ABNORMAL HIGH (ref 65–99)

## 2014-12-10 MED ORDER — LORAZEPAM 1 MG PO TABS: 1.0000 mg | ORAL_TABLET | Freq: Two times a day (BID) | ORAL | Status: DC

## 2014-12-10 MED ORDER — TRAMADOL HCL 50 MG PO TABS
100.0000 mg | ORAL_TABLET | Freq: Four times a day (QID) | ORAL | Status: DC | PRN
  Administered 2014-12-10 – 2014-12-11 (×4): 100 mg via ORAL
  Filled 2014-12-10 (×4): qty 2

## 2014-12-10 MED ORDER — ADULT MULTIVITAMIN W/MINERALS CH
1.0000 | ORAL_TABLET | Freq: Every day | ORAL | Status: DC
  Administered 2014-12-10 – 2014-12-11 (×2): 1 via ORAL
  Filled 2014-12-10 (×4): qty 1

## 2014-12-10 MED ORDER — LORAZEPAM 1 MG PO TABS
1.0000 mg | ORAL_TABLET | Freq: Four times a day (QID) | ORAL | Status: DC | PRN
  Administered 2014-12-10 – 2014-12-11 (×3): 1 mg via ORAL
  Filled 2014-12-10 (×3): qty 1

## 2014-12-10 MED ORDER — LORAZEPAM 1 MG PO TABS
1.0000 mg | ORAL_TABLET | Freq: Every day | ORAL | Status: DC
Start: 2014-12-14 — End: 2014-12-11

## 2014-12-10 MED ORDER — LORAZEPAM 1 MG PO TABS
1.0000 mg | ORAL_TABLET | Freq: Four times a day (QID) | ORAL | Status: AC
  Administered 2014-12-10 – 2014-12-11 (×4): 1 mg via ORAL
  Filled 2014-12-10 (×3): qty 1

## 2014-12-10 MED ORDER — LORAZEPAM 1 MG PO TABS
1.0000 mg | ORAL_TABLET | Freq: Three times a day (TID) | ORAL | Status: DC
  Filled 2014-12-10: qty 1

## 2014-12-10 MED ORDER — GLIMEPIRIDE 4 MG PO TABS
4.0000 mg | ORAL_TABLET | Freq: Every day | ORAL | Status: DC
  Filled 2014-12-10: qty 1

## 2014-12-10 MED ORDER — IBUPROFEN 800 MG PO TABS
800.0000 mg | ORAL_TABLET | Freq: Three times a day (TID) | ORAL | Status: DC | PRN
  Administered 2014-12-10 – 2014-12-11 (×2): 800 mg via ORAL
  Filled 2014-12-10 (×2): qty 1

## 2014-12-10 MED ORDER — NICOTINE 21 MG/24HR TD PT24
21.0000 mg | MEDICATED_PATCH | Freq: Every day | TRANSDERMAL | Status: DC
  Filled 2014-12-10 (×4): qty 1

## 2014-12-10 MED ORDER — LORAZEPAM 1 MG PO TABS: 1.0000 mg | ORAL_TABLET | Freq: Four times a day (QID) | ORAL | Status: DC | PRN

## 2014-12-10 MED ORDER — THIAMINE HCL 100 MG/ML IJ SOLN: 100.0000 mg | Freq: Once | INTRAMUSCULAR | Status: DC

## 2014-12-10 MED ORDER — ALUM & MAG HYDROXIDE-SIMETH 200-200-20 MG/5ML PO SUSP: 30.0000 mL | ORAL | Status: DC | PRN

## 2014-12-10 MED ORDER — GLIMEPIRIDE 2 MG PO TABS
2.0000 mg | ORAL_TABLET | Freq: Every day | ORAL | Status: DC
  Administered 2014-12-11: 2 mg via ORAL
  Filled 2014-12-10 (×3): qty 1

## 2014-12-10 MED ORDER — LOPERAMIDE HCL 2 MG PO CAPS: 2.0000 mg | ORAL_CAPSULE | ORAL | Status: DC | PRN

## 2014-12-10 MED ORDER — ONDANSETRON 4 MG PO TBDP
4.0000 mg | ORAL_TABLET | Freq: Four times a day (QID) | ORAL | Status: DC | PRN
Start: 2014-12-10 — End: 2014-12-10

## 2014-12-10 MED ORDER — ONDANSETRON HCL 4 MG PO TABS
4.0000 mg | ORAL_TABLET | Freq: Three times a day (TID) | ORAL | Status: DC | PRN
  Administered 2014-12-11 (×2): 4 mg via ORAL
  Filled 2014-12-10 (×2): qty 1

## 2014-12-10 MED ORDER — IBUPROFEN 800 MG PO TABS
800.0000 mg | ORAL_TABLET | Freq: Three times a day (TID) | ORAL | Status: DC | PRN
  Administered 2014-12-10: 800 mg via ORAL
  Filled 2014-12-10: qty 1

## 2014-12-10 MED ORDER — LURASIDONE HCL 40 MG PO TABS
20.0000 mg | ORAL_TABLET | Freq: Every day | ORAL | Status: DC
  Administered 2014-12-11: 20 mg via ORAL
  Filled 2014-12-10 (×3): qty 1

## 2014-12-10 MED ORDER — MAGNESIUM HYDROXIDE 400 MG/5ML PO SUSP: 30.0000 mL | Freq: Every day | ORAL | Status: DC | PRN

## 2014-12-10 MED ORDER — METHOCARBAMOL 750 MG PO TABS
750.0000 mg | ORAL_TABLET | Freq: Four times a day (QID) | ORAL | Status: DC | PRN
  Administered 2014-12-10 – 2014-12-11 (×4): 750 mg via ORAL
  Filled 2014-12-10 (×4): qty 1

## 2014-12-10 MED ORDER — TRAMADOL HCL 50 MG PO TABS: 50.0000 mg | ORAL_TABLET | Freq: Four times a day (QID) | ORAL | Status: DC | PRN

## 2014-12-10 MED ORDER — ATENOLOL 25 MG PO TABS
25.0000 mg | ORAL_TABLET | Freq: Every day | ORAL | Status: DC
Start: 2014-12-10 — End: 2014-12-11
  Administered 2014-12-10 – 2014-12-11 (×2): 25 mg via ORAL
  Filled 2014-12-10 (×5): qty 1

## 2014-12-10 MED ORDER — HYDROXYZINE HCL 25 MG PO TABS
25.0000 mg | ORAL_TABLET | Freq: Four times a day (QID) | ORAL | Status: DC | PRN
  Administered 2014-12-10 – 2014-12-11 (×4): 25 mg via ORAL
  Filled 2014-12-10 (×4): qty 1

## 2014-12-10 MED ORDER — NICOTINE POLACRILEX 2 MG MT GUM
2.0000 mg | CHEWING_GUM | OROMUCOSAL | Status: DC | PRN
  Administered 2014-12-10 – 2014-12-11 (×3): 2 mg via ORAL
  Filled 2014-12-10 (×3): qty 1

## 2014-12-10 MED ORDER — ZOLPIDEM TARTRATE 10 MG PO TABS: 10.0000 mg | ORAL_TABLET | Freq: Every evening | ORAL | Status: DC | PRN

## 2014-12-10 MED ORDER — VITAMIN B-1 100 MG PO TABS
100.0000 mg | ORAL_TABLET | Freq: Every day | ORAL | Status: DC
  Administered 2014-12-11: 100 mg via ORAL
  Filled 2014-12-10 (×3): qty 1

## 2014-12-10 MED ORDER — ZOLPIDEM TARTRATE 5 MG PO TABS
5.0000 mg | ORAL_TABLET | Freq: Every evening | ORAL | Status: DC | PRN
  Administered 2014-12-10: 5 mg via ORAL
  Filled 2014-12-10: qty 1
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
## 2014-12-10 NOTE — ED Notes (Signed)
[facility] Expected date:  Expected time:  Means of arrival:  Comments: Tr5

## 2014-12-10 NOTE — BH Assessment (Signed)
[name] Assessment Progress Note  Per Hulan FessIjeoma Nwaeze, NP, this pt requires psychiatric hospitalization at this time.  Pt has been assigned pt to Rm 402-2.  Pt has signed Voluntary Admission and Consent for Treatment, as well as Consent to Release Information, and signed [facility].  Pt's nurse, Rudean HittDawnaly, has been notified, and agrees to send original paperwork along with pt via [facility], and to call report to [phone].  Doylene Canninghomas Yul Diana, MA Triage Specialist [phone]
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
## 2014-12-10 NOTE — [facility] Suicide Risk Assessment (Signed)
[name] INPATIENT:  Family/Significant Other Suicide Prevention Education  Suicide Prevention Education:  Patient Refusal for Family/Significant Other Suicide Prevention Education: The patient [name] has refused to provide written consent for family/significant other to be provided Family/Significant Other Suicide Prevention Education during admission and/or prior to discharge.  Physician notified.  Wynn BankerHodnett, Quinlee Sciarra Hairston 12/10/2014, 12:22 PM

## 2014-12-10 NOTE — ED Notes (Signed)
[facility]  Left the unit ambulatory with the [place] driver.  All belongings handed to the driver.
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
## 2014-12-10 NOTE — [facility] Group Notes (Signed)
[name] LCSW Group Therapy 12/10/2014  1:15 PM   Type of Therapy: Group Therapy  Participation Level: Did Not Attend. Patient invited to participate but declined.   [name], MSW, [employer]LCSWA Clinical Social Worker [facility] [phone]
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
## 2014-12-10 NOTE — Progress Notes (Signed)
Pt did not attend group this evening.  

## 2014-12-10 NOTE — Tx Team (Signed)
Initial Interdisciplinary Treatment Plan   PATIENT STRESSORS: Financial difficulties Marital or family conflict Medication change or noncompliance Occupational concerns Substance abuse   PATIENT STRENGTHS: Ability for insight Average or above average intelligence Capable of independent living Communication skills General fund of knowledge Motivation for treatment/growth   PROBLEM LIST: Problem List/Patient Goals Date to be addressed Date deferred Reason deferred Estimated date of resolution  "depression" 12/10/2014   D/c        "anxiety" 12/10/2014   D/c        "suicidal thoughts" 12/10/2014   D/c                           DISCHARGE CRITERIA:  Ability to meet basic life and health needs Improved stabilization in mood, thinking, and/or behavior Medical problems require only outpatient monitoring Motivation to continue treatment in a less acute level of care Need for constant or close observation no longer present Reduction of life-threatening or endangering symptoms to within safe limits Safe-care adequate arrangements made Verbal commitment to aftercare and medication compliance Withdrawal symptoms are absent or subacute and managed without 24-hour nursing intervention  PRELIMINARY DISCHARGE PLAN: Attend aftercare/continuing care group Attend PHP/IOP Attend 12-step recovery group Outpatient therapy Participate in family therapy Placement in alternative living arrangements  PATIENT/FAMIILY INVOLVEMENT: This treatment plan has been presented to and reviewed with the patient, [name].  The patient and family have been given the opportunity to ask questions and make suggestions.  Earline MayotteKnight, Draylon Mercadel Shephard 12/10/2014, 2:19 PM

## 2014-12-10 NOTE — BH Assessment (Signed)
[name] Assessment Progress Note   Clinician informed by [facility]) that [facility] to the services of Dr. Jama Flavorsobos.  Room assignment 402-2.  Patient can come after 08:00.  Clinician informed nurse Latricia who will let Rachelle know.

## 2014-12-11 DIAGNOSIS — F332 Major depressive disorder, recurrent severe without psychotic features: Secondary | ICD-10-CM | POA: Insufficient documentation

## 2014-12-11 LAB — GLUCOSE, CAPILLARY: Glucose-Capillary: 97 mg/dL (ref 65–99)

## 2014-12-11 MED ORDER — ATENOLOL 25 MG PO TABS: 25.0000 mg | ORAL_TABLET | Freq: Every day | ORAL | Status: DC

## 2014-12-11 MED ORDER — HYDROXYZINE HCL 25 MG PO TABS: 25.0000 mg | ORAL_TABLET | Freq: Four times a day (QID) | ORAL | Status: DC | PRN

## 2014-12-11 MED ORDER — NICOTINE 21 MG/24HR TD PT24: 21.0000 mg | MEDICATED_PATCH | Freq: Every day | TRANSDERMAL | Status: DC

## 2014-12-11 MED ORDER — GLIMEPIRIDE 2 MG PO TABS: 2.0000 mg | ORAL_TABLET | Freq: Every day | ORAL | Status: DC

## 2014-12-11 MED ORDER — LURASIDONE HCL 20 MG PO TABS: 20.0000 mg | ORAL_TABLET | Freq: Every day | ORAL | Status: DC

## 2014-12-11 MED ORDER — TRAMADOL HCL 50 MG PO TABS: 100.0000 mg | ORAL_TABLET | Freq: Four times a day (QID) | ORAL | Status: DC | PRN
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
## 2014-12-11 NOTE — BHH Group Notes (Signed)
The focus of this group is to educate the patient on the purpose and policies of crisis stabilization and provide a format to answer questions about their admission.  The group details unit policies and expectations of patients while admitted.  Patient did not attend 0900 nurse education orientation group this morning.  Patient stayed in bed.   
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
## 2014-12-11 NOTE — BHH Counselor (Signed)
Writer provided suicide prevention education directly to patient; conversation included risk factors, warning signs and resources to contact for help. Mobile crisis services explained and  explanations given as to resources which will be included on patient's discharge paperwork. Carney Bernatherine C Javontay Vandam, LCSW

## 2014-12-11 NOTE — Progress Notes (Signed)
  [facility] :  Will you be returning to the same living situation after discharge:  No; patient will be going to mother's home.  At discharge, do you have transportation home?: No. Patient reports [address] Patient agreeable to using bus pass that SW will provide.  Do you have the ability to pay for your medications: Yes,  Medicaid  Release of information consent forms completed and in the chart;  Patient's signature needed at discharge.  Patient to Follow up at: Follow-up Information    [facility]   Why:  [facility] on Friday, December 17, 2014 or any weekday between 8AM - 12Noon or 1-3PM for medication management counseling   Contact information:   [address] [place], [place]   [address]  [phone]       Have you used any form of tobacco in the last 30 days? (Cigarettes, Smokeless Tobacco, Cigars, and/or Pipes): Yes  Has patient been referred to the Quitline?: Patient refused referral  Clide DalesHarrill, Catherine Campbell 12/11/2014, 2:48 PM
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
## 2014-12-19 ENCOUNTER — Encounter (HOSPITAL_COMMUNITY): Payer: Self-pay | Admitting: *Deleted

## 2014-12-19 ENCOUNTER — Emergency Department (HOSPITAL_COMMUNITY)
Admission: EM | Admit: 2014-12-19 | Discharge: 2014-12-20 | Disposition: A | Discharge status: Home / Self Care | Payer: Medicaid Other | Attending: Emergency Medicine | Admitting: Emergency Medicine

## 2014-12-19 DIAGNOSIS — E876 Hypokalemia: Secondary | ICD-10-CM | POA: Diagnosis not present

## 2014-12-19 DIAGNOSIS — E119 Type 2 diabetes mellitus without complications: Secondary | ICD-10-CM | POA: Diagnosis not present

## 2014-12-19 DIAGNOSIS — Z87440 Personal history of urinary (tract) infections: Secondary | ICD-10-CM | POA: Insufficient documentation

## 2014-12-19 DIAGNOSIS — Z8619 Personal history of other infectious and parasitic diseases: Secondary | ICD-10-CM | POA: Insufficient documentation

## 2014-12-19 DIAGNOSIS — Z720 Tobacco use: Secondary | ICD-10-CM | POA: Diagnosis not present

## 2014-12-19 DIAGNOSIS — F410 Panic disorder [episodic paroxysmal anxiety] without agoraphobia: Secondary | ICD-10-CM | POA: Diagnosis present

## 2014-12-19 DIAGNOSIS — G43909 Migraine, unspecified, not intractable, without status migrainosus: Secondary | ICD-10-CM | POA: Insufficient documentation

## 2014-12-19 DIAGNOSIS — Z87442 Personal history of urinary calculi: Secondary | ICD-10-CM | POA: Insufficient documentation

## 2014-12-19 DIAGNOSIS — F319 Bipolar disorder, unspecified: Secondary | ICD-10-CM | POA: Insufficient documentation

## 2014-12-19 DIAGNOSIS — Y939 Activity, unspecified: Secondary | ICD-10-CM | POA: Diagnosis not present

## 2014-12-19 DIAGNOSIS — Z043 Encounter for examination and observation following other accident: Secondary | ICD-10-CM | POA: Insufficient documentation

## 2014-12-19 DIAGNOSIS — Y999 Unspecified external cause status: Secondary | ICD-10-CM | POA: Insufficient documentation

## 2014-12-19 DIAGNOSIS — Z79899 Other long term (current) drug therapy: Secondary | ICD-10-CM | POA: Insufficient documentation

## 2014-12-19 DIAGNOSIS — Y929 Unspecified place or not applicable: Secondary | ICD-10-CM | POA: Insufficient documentation

## 2014-12-19 DIAGNOSIS — E669 Obesity, unspecified: Secondary | ICD-10-CM | POA: Diagnosis not present
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
## 2014-12-20 LAB — BASIC METABOLIC PANEL
Anion gap: 8 (ref 5–15)
BUN: 6 mg/dL (ref 6–20)
CO2: 26 mmol/L (ref 22–32)
Calcium: 8.5 mg/dL — ABNORMAL LOW (ref 8.9–10.3)
Chloride: 104 mmol/L (ref 101–111)
Creatinine, Ser: 0.95 mg/dL (ref 0.44–1.00)
GFR calc Af Amer: >60 mL/min (ref >60)
GFR calc non Af Amer: >60 mL/min (ref >60)
Glucose, Bld: 100 mg/dL — ABNORMAL HIGH (ref 65–99)
Potassium: 2.9 mmol/L — ABNORMAL LOW (ref 3.5–5.1)
SODIUM: 138 mmol/L (ref 135–145)

## 2014-12-20 LAB — CBC WITH DIFFERENTIAL/PLATELET
BASOS PCT: 0 % (ref 0–1)
Basophils Absolute: 0 10*3/uL (ref 0.0–0.1)
Eosinophils Absolute: 0.3 10*3/uL (ref 0.0–0.7)
Eosinophils Relative: 2 % (ref 0–5)
HEMATOCRIT: 38.4 % (ref 36.0–46.0)
Hemoglobin: 12.2 g/dL (ref 12.0–15.0)
LYMPHS ABS: 3.7 10*3/uL (ref 0.7–4.0)
Lymphocytes Relative: 28 % (ref 12–46)
MCH: 25.1 pg — ABNORMAL LOW (ref 26.0–34.0)
MCHC: 31.8 g/dL (ref 30.0–36.0)
MCV: 79 fL (ref 78.0–100.0)
MONO ABS: 0.8 10*3/uL (ref 0.1–1.0)
MONOS PCT: 6 % (ref 3–12)
Neutro Abs: 8.5 10*3/uL — ABNORMAL HIGH (ref 1.7–7.7)
Neutrophils Relative %: 64 % (ref 43–77)
PLATELETS: 264 10*3/uL (ref 150–400)
RBC: 4.86 MIL/uL (ref 3.87–5.11)
RDW: 17.2 % — AB (ref 11.5–15.5)
WBC: 13.2 10*3/uL — ABNORMAL HIGH (ref 4.0–10.5)

## 2014-12-20 MED ORDER — LORAZEPAM 2 MG/ML IJ SOLN
1.0000 mg | Freq: Once | INTRAMUSCULAR | Status: AC
  Administered 2014-12-20: 1 mg via INTRAVENOUS
  Filled 2014-12-20: qty 1

## 2014-12-20 MED ORDER — POTASSIUM CHLORIDE CRYS ER 20 MEQ PO TBCR
60.0000 meq | EXTENDED_RELEASE_TABLET | Freq: Once | ORAL | Status: AC
  Administered 2014-12-20: 60 meq via ORAL
  Filled 2014-12-20: qty 3

## 2014-12-20 NOTE — Discharge Instructions (Signed)

## 2014-12-20 NOTE — ED Notes (Signed)
Pt sleeping and resting in bed.
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
## 2014-12-21 ENCOUNTER — Encounter (HOSPITAL_COMMUNITY): Payer: Self-pay | Admitting: *Deleted

## 2014-12-21 ENCOUNTER — Emergency Department (HOSPITAL_COMMUNITY)
Admission: EM | Admit: 2014-12-21 | Discharge: 2014-12-23 | Payer: Medicaid Other | Attending: Emergency Medicine | Admitting: Emergency Medicine

## 2014-12-21 ENCOUNTER — Encounter (HOSPITAL_COMMUNITY): Payer: Self-pay | Admitting: Emergency Medicine

## 2014-12-21 ENCOUNTER — Emergency Department (HOSPITAL_COMMUNITY): Payer: Medicaid Other

## 2014-12-21 ENCOUNTER — Emergency Department (HOSPITAL_COMMUNITY)
Admission: EM | Admit: 2014-12-21 | Discharge: 2014-12-21 | Discharge status: Against Medical Advice | Payer: Medicaid Other | Attending: Emergency Medicine | Admitting: Emergency Medicine

## 2014-12-21 DIAGNOSIS — T24012A Burn of unspecified degree of left thigh, initial encounter: Secondary | ICD-10-CM | POA: Insufficient documentation

## 2014-12-21 DIAGNOSIS — Y939 Activity, unspecified: Secondary | ICD-10-CM | POA: Diagnosis not present

## 2014-12-21 DIAGNOSIS — F332 Major depressive disorder, recurrent severe without psychotic features: Secondary | ICD-10-CM | POA: Diagnosis present

## 2014-12-21 DIAGNOSIS — M79604 Pain in right leg: Secondary | ICD-10-CM | POA: Insufficient documentation

## 2014-12-21 DIAGNOSIS — R45851 Suicidal ideations: Secondary | ICD-10-CM

## 2014-12-21 DIAGNOSIS — Z720 Tobacco use: Secondary | ICD-10-CM | POA: Insufficient documentation

## 2014-12-21 DIAGNOSIS — T23001A Burn of unspecified degree of right hand, unspecified site, initial encounter: Secondary | ICD-10-CM | POA: Insufficient documentation

## 2014-12-21 DIAGNOSIS — E119 Type 2 diabetes mellitus without complications: Secondary | ICD-10-CM | POA: Diagnosis not present

## 2014-12-21 DIAGNOSIS — Y929 Unspecified place or not applicable: Secondary | ICD-10-CM | POA: Diagnosis not present

## 2014-12-21 DIAGNOSIS — B370 Candidal stomatitis: Secondary | ICD-10-CM | POA: Diagnosis not present

## 2014-12-21 DIAGNOSIS — Y999 Unspecified external cause status: Secondary | ICD-10-CM | POA: Insufficient documentation

## 2014-12-21 DIAGNOSIS — M79606 Pain in leg, unspecified: Secondary | ICD-10-CM

## 2014-12-21 DIAGNOSIS — M79672 Pain in left foot: Secondary | ICD-10-CM

## 2014-12-21 DIAGNOSIS — T7411XA Adult physical abuse, confirmed, initial encounter: Secondary | ICD-10-CM | POA: Diagnosis not present

## 2014-12-21 DIAGNOSIS — Z87440 Personal history of urinary (tract) infections: Secondary | ICD-10-CM | POA: Insufficient documentation

## 2014-12-21 DIAGNOSIS — T23022A Burn of unspecified degree of single left finger (nail) except thumb, initial encounter: Secondary | ICD-10-CM | POA: Diagnosis not present

## 2014-12-21 DIAGNOSIS — L03011 Cellulitis of right finger: Secondary | ICD-10-CM | POA: Diagnosis not present

## 2014-12-21 DIAGNOSIS — T7491XA Unspecified adult maltreatment, confirmed, initial encounter: Secondary | ICD-10-CM

## 2014-12-21 DIAGNOSIS — T300 Burn of unspecified body region, unspecified degree: Secondary | ICD-10-CM

## 2014-12-21 DIAGNOSIS — R51 Headache: Secondary | ICD-10-CM | POA: Diagnosis not present

## 2014-12-21 DIAGNOSIS — F1310 Sedative, hypnotic or anxiolytic abuse, uncomplicated: Secondary | ICD-10-CM | POA: Diagnosis not present

## 2014-12-21 DIAGNOSIS — Z8619 Personal history of other infectious and parasitic diseases: Secondary | ICD-10-CM | POA: Diagnosis not present

## 2014-12-21 DIAGNOSIS — E669 Obesity, unspecified: Secondary | ICD-10-CM | POA: Diagnosis not present

## 2014-12-21 DIAGNOSIS — M25561 Pain in right knee: Secondary | ICD-10-CM

## 2014-12-21 DIAGNOSIS — F419 Anxiety disorder, unspecified: Secondary | ICD-10-CM | POA: Diagnosis present

## 2014-12-21 DIAGNOSIS — Z79899 Other long term (current) drug therapy: Secondary | ICD-10-CM | POA: Insufficient documentation

## 2014-12-21 DIAGNOSIS — F329 Major depressive disorder, single episode, unspecified: Secondary | ICD-10-CM | POA: Insufficient documentation

## 2014-12-21 DIAGNOSIS — R519 Headache, unspecified: Secondary | ICD-10-CM

## 2014-12-21 DIAGNOSIS — F1110 Opioid abuse, uncomplicated: Secondary | ICD-10-CM | POA: Diagnosis not present

## 2014-12-21 DIAGNOSIS — J029 Acute pharyngitis, unspecified: Secondary | ICD-10-CM | POA: Insufficient documentation

## 2014-12-21 DIAGNOSIS — Z87442 Personal history of urinary calculi: Secondary | ICD-10-CM | POA: Insufficient documentation

## 2014-12-21 DIAGNOSIS — T23011A Burn of unspecified degree of right thumb (nail), initial encounter: Secondary | ICD-10-CM | POA: Insufficient documentation

## 2014-12-21 DIAGNOSIS — F32A Depression, unspecified: Secondary | ICD-10-CM

## 2014-12-21 LAB — POC URINE PREG, ED: Preg Test, Ur: NEGATIVE

## 2014-12-21 LAB — CBG MONITORING, ED: Glucose-Capillary: 110 mg/dL — ABNORMAL HIGH (ref 65–99)

## 2014-12-21 MED ORDER — NYSTATIN NICU ORAL SYRINGE 100,000 UNITS/ML
6.0000 mL | Freq: Four times a day (QID) | OROMUCOSAL | Status: DC
  Administered 2014-12-21: 6 mL via ORAL
  Filled 2014-12-21 (×8): qty 6

## 2014-12-21 MED ORDER — PROMETHAZINE HCL 25 MG/ML IJ SOLN
25.0000 mg | Freq: Once | INTRAMUSCULAR | Status: DC
  Filled 2014-12-21: qty 1

## 2014-12-21 MED ORDER — DIPHENHYDRAMINE HCL 25 MG PO CAPS
25.0000 mg | ORAL_CAPSULE | Freq: Once | ORAL | Status: AC
  Administered 2014-12-23: 25 mg via ORAL
  Filled 2014-12-21 (×2): qty 1

## 2014-12-21 MED ORDER — MAGIC MOUTHWASH
10.0000 mL | Freq: Four times a day (QID) | ORAL | Status: DC | PRN
  Administered 2014-12-21 – 2014-12-22 (×3): 10 mL via ORAL
  Administered 2014-12-22: 13:00:00 via ORAL
  Administered 2014-12-22: 10 mL via ORAL
  Filled 2014-12-21 (×7): qty 10

## 2014-12-21 MED ORDER — KETOROLAC TROMETHAMINE 60 MG/2ML IM SOLN
60.0000 mg | Freq: Once | INTRAMUSCULAR | Status: AC
  Administered 2014-12-21: 60 mg via INTRAMUSCULAR
  Filled 2014-12-21: qty 2

## 2014-12-21 MED ORDER — PROCHLORPERAZINE MALEATE 10 MG PO TABS
10.0000 mg | ORAL_TABLET | Freq: Once | ORAL | Status: AC
  Administered 2014-12-21: 10 mg via ORAL
  Filled 2014-12-21: qty 1

## 2014-12-21 MED ORDER — HYDROXYZINE HCL 25 MG PO TABS
50.0000 mg | ORAL_TABLET | Freq: Four times a day (QID) | ORAL | Status: DC | PRN
  Administered 2014-12-21 – 2014-12-23 (×7): 50 mg via ORAL
  Filled 2014-12-21 (×7): qty 2

## 2014-12-21 MED ORDER — LIDOCAINE VISCOUS 2 % MT SOLN
15.0000 mL | Freq: Once | OROMUCOSAL | Status: AC
  Administered 2014-12-21: 15 mL via OROMUCOSAL
  Filled 2014-12-21: qty 15

## 2014-12-21 NOTE — Discharge Instructions (Signed)
PATIENT DECIDES TO LEAVE PRIOR TO TREATMENT OR DISCHARGE.

## 2014-12-21 NOTE — ED Notes (Signed)
meds ordered from pharmacy.

## 2014-12-21 NOTE — ED Notes (Signed)
Pt ambulatory with steady gait to void in BR.  

## 2014-12-21 NOTE — ED Notes (Signed)
Pt complaining of headache, provider at bedside. [facility]  Awaiting security to want pt/belongings.

## 2014-12-21 NOTE — ED Notes (Signed)
Social work at bedside.  

## 2014-12-21 NOTE — ED Notes (Signed)
Social work paged to assist pt with potential resources.

## 2014-12-21 NOTE — Progress Notes (Signed)
ED CM saw pt arrive Reviewed RN notes  [phone] Spoke with ED SW about pt  SW spoke with TTS, [place] about pt

## 2014-12-21 NOTE — ED Notes (Signed)
TTS at bedside. 
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
## 2014-12-21 NOTE — ED Notes (Signed)
Pt reports migraine h/a and nausea tonight.  Pt also reports R 5th finger infection which [facility].  States its been like this for a month now and has been on several abx without relief.
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
## 2014-12-21 NOTE — ED Notes (Signed)
Pt more calm at this time, resting on stretcher with eyes closed, self repositioning for comfort.  NAD.

## 2014-12-22 DIAGNOSIS — F332 Major depressive disorder, recurrent severe without psychotic features: Secondary | ICD-10-CM

## 2014-12-22 LAB — RAPID URINE DRUG SCREEN, HOSP PERFORMED
Amphetamines: NOT DETECTED
BENZODIAZEPINES: POSITIVE — AB
Barbiturates: NOT DETECTED
Cocaine: NOT DETECTED
Opiates: POSITIVE — AB
Tetrahydrocannabinol: NOT DETECTED

## 2014-12-22 LAB — CBG MONITORING, ED: Glucose-Capillary: 108 mg/dL — ABNORMAL HIGH (ref 65–99)

## 2014-12-22 LAB — HIV ANTIBODY (ROUTINE TESTING W REFLEX): HIV SCREEN 4TH GENERATION: NONREACTIVE

## 2014-12-22 MED ORDER — NAPROXEN 500 MG PO TABS
500.0000 mg | ORAL_TABLET | Freq: Two times a day (BID) | ORAL | Status: DC
  Administered 2014-12-22: 500 mg via ORAL
  Administered 2014-12-22: 250 mg via ORAL
  Administered 2014-12-23: 500 mg via ORAL
  Filled 2014-12-22 (×3): qty 1

## 2014-12-22 MED ORDER — CLONIDINE HCL 0.1 MG PO TABS
0.1000 mg | ORAL_TABLET | Freq: Three times a day (TID) | ORAL | Status: DC | PRN
  Administered 2014-12-22 (×2): 0.1 mg via ORAL
  Filled 2014-12-22 (×2): qty 1

## 2014-12-22 MED ORDER — NYSTATIN 100000 UNIT/ML MT SUSP
6.0000 mL | Freq: Four times a day (QID) | OROMUCOSAL | Status: DC
  Administered 2014-12-22 (×2): 600000 [IU] via ORAL
  Administered 2014-12-22 (×2): 500000 [IU] via ORAL
  Administered 2014-12-23: 600000 [IU] via ORAL
  Filled 2014-12-22 (×9): qty 10

## 2014-12-22 MED ORDER — ONDANSETRON 4 MG PO TBDP
4.0000 mg | ORAL_TABLET | ORAL | Status: DC | PRN
  Administered 2014-12-22 (×2): 4 mg via ORAL
  Filled 2014-12-22 (×2): qty 1

## 2014-12-22 MED ORDER — TRAZODONE HCL 50 MG PO TABS
50.0000 mg | ORAL_TABLET | Freq: Every evening | ORAL | Status: DC | PRN
  Administered 2014-12-22 – 2014-12-23 (×2): 50 mg via ORAL
  Filled 2014-12-22 (×2): qty 1

## 2014-12-22 MED ORDER — BENZOCAINE 10 % MT GEL
Freq: Three times a day (TID) | OROMUCOSAL | Status: DC | PRN
  Administered 2014-12-22 – 2014-12-23 (×2): via OROMUCOSAL
  Filled 2014-12-22: qty 9.4

## 2014-12-22 MED ORDER — IBUPROFEN 800 MG PO TABS
800.0000 mg | ORAL_TABLET | Freq: Once | ORAL | Status: AC
  Administered 2014-12-22: 800 mg via ORAL
  Filled 2014-12-22: qty 1

## 2014-12-22 MED ORDER — LURASIDONE HCL 40 MG PO TABS
20.0000 mg | ORAL_TABLET | Freq: Every day | ORAL | Status: DC
  Administered 2014-12-22 – 2014-12-23 (×2): 20 mg via ORAL
  Filled 2014-12-22 (×2): qty 1

## 2014-12-22 MED ORDER — POTASSIUM CHLORIDE CRYS ER 20 MEQ PO TBCR
20.0000 meq | EXTENDED_RELEASE_TABLET | Freq: Two times a day (BID) | ORAL | Status: DC
  Administered 2014-12-22 – 2014-12-23 (×2): 20 meq via ORAL
  Filled 2014-12-22 (×2): qty 1

## 2014-12-22 NOTE — BH Assessment (Signed)
[name] Assessment Progress Note  The following facilities have been contacted to seek placement for this pt, with results as noted:  Beds available, information sent, decision pending:  [facility]  At capacity:  [facility]  Kemuel Buchmann, MA Triage Specialist [phone]     

## 2014-12-22 NOTE — ED Notes (Signed)
Psychologist, clinicalAnna counselor at bedside.

## 2014-12-22 NOTE — ED Notes (Signed)
Patient c/o n/v and vomiting along with  chills and anxiety r/t heroine withdrawal.  PRN medication requested and received. Demanding to leave.  IVC explained. Support offered.

## 2014-12-22 NOTE — [facility] Counselor (Signed)
Around 04:20 AM, pt began threatening to leave AMA because she isn't being administered narcotic pain meds. [name], RN informed counselor and she went to speak with pt.   Pt is reporting that she needs something stronger for her pain and for her withdrawal sx. She complains that the nurses are not helping her or listening to her, stating, "I'd just rather go home and do drugs". However, [name] had just administered Vistiril and Ibuprofen in response to pt's complaints of being in pain and unable to sleep. Counselor asked pt if she was still feeling like hurting herself, to which she replied, "Yeah, but it doesn't matter."  Dr Elesa MassedWard was informed and is IVCing pt.   Cyndie MullAnna Kellianne Ek, [facility] Triage Specialist

## 2014-12-22 NOTE — ED Provider Notes (Signed)
4:30 AM  Pt now wanting to go home because we are not providing her narcotic pain medication. Threatening to leave but still endorsing suicidal thoughts per Tobi BastosAnna with TTS. Will take out IVC paperwork.  Layla MawKristen N Pearlena Ow, DO 12/22/14 0430

## 2014-12-22 NOTE — ED Notes (Signed)
Patient anxious, irritable. Reports feeling clamy and shaky. Reports left lower tooth has chipped. States she wants to go back to the streets bc she feels she is not getting any help.  Encouragement offered. Given Nystatin, Mouthwash, Latuda, and a snack. Q 15 safety checks continue.

## 2014-12-22 NOTE — ED Notes (Signed)
Patient came to nurses station stating " I want more pain medication and if they not going to give me any fucking more medication they let me go because I can get better medication at home". Patient informed by Clinical research associatewriter that she would send counselor down to talk with patient about discharge option. Tobi BastosAnna, TTS counselor informed of patient request. Dr. Elesa MassedWard also informed of patient's statements and request.

## 2014-12-22 NOTE — ED Notes (Signed)
Patient gave permission for staff to give her car keys to her father.

## 2014-12-22 NOTE — ED Notes (Addendum)
Patient requested and received vistaril  for anxiety/withdrawal symptoms.

## 2014-12-22 NOTE — ED Notes (Signed)
Patient admits to SI without a plan and AH with voices telling her she's nothing". Patient denies HI and VH at this time. Plan of care discussed and patient voices no complaints or concerns at this time. Encouragement and support provided and safety maintain. Q 15 min safety check remain in place.
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
## 2014-12-22 NOTE — ED Notes (Signed)
Patient is irritable and difficult to redirect.  She is now requesting "detox" from cocaine and heroin.  PRN medicatins requested and received.

## 2014-12-22 NOTE — BH Assessment (Signed)
Seeking inpt placement. [facility]   Anjoli Diemer, [place] Triage Specialist 12/22/2014 5:04 AM

## 2014-12-22 NOTE — BHH Counselor (Signed)
[facility] that [facility] was [facility], Dr. Freda JacksonJay Synn accepting.  Albin FellingCarla requested to call Nurse report to [phone]. Patient can be transported anytime [facility] to [facility].

## 2014-12-22 NOTE — ED Notes (Signed)
Urine cup and toiletry items given.  Support offered for withdrawal symptoms.

## 2014-12-23 MED ORDER — METHOCARBAMOL 500 MG PO TABS
500.0000 mg | ORAL_TABLET | Freq: Four times a day (QID) | ORAL | Status: DC
  Administered 2014-12-23 (×2): 500 mg via ORAL
  Filled 2014-12-23 (×2): qty 1

## 2014-12-23 MED ORDER — NICOTINE 21 MG/24HR TD PT24
21.0000 mg | MEDICATED_PATCH | Freq: Once | TRANSDERMAL | Status: DC
Start: 2014-12-23 — End: 2014-12-23
  Administered 2014-12-23: 21 mg via TRANSDERMAL
  Filled 2014-12-23: qty 1

## 2014-12-23 NOTE — ED Notes (Signed)
[facility]  [facility]  All belongings given to the transporter.  Call to accepting facility to let them know she is on her way..Marland Kitchen

## 2014-12-23 NOTE — BH Assessment (Signed)
[name] Assessment Progress Note  [name] calls from [facility] at 08:41 to report that pt has been accepted to their facility by Dr. Lula OlszewskiJaysynn.  Please call report to [phone].  Thedore MinsMojeed Akintayo, MD concurs with this decision.  Pt's nurse, [name], has been notified.  Doylene Canninghomas Laura Dejonge, MA Triage Specialist [phone]

## 2014-12-23 NOTE — ED Notes (Signed)
Report called to Denny PeonAvery, RN at [facility].  [facility] here around noon.

## 2014-12-23 NOTE — Progress Notes (Signed)
Pt requested to speak with CSW. Pt expressed concerns about [facility]. Patient upset about being so far away and not being able to see her children. Pt stated that her children are staying with children's grandparents her mother Letitia Neriatricia Nonaka. CSW called and spoke with pt mother patricia Faust and confirmed 2 daughters are being kept in their home and are able to adequately care for patient children. Pt mother states she wont be abel to visit patient so far away but plans to call and update patient on daughters well being.   Olga CoasterKristen Keshona Kartes, LCSW  Clinical Social Work  [facility] [phone]

## 2014-12-23 NOTE — Progress Notes (Signed)
Yesterday psychiatric team was informed by tts  that [facility] Dr. Lula OlszewskiJaysynn however patient was not transferred. CSW [facility] this morning. CSW awaiting return call.   Olga CoasterKristen Marteze Vecchio, LCSW  Clinical Social Work  [facility] [phone]

## 2015-02-02 ENCOUNTER — Emergency Department (HOSPITAL_COMMUNITY)
Admission: EM | Admit: 2015-02-02 | Discharge: 2015-02-02 | Disposition: A | Discharge status: Home / Self Care | Payer: Medicaid Other | Attending: Emergency Medicine | Admitting: Emergency Medicine

## 2015-02-02 ENCOUNTER — Emergency Department (HOSPITAL_COMMUNITY): Payer: Medicaid Other

## 2015-02-02 ENCOUNTER — Encounter (HOSPITAL_COMMUNITY): Payer: Self-pay | Admitting: Emergency Medicine

## 2015-02-02 DIAGNOSIS — M5441 Lumbago with sciatica, right side: Secondary | ICD-10-CM | POA: Diagnosis not present

## 2015-02-02 DIAGNOSIS — Z8659 Personal history of other mental and behavioral disorders: Secondary | ICD-10-CM | POA: Insufficient documentation

## 2015-02-02 DIAGNOSIS — Z87442 Personal history of urinary calculi: Secondary | ICD-10-CM | POA: Insufficient documentation

## 2015-02-02 DIAGNOSIS — Z720 Tobacco use: Secondary | ICD-10-CM | POA: Insufficient documentation

## 2015-02-02 DIAGNOSIS — E119 Type 2 diabetes mellitus without complications: Secondary | ICD-10-CM | POA: Insufficient documentation

## 2015-02-02 DIAGNOSIS — Z8679 Personal history of other diseases of the circulatory system: Secondary | ICD-10-CM | POA: Insufficient documentation

## 2015-02-02 DIAGNOSIS — Z8619 Personal history of other infectious and parasitic diseases: Secondary | ICD-10-CM | POA: Diagnosis not present

## 2015-02-02 DIAGNOSIS — Z87440 Personal history of urinary (tract) infections: Secondary | ICD-10-CM | POA: Insufficient documentation

## 2015-02-02 DIAGNOSIS — M545 Low back pain: Secondary | ICD-10-CM | POA: Diagnosis present

## 2015-02-02 LAB — I-STAT BETA HCG BLOOD, ED (MC, WL, AP ONLY)

## 2015-02-02 MED ORDER — OXYCODONE-ACETAMINOPHEN 5-325 MG PO TABS
2.0000 | ORAL_TABLET | Freq: Once | ORAL | Status: AC
  Administered 2015-02-02: 2 via ORAL
  Filled 2015-02-02: qty 2

## 2015-02-02 MED ORDER — PREDNISONE 50 MG PO TABS: ORAL_TABLET | ORAL | Status: DC

## 2015-02-02 MED ORDER — HYDROMORPHONE HCL 1 MG/ML IJ SOLN
1.0000 mg | Freq: Once | INTRAMUSCULAR | Status: AC
  Administered 2015-02-02: 1 mg via INTRAMUSCULAR
  Filled 2015-02-02: qty 1

## 2015-02-02 MED ORDER — OXYCODONE-ACETAMINOPHEN 5-325 MG PO TABS: 1.0000 | ORAL_TABLET | Freq: Four times a day (QID) | ORAL | Status: DC | PRN

## 2015-02-02 NOTE — ED Notes (Signed)
Patient transported to X-ray 

## 2015-02-02 NOTE — ED Provider Notes (Addendum)
CSN: [id number]     Arrival date & time 02/02/15  9562 History   First MD Initiated Contact with Patient 02/02/15 0945     Chief Complaint  Patient presents with  . Back Pain  . Leg Pain     (Consider location/radiation/quality/duration/timing/severity/associated sxs/prior Treatment) HPI.... Patient complains of intermittent lower back pain [right greater than left] for 1 year with associated pain going down the right leg. She has been going to an urgent care center for treatment. No bowel or bladder incontinence. Severity of pain is moderate. Position palpation make pain worse.  Past Medical History  Diagnosis Date  . Depression   . Bipolar 1 disorder, depressed   . PTSD (post-traumatic stress disorder)   . Kidney stone   . Migraine   . Urinary tract infection   . Pregnancy induced hypertension   . Obesity   . UTI (lower urinary tract infection)   . Diabetes mellitus without complication   . Hep C w/o coma, chronic    Past Surgical History  Procedure Laterality Date  . Kidney stones    . Lipotripsy     Family History  Problem Relation Age of Onset  . Rheum arthritis Mother   . Asthma Mother   . Hypertension Mother   . Other Neg Hx   . CAD Other    Social History  Substance Use Topics  . Smoking status: Current Every Day Smoker -- 1.00 packs/day    Types: Cigarettes  . Smokeless tobacco: Never Used  . Alcohol Use: No     Comment: denied any alcohol use   OB History    Gravida Para Term Preterm AB TAB SAB Ectopic Multiple Living   3 2 2  1  1   2      Review of Systems  All other systems reviewed and are negative.     Allergies  Acetaminophen; Darvocet; Morphine and related; Sulfa antibiotics; Tomato; and Pioglitazone  Home Medications   Prior to Admission medications   Medication Sig Start Date End Date Taking? Authorizing Provider  acetaminophen (TYLENOL) 500 MG tablet Take 1,000 mg by mouth every 6 (six) hours as needed for mild pain, moderate pain  or headache.   Yes Historical Provider, MD  ibuprofen (ADVIL,MOTRIN) 200 MG tablet Take 400 mg by mouth every 6 (six) hours as needed for headache, mild pain or moderate pain.   Yes Historical Provider, MD  atenolol (TENORMIN) 25 MG tablet Take 1 tablet (25 mg total) by mouth daily. Patient not taking: Reported on 12/21/2014 12/11/14   Beau Fanny, FNP  glimepiride (AMARYL) 2 MG tablet Take 1 tablet (2 mg total) by mouth daily with breakfast. Patient not taking: Reported on 12/21/2014 12/11/14   Beau Fanny, FNP  lurasidone 20 MG TABS Take 1 tablet (20 mg total) by mouth daily with breakfast. Patient not taking: Reported on 12/21/2014 12/11/14   Beau Fanny, FNP  nicotine (NICODERM CQ - DOSED IN MG/24 HOURS) 21 mg/24hr patch Place 1 patch (21 mg total) onto the skin daily. Patient not taking: Reported on 12/20/2014 12/11/14   Beau Fanny, FNP  predniSONE (DELTASONE) 50 MG tablet 1 tablet for 6 days, one half tablet for 6 days 02/02/15   Donnetta Hutching, MD   BP 144/84 mmHg  Pulse 68  Temp(Src) 97.6 F (36.4 C) (Oral)  Resp 17  SpO2 95%  LMP 01/20/2015 Physical Exam  Constitutional: She is oriented to person, place, and time. She appears well-developed and well-nourished.  Morbidly obese  HENT:  Head: Normocephalic and atraumatic.  Eyes: Conjunctivae and EOM are normal. Pupils are equal, round, and reactive to light.  Neck: Normal range of motion. Neck supple.  Cardiovascular: Normal rate and regular rhythm.   Pulmonary/Chest: Effort normal and breath sounds normal.  Abdominal: Soft. Bowel sounds are normal.  Musculoskeletal: Normal range of motion.  Minimal tenderness right lower back. Pain with straight leg raise right greater than left  Neurological: She is alert and oriented to person, place, and time.  Skin: Skin is warm and dry.  Psychiatric: She has a normal mood and affect. Her behavior is normal.  Nursing note and vitals reviewed.   ED Course  Procedures (including critical  care time) Labs Review Labs Reviewed  I-STAT BETA HCG BLOOD, ED ([facility], [facility], [facility] ONLY)    Imaging Review Dg Lumbar Spine Complete  02/02/2015   CLINICAL DATA:  Mid lower back pain radiating down the right leg. Right foot tingling. Symptoms started 4 days ago.  EXAM: LUMBAR SPINE - COMPLETE 4+ VIEW  COMPARISON:  CT 07/29/2014 and 02/07/2013  FINDINGS: Again noted is minimal dextroscoliosis of the thoracolumbar spine with a rotary component. There are 6 non rib-bearing vertebral bodies. Negative for a pars defect. The vertebral body heights and disc spaces are maintained. Minimal degenerate endplate changes in the lower thoracic spine.  IMPRESSION: No acute bone abnormality.  Minimal curvature of the spine.   Electronically Signed   By: Richarda Overlie M.D.   On: 02/02/2015 11:18   I have personally reviewed and evaluated these images and lab results as part of my medical decision-making.   EKG Interpretation None      MDM   Final diagnoses:  Right-sided low back pain with right-sided sciatica    Patient presents with low back pain with radicular component down right leg. Patient given pain medication in ED. Discharge medications prednisone and Percocet. Donnetta Hutching, MD 02/02/15 1253  Donnetta Hutching, MD 02/02/15 [phone]
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
## 2015-02-02 NOTE — Discharge Instructions (Signed)
Try to lose weight;  This will help your back.  Prescription for prednisone and pain meds

## 2015-02-02 NOTE — ED Notes (Signed)
Verified allergies with patient and she states she doesn't have a problem with allergies when taking Percocet.

## 2015-02-04 ENCOUNTER — Emergency Department (HOSPITAL_COMMUNITY)
Admission: EM | Admit: 2015-02-04 | Discharge: 2015-02-05 | Disposition: A | Discharge status: Home / Self Care | Payer: Medicaid Other | Attending: Emergency Medicine | Admitting: Emergency Medicine

## 2015-02-04 ENCOUNTER — Encounter (HOSPITAL_COMMUNITY): Payer: Self-pay | Admitting: Emergency Medicine

## 2015-02-04 DIAGNOSIS — Z79899 Other long term (current) drug therapy: Secondary | ICD-10-CM | POA: Insufficient documentation

## 2015-02-04 DIAGNOSIS — Z87442 Personal history of urinary calculi: Secondary | ICD-10-CM | POA: Insufficient documentation

## 2015-02-04 DIAGNOSIS — F319 Bipolar disorder, unspecified: Secondary | ICD-10-CM | POA: Diagnosis not present

## 2015-02-04 DIAGNOSIS — Z720 Tobacco use: Secondary | ICD-10-CM | POA: Diagnosis not present

## 2015-02-04 DIAGNOSIS — Z8619 Personal history of other infectious and parasitic diseases: Secondary | ICD-10-CM | POA: Insufficient documentation

## 2015-02-04 DIAGNOSIS — R1110 Vomiting, unspecified: Secondary | ICD-10-CM | POA: Diagnosis not present

## 2015-02-04 DIAGNOSIS — Z792 Long term (current) use of antibiotics: Secondary | ICD-10-CM | POA: Diagnosis not present

## 2015-02-04 DIAGNOSIS — Z87440 Personal history of urinary (tract) infections: Secondary | ICD-10-CM | POA: Insufficient documentation

## 2015-02-04 DIAGNOSIS — M545 Low back pain: Secondary | ICD-10-CM | POA: Diagnosis present

## 2015-02-04 DIAGNOSIS — G43909 Migraine, unspecified, not intractable, without status migrainosus: Secondary | ICD-10-CM | POA: Diagnosis not present

## 2015-02-04 DIAGNOSIS — E119 Type 2 diabetes mellitus without complications: Secondary | ICD-10-CM | POA: Diagnosis not present

## 2015-02-04 DIAGNOSIS — G8929 Other chronic pain: Secondary | ICD-10-CM | POA: Insufficient documentation

## 2015-02-04 LAB — COMPREHENSIVE METABOLIC PANEL
ALT: 62 U/L — AB (ref 14–54)
AST: 32 U/L (ref 15–41)
Albumin: 3.3 g/dL — ABNORMAL LOW (ref 3.5–5.0)
Alkaline Phosphatase: 63 U/L (ref 38–126)
Anion gap: 5 (ref 5–15)
BILIRUBIN TOTAL: 0.2 mg/dL — AB (ref 0.3–1.2)
BUN: 9 mg/dL (ref 6–20)
CHLORIDE: 105 mmol/L (ref 101–111)
CO2: 26 mmol/L (ref 22–32)
CREATININE: 0.72 mg/dL (ref 0.44–1.00)
Calcium: 8.7 mg/dL — ABNORMAL LOW (ref 8.9–10.3)
Glucose, Bld: 93 mg/dL (ref 65–99)
POTASSIUM: 3.6 mmol/L (ref 3.5–5.1)
Sodium: 136 mmol/L (ref 135–145)
TOTAL PROTEIN: 7 g/dL (ref 6.5–8.1)

## 2015-02-04 LAB — CBC
HCT: 39.6 % (ref 36.0–46.0)
Hemoglobin: 12.8 g/dL (ref 12.0–15.0)
MCH: 26.6 pg (ref 26.0–34.0)
MCHC: 32.3 g/dL (ref 30.0–36.0)
MCV: 82.3 fL (ref 78.0–100.0)
Platelets: 262 10*3/uL (ref 150–400)
RBC: 4.81 MIL/uL (ref 3.87–5.11)
RDW: 15.6 % — ABNORMAL HIGH (ref 11.5–15.5)
WBC: 9.1 10*3/uL (ref 4.0–10.5)

## 2015-02-04 LAB — URINALYSIS, ROUTINE W REFLEX MICROSCOPIC
Bilirubin Urine: NEGATIVE
Glucose, UA: NEGATIVE mg/dL
Hgb urine dipstick: NEGATIVE
KETONES UR: NEGATIVE mg/dL
Nitrite: NEGATIVE
PH: 6 (ref 5.0–8.0)
PROTEIN: NEGATIVE mg/dL
Specific Gravity, Urine: 1.015 (ref 1.005–1.030)
Urobilinogen, UA: 1 mg/dL (ref 0.0–1.0)

## 2015-02-04 LAB — URINE MICROSCOPIC-ADD ON

## 2015-02-04 LAB — LIPASE, BLOOD: Lipase: 14 U/L — ABNORMAL LOW (ref 22–51)

## 2015-02-04 NOTE — ED Notes (Addendum)
Pt from home c/o low back pain and vomiting. Pt seen PCP on Thursday told she has UTI and was given Cipro and Vicodin (10-325). Last dose was 1930

## 2015-02-05 MED ORDER — PROMETHAZINE HCL 25 MG PO TABS: 25.0000 mg | ORAL_TABLET | Freq: Four times a day (QID) | ORAL | Status: DC | PRN

## 2015-02-05 MED ORDER — ONDANSETRON 8 MG PO TBDP
8.0000 mg | ORAL_TABLET | Freq: Once | ORAL | Status: AC
  Administered 2015-02-05: 8 mg via ORAL
  Filled 2015-02-05: qty 1

## 2015-02-05 MED ORDER — KETOROLAC TROMETHAMINE 60 MG/2ML IM SOLN
60.0000 mg | Freq: Once | INTRAMUSCULAR | Status: AC
  Administered 2015-02-05: 60 mg via INTRAMUSCULAR
  Filled 2015-02-05: qty 2

## 2015-02-05 NOTE — Discharge Instructions (Signed)
Back Pain, Adult Low back pain is very common. About 1 in 5 people have back pain.The cause of low back pain is rarely dangerous. The pain often gets better over time.About half of people with a sudden onset of back pain feel better in just 2 weeks. About 8 in 10 people feel better by 6 weeks.  CAUSES Some common causes of back pain include:  Strain of the muscles or ligaments supporting the spine.  Wear and tear (degeneration) of the spinal discs.  Arthritis.  Direct injury to the back. DIAGNOSIS Most of the time, the direct cause of low back pain is not known.However, back pain can be treated effectively even when the exact cause of the pain is unknown.Answering your caregiver's questions about your overall health and symptoms is one of the most accurate ways to make sure the cause of your pain is not dangerous. If your caregiver needs more information, he or she may order lab work or imaging tests (X-rays or MRIs).However, even if imaging tests show changes in your back, this usually does not require surgery. HOME CARE INSTRUCTIONS For many people, back pain returns.Since low back pain is rarely dangerous, it is often a condition that people can learn to manageon their own.   Remain active. It is stressful on the back to sit or stand in one place. Do not sit, drive, or stand in one place for more than 30 minutes at a time. Take short walks on level surfaces as soon as pain allows.Try to increase the length of time you walk each day.  Do not stay in bed.Resting more than 1 or 2 days can delay your recovery.  Do not avoid exercise or work.Your body is made to move.It is not dangerous to be active, even though your back may hurt.Your back will likely heal faster if you return to being active before your pain is gone.  Pay attention to your body when you bend and lift. Many people have less discomfortwhen lifting if they bend their knees, keep the load close to their bodies,and  avoid twisting. Often, the most comfortable positions are those that put less stress on your recovering back.  Find a comfortable position to sleep. Use a firm mattress and lie on your side with your knees slightly bent. If you lie on your back, put a pillow under your knees.  Only take over-the-counter or prescription medicines as directed by your caregiver. Over-the-counter medicines to reduce pain and inflammation are often the most helpful.Your caregiver may prescribe muscle relaxant drugs.These medicines help dull your pain so you can more quickly return to your normal activities and healthy exercise.  Put ice on the injured area.  Put ice in a plastic bag.  Place a towel between your skin and the bag.  Leave the ice on for 15-20 minutes, 03-04 times a day for the first 2 to 3 days. After that, ice and heat may be alternated to reduce pain and spasms.  Ask your caregiver about trying back exercises and gentle massage. This may be of some benefit.  Avoid feeling anxious or stressed.Stress increases muscle tension and can worsen back pain.It is important to recognize when you are anxious or stressed and learn ways to manage it.Exercise is a great option. SEEK MEDICAL CARE IF:  You have pain that is not relieved with rest or medicine.  You have pain that does not improve in 1 week.  You have new symptoms.  You are generally not feeling well. SEEK   IMMEDIATE MEDICAL CARE IF:   You have pain that radiates from your back into your legs.  You develop new bowel or bladder control problems.  You have unusual weakness or numbness in your arms or legs.  You develop nausea or vomiting.  You develop abdominal pain.  You feel faint. Document Released: 05/28/2005 Document Revised: 11/27/2011 Document Reviewed: 09/29/2013 Exit[employer] Patient Information 2015 [employer]. This information is not intended to replace advice given to you by your health care provider. Make sure you  discuss any questions you have with your health care provider.  

## 2015-02-05 NOTE — ED Provider Notes (Signed)
CSN: [id number]     Arrival date & time 02/04/15  2209 History   First MD Initiated Contact with Patient 02/05/15 0252     Chief Complaint  Patient presents with  . Abdominal Pain  . Emesis     (Consider location/radiation/quality/duration/timing/severity/associated sxs/prior Treatment) HPI Comments: 29 year old female with a history of depression, bipolar 1 disorder, PTSD, urinary tract infection, diabetes mellitus, and obesity presents to the emergency department for complaints of persistent back pain. Patient states that she has had back pain on the last few days. It feels similar to when she was evaluated in the emergency department 3 days ago. She denies any radiation of the pain. She states that her pain feels consistent with her chronic low back pain. Patient started on Vicodin by her eye [facility] care doctor. She has been taking this without relief. She states that the pain makes her nauseous at times. She has vomited 4 times today. She denies any bloody emesis. Patient noted to have a soft drink and food in her purse. She was told by her primary care doctor yesterday that she had a urinary tract infection. Patient was started on ciprofloxacin at this time. She denies urinary symptoms such as burning dysuria or hematuria. No vaginal complaints. Patient further denies any bowel/bladder incontinence.  Patient is a 29 y.o. female presenting with vomiting. The history is provided by the patient. No language interpreter was used.  Emesis Associated symptoms: no abdominal pain and no diarrhea     Past Medical History  Diagnosis Date  . Depression   . Bipolar 1 disorder, depressed   . PTSD (post-traumatic stress disorder)   . Kidney stone   . Migraine   . Urinary tract infection   . Pregnancy induced hypertension   . Obesity   . UTI (lower urinary tract infection)   . Diabetes mellitus without complication   . Hep C w/o coma, chronic    Past Surgical History  Procedure Laterality Date   . Kidney stones    . Lipotripsy     Family History  Problem Relation Age of Onset  . Rheum arthritis Mother   . Asthma Mother   . Hypertension Mother   . Other Neg Hx   . CAD Other    Social History  Substance Use Topics  . Smoking status: Current Every Day Smoker -- 1.00 packs/day    Types: Cigarettes  . Smokeless tobacco: Never Used  . Alcohol Use: No     Comment: denied any alcohol use   OB History    Gravida Para Term Preterm AB TAB SAB Ectopic Multiple Living   Review of Systems  Constitutional: Negative for fever.  Respiratory: Negative for shortness of breath.   Cardiovascular: Negative for chest pain.  Gastrointestinal: Positive for vomiting. Negative for abdominal pain and diarrhea.  Musculoskeletal: Positive for back pain.  All other systems reviewed and are negative.   Allergies  Acetaminophen; Darvocet; Morphine and related; Sulfa antibiotics; Tomato; and Pioglitazone  Home Medications   Prior to Admission medications   Medication Sig Start Date End Date Taking? Authorizing Provider  ciprofloxacin (CIPRO) 500 MG tablet Take 500 mg by mouth 3 (three) times daily.   Yes Historical Provider, MD  glimepiride (AMARYL) 2 MG tablet Take 1 tablet (2 mg total) by mouth daily with breakfast. 12/11/14  Yes Beau [id number]  HYDROcodone-acetaminophen (NORCO) 10-325 MG per tablet Take 1  tablet by mouth every 6 (six) hours as needed for moderate pain.   Yes Historical Provider, MD  acetaminophen (TYLENOL) 500 MG tablet Take 1,000 mg by mouth every 6 (six) hours as needed for mild pain, moderate pain or headache.    Historical Provider, MD  atenolol (TENORMIN) 25 MG tablet Take 1 tablet (25 mg total) by mouth daily. Patient not taking: Reported on 12/21/2014 12/11/14   Beau Fanny, FNP  ibuprofen (ADVIL,MOTRIN) 200 MG tablet Take 400 mg by mouth every 6 (six) hours as needed for headache, mild pain or moderate pain.    Historical Provider, MD   lurasidone 20 MG TABS Take 1 tablet (20 mg total) by mouth daily with breakfast. Patient not taking: Reported on 12/21/2014 12/11/14   Beau Fanny, FNP  nicotine (NICODERM CQ - DOSED IN MG/24 HOURS) 21 mg/24hr patch Place 1 patch (21 mg total) onto the skin daily. Patient not taking: Reported on 12/20/2014 12/11/14   Beau Fanny, FNP  oxyCODONE-acetaminophen (PERCOCET/ROXICET) 5-325 MG per tablet Take 1-2 tablets by mouth every 6 (six) hours as needed. Patient not taking: Reported on 02/04/2015 02/02/15   Donnetta Hutching, MD  predniSONE (DELTASONE) 50 MG tablet 1 tablet for 6 days, one half tablet for 6 days Patient not taking: Reported on 02/04/2015 02/02/15   Donnetta Hutching, MD  promethazine (PHENERGAN) 25 MG tablet Take 1 tablet (25 mg total) by mouth every 6 (six) hours as needed for nausea or vomiting. 02/05/15   Antony Madura, PA-C   BP 127/86 mmHg  Pulse 73  Temp(Src) 97.9 F (36.6 C) (Oral)  Resp 18  SpO2 97%  LMP 01/20/2015   Physical Exam  Constitutional: She is oriented to person, place, and time. She appears well-developed and well-nourished. No distress.  Morbidly obese female  HENT:  Head: Normocephalic and atraumatic.  Eyes: Conjunctivae and EOM are normal. No scleral icterus.  Neck: Normal range of motion.  Cardiovascular: Normal rate, regular rhythm and intact distal pulses.   DP and PT pulses 2+ b/l  Pulmonary/Chest: Effort normal. No respiratory distress.  Respirations even and unlabored  Musculoskeletal: Normal range of motion. She exhibits tenderness.  Tenderness to palpation to lower lumbar midline without bony deformities, step-offs, or crepitus. There is also lumbar paraspinal muscle tenderness on the right. No appreciable spasm.  Neurological: She is alert and oriented to person, place, and time. She exhibits normal muscle tone. Coordination normal.  Sensation to light touch intact in bilateral lower extremities  Skin: Skin is warm and dry. No rash noted. She is not  diaphoretic. No erythema. No pallor.  Psychiatric: She has a normal mood and affect. Her behavior is normal.  Nursing note and vitals reviewed.   ED Course  Procedures (including critical care time) Labs Review Labs Reviewed  LIPASE, BLOOD - Abnormal; Notable for the following:    Lipase 14 (*)    All other components within normal limits  COMPREHENSIVE METABOLIC PANEL - Abnormal; Notable for the following:    Calcium 8.7 (*)    Albumin 3.3 (*)    ALT 62 (*)    Total Bilirubin 0.2 (*)    All other components within normal limits  CBC - Abnormal; Notable for the following:    RDW 15.6 (*)    All other components within normal limits  URINALYSIS, ROUTINE W REFLEX MICROSCOPIC (NOT AT [facility]) - Abnormal; Notable for the following:    APPearance CLOUDY (*)    Leukocytes, UA MODERATE (*)  All other components within normal limits  URINE MICROSCOPIC-ADD ON - Abnormal; Notable for the following:    Squamous Epithelial / LPF MANY (*)    Bacteria, UA FEW (*)    All other components within normal limits  URINE CULTURE    Imaging Review No results found.   I have personally reviewed and evaluated these images and lab results as part of my medical decision-making.   EKG Interpretation None       Medications  ketorolac (TORADOL) injection 60 mg (60 mg Intramuscular Given 02/05/15 0330)  ondansetron (ZOFRAN-ODT) disintegrating tablet 8 mg (8 mg Oral Given 02/05/15 0330)    MDM   Final diagnoses:  Chronic low back pain    Patient with back pain. She reports that her pain feels similar to her known chronic back pain. Patient started on narcotics by her primary doctor 24 hours ago. Patient is neurovascularly intact. No red flags or signs concerning for cauda equina. No fever or leukocytosis today. Urinalysis does not suggest infection. Urine sent for culture. Patient noted to have a negative pregnancy test 2 days ago. RICE protocol indicated and discussed with patient. Will have her  f/u with her doctor in 1 week for recheck. Return precautions given at discharge. Patient discharged in good condition.   Filed Vitals:   02/04/15 2221 02/05/15 0302  BP: 147/106 127/86  Pulse: 97 73  Temp: 97.9 F (36.6 C) 97.9 F (36.6 C)  TempSrc: Oral Oral  Resp: 18 18  SpO2: 96% 97%     Antony Madura, PA-C 02/05/15 0981  Marisa Severin, MD 02/05/15 2108

## 2015-02-07 LAB — URINE CULTURE: Culture: 30000

## 2015-02-09 ENCOUNTER — Encounter (HOSPITAL_COMMUNITY): Payer: Self-pay | Admitting: *Deleted

## 2015-02-09 ENCOUNTER — Emergency Department (HOSPITAL_COMMUNITY)
Admission: EM | Admit: 2015-02-09 | Discharge: 2015-02-09 | Disposition: A | Discharge status: Home / Self Care | Payer: Medicaid Other | Attending: Emergency Medicine | Admitting: Emergency Medicine

## 2015-02-09 ENCOUNTER — Telehealth (HOSPITAL_BASED_OUTPATIENT_CLINIC_OR_DEPARTMENT_OTHER): Payer: Self-pay | Admitting: Emergency Medicine

## 2015-02-09 DIAGNOSIS — Z8619 Personal history of other infectious and parasitic diseases: Secondary | ICD-10-CM | POA: Insufficient documentation

## 2015-02-09 DIAGNOSIS — R1110 Vomiting, unspecified: Secondary | ICD-10-CM | POA: Insufficient documentation

## 2015-02-09 DIAGNOSIS — E669 Obesity, unspecified: Secondary | ICD-10-CM | POA: Diagnosis not present

## 2015-02-09 DIAGNOSIS — Z87442 Personal history of urinary calculi: Secondary | ICD-10-CM | POA: Insufficient documentation

## 2015-02-09 DIAGNOSIS — K029 Dental caries, unspecified: Secondary | ICD-10-CM

## 2015-02-09 DIAGNOSIS — Z79899 Other long term (current) drug therapy: Secondary | ICD-10-CM | POA: Insufficient documentation

## 2015-02-09 DIAGNOSIS — K002 Abnormalities of size and form of teeth: Secondary | ICD-10-CM | POA: Diagnosis not present

## 2015-02-09 DIAGNOSIS — Z8679 Personal history of other diseases of the circulatory system: Secondary | ICD-10-CM | POA: Insufficient documentation

## 2015-02-09 DIAGNOSIS — E119 Type 2 diabetes mellitus without complications: Secondary | ICD-10-CM | POA: Insufficient documentation

## 2015-02-09 DIAGNOSIS — K088 Other specified disorders of teeth and supporting structures: Secondary | ICD-10-CM | POA: Diagnosis present

## 2015-02-09 DIAGNOSIS — K0889 Other specified disorders of teeth and supporting structures: Secondary | ICD-10-CM

## 2015-02-09 DIAGNOSIS — Z720 Tobacco use: Secondary | ICD-10-CM | POA: Diagnosis not present

## 2015-02-09 DIAGNOSIS — F319 Bipolar disorder, unspecified: Secondary | ICD-10-CM | POA: Diagnosis not present

## 2015-02-09 DIAGNOSIS — Z792 Long term (current) use of antibiotics: Secondary | ICD-10-CM | POA: Insufficient documentation

## 2015-02-09 DIAGNOSIS — Z87440 Personal history of urinary (tract) infections: Secondary | ICD-10-CM | POA: Diagnosis not present

## 2015-02-09 DIAGNOSIS — K0381 Cracked tooth: Secondary | ICD-10-CM | POA: Diagnosis not present

## 2015-02-09 MED ORDER — IBUPROFEN 800 MG PO TABS: 800.0000 mg | ORAL_TABLET | Freq: Three times a day (TID) | ORAL | Status: DC

## 2015-02-09 MED ORDER — ONDANSETRON 8 MG PO TBDP: 8.0000 mg | ORAL_TABLET | Freq: Three times a day (TID) | ORAL | Status: DC | PRN

## 2015-02-09 MED ORDER — BUPIVACAINE-EPINEPHRINE (PF) 0.5% -1:200000 IJ SOLN
1.8000 mL | Freq: Once | INTRAMUSCULAR | Status: DC
  Filled 2015-02-09: qty 1.8

## 2015-02-09 MED ORDER — PENICILLIN V POTASSIUM 500 MG PO TABS: 500.0000 mg | ORAL_TABLET | Freq: Three times a day (TID) | ORAL | Status: DC

## 2015-02-09 NOTE — Discharge Instructions (Signed)
Dental Caries  Dental caries (also called tooth decay) is the most common oral disease. It can occur at any age but is more common in children and young adults.   HOW DENTAL CARIES DEVELOPS   The process of decay begins when bacteria and foods (particularly sugars and starches) combine in your mouth to produce plaque. Plaque is a substance that sticks to the hard, outer surface of a tooth (enamel). The bacteria in plaque produce acids that attack enamel. These acids may also attack the root surface of a tooth (cementum) if it is exposed. Repeated attacks dissolve these surfaces and create holes in the tooth (cavities). If left untreated, the acids destroy the other layers of the tooth.   RISK FACTORS   Frequent sipping of sugary beverages.    Frequent snacking on sugary and starchy foods, especially those that easily get stuck in the teeth.    Poor oral hygiene.    Dry mouth.    Substance abuse such as methamphetamine abuse.    Broken or poor-fitting dental restorations.    Eating disorders.    Gastroesophageal reflux disease (GERD).    Certain radiation treatments to the head and neck.  SYMPTOMS  In the early stages of dental caries, symptoms are seldom present. Sometimes white, chalky areas may be seen on the enamel or other tooth layers. In later stages, symptoms may include:   Pits and holes on the enamel.   Toothache after sweet, hot, or cold foods or drinks are consumed.   Pain around the tooth.   Swelling around the tooth.  DIAGNOSIS   Most of the time, dental caries is detected during a regular dental checkup. A diagnosis is made after a thorough medical and dental history is taken and the surfaces of your teeth are checked for signs of dental caries. Sometimes special instruments, such as lasers, are used to check for dental caries. Dental X-ray exams may be taken so that areas not visible to the eye (such as between the contact areas of the teeth) can be checked for cavities.    TREATMENT   If dental caries is in its early stages, it may be reversed with a fluoride treatment or an application of a remineralizing agent at the dental office. Thorough brushing and flossing at home is needed to aid these treatments. If it is in its later stages, treatment depends on the location and extent of tooth destruction:    If a small area of the tooth has been destroyed, the destroyed area will be removed and cavities will be filled with a material such as gold, silver amalgam, or composite resin.    If a large area of the tooth has been destroyed, the destroyed area will be removed and a cap (crown) will be fitted over the remaining tooth structure.    If the center part of the tooth (pulp) is affected, a procedure called a root canal will be needed before a filling or crown can be placed.    If most of the tooth has been destroyed, the tooth may need to be pulled (extracted).  HOME CARE INSTRUCTIONS  You can prevent, stop, or reverse dental caries at home by practicing good oral hygiene. Good oral hygiene includes:   Thoroughly cleaning your teeth at least twice a day with a toothbrush and dental floss.    Using a fluoride toothpaste. A fluoride mouth rinse may also be used if recommended by your dentist or health care provider.    Restricting   the amount of sugary and starchy foods and sugary liquids you consume.    Avoiding frequent snacking on these foods and sipping of these liquids.    Keeping regular visits with a dentist for checkups and cleanings.  PREVENTION    Practice good oral hygiene.   Consider a dental sealant. A dental sealant is a coating material that is applied by your dentist to the pits and grooves of teeth. The sealant prevents food from being trapped in them. It may protect the teeth for several years.   Ask about fluoride supplements if you live in a community without fluorinated water or with water that has a low fluoride content. Use fluoride supplements  as directed by your dentist or health care provider.   Allow fluoride varnish applications to teeth if directed by your dentist or health care provider.  Document Released: 02/17/2002 Document Revised: 10/12/2013 Document Reviewed: 05/30/2012  ExitCare Patient Information 2015 [employer]. This information is not intended to replace advice given to you by your health care provider. Make sure you discuss any questions you have with your health care provider.

## 2015-02-09 NOTE — Telephone Encounter (Signed)
Post ED Visit - Positive Culture Follow-up  Culture report reviewed by antimicrobial stewardship pharmacist:  Wes Dulaney, Pharm.D., BCPS  Celedonio Miyamoto, [address].D., BCPS  Georgina Pillion, Pharm.D., BCPS  [place], [place].D., BCPS, AAHIVP  Estella Husk, Pharm.D., BCPS, AAHIVP  Elder Cyphers, [address].D., BCPS Casilda Carls PharmD  Positive urine culture Enterococcus Treated with ciprofloxacin, organism sensitive to the same and no further patient follow-up is required at this time.  Berle Mull 02/09/2015, 12:12 PM

## 2015-02-09 NOTE — ED Provider Notes (Signed)
CSN: [id number]     Arrival date & time 02/09/15  2055 History  This chart was scribed for [name], working with Mirian Mo, MD by Chestine Spore, ED Scribe. The patient was seen in room WTR8/WTR8 at 9:27 PM.    Chief Complaint  Patient presents with  . Dental Pain      The history is provided by the patient. No language interpreter was used.    [name] is a 29 y.o. female who presents to the Emergency Department complaining of dental pain onset 2 days. Pt reports that her tooth broke 1 week ago and she called her dentist who cannot get her in until 02/28/15. She states that she is having associated symptoms of vomiting x yesterday and fever. Pt notes that zofran works for her nausea. She denies gum swelling/bleeding, sore throat, fever, chills, and any other symptoms. Pt denies allergies to any medications.    Past Medical History  Diagnosis Date  . Depression   . Bipolar 1 disorder, depressed   . PTSD (post-traumatic stress disorder)   . Kidney stone   . Migraine   . Urinary tract infection   . Pregnancy induced hypertension   . Obesity   . UTI (lower urinary tract infection)   . Diabetes mellitus without complication   . Hep C w/o coma, chronic    Past Surgical History  Procedure Laterality Date  . Kidney stones    . Lipotripsy     Family History  Problem Relation Age of Onset  . Rheum arthritis Mother   . Asthma Mother   . Hypertension Mother   . Other Neg Hx   . CAD Other    Social History  Substance Use Topics  . Smoking status: Current Every Day Smoker -- 1.00 packs/day    Types: Cigarettes  . Smokeless tobacco: Never Used  . Alcohol Use: No     Comment: denied any alcohol use   OB History    Gravida Para Term Preterm AB TAB SAB Ectopic Multiple Living   Review of Systems  Constitutional: Negative for fever and chills.  HENT: Positive for dental problem. Negative for drooling, facial swelling, rhinorrhea, sore throat  and trouble swallowing.   Gastrointestinal: Positive for vomiting.      Allergies  Acetaminophen; Darvocet; Morphine and related; Sulfa antibiotics; Tomato; and Pioglitazone  Home Medications   Prior to Admission medications   Medication Sig Start Date End Date Taking? Authorizing Provider  acetaminophen (TYLENOL) 500 MG tablet Take 1,000 mg by mouth every 6 (six) hours as needed for mild pain, moderate pain or headache.    Historical Provider, MD  atenolol (TENORMIN) 25 MG tablet Take 1 tablet (25 mg total) by mouth daily. Patient not taking: Reported on 12/21/2014 12/11/14   Beau Fanny, FNP  ciprofloxacin (CIPRO) 500 MG tablet Take 500 mg by mouth 3 (three) times daily.    Historical Provider, MD  glimepiride (AMARYL) 2 MG tablet Take 1 tablet (2 mg total) by mouth daily with breakfast. 12/11/14   Beau Fanny, FNP  HYDROcodone-acetaminophen (NORCO) 10-325 MG per tablet Take 1 tablet by mouth every 6 (six) hours as needed for moderate pain.    Historical Provider, MD  ibuprofen (ADVIL,MOTRIN) 200 MG tablet Take 400 mg by mouth every 6 (six) hours as needed for headache, mild pain or moderate pain.    Historical Provider, MD  lurasidone 20  MG TABS Take 1 tablet (20 mg total) by mouth daily with breakfast. Patient not taking: Reported on 12/21/2014 12/11/14   Beau Fanny, FNP  nicotine (NICODERM CQ - DOSED IN MG/24 HOURS) 21 mg/24hr patch Place 1 patch (21 mg total) onto the skin daily. Patient not taking: Reported on 12/20/2014 12/11/14   Beau Fanny, FNP  oxyCODONE-acetaminophen (PERCOCET/ROXICET) 5-325 MG per tablet Take 1-2 tablets by mouth every 6 (six) hours as needed. Patient not taking: Reported on 02/04/2015 02/02/15   Donnetta Hutching, MD  predniSONE (DELTASONE) 50 MG tablet 1 tablet for 6 days, one half tablet for 6 days Patient not taking: Reported on 02/04/2015 02/02/15   Donnetta Hutching, MD  promethazine (PHENERGAN) 25 MG tablet Take 1 tablet (25 mg total) by mouth every 6 (six) hours as  needed for nausea or vomiting. 02/05/15   Antony Madura, PA-C   BP 121/84 mmHg  Pulse 87  Temp(Src) 98.4 F (36.9 C) (Oral)  Resp 18  SpO2 100%  LMP 01/20/2015 Physical Exam  Constitutional: She is oriented to person, place, and time. She appears well-developed and well-nourished. No distress.  HENT:  Head: Normocephalic and atraumatic.  Generally poor dentition with missing molar teeth bilaterally. Fracture to #31 without draining or pointing abscess.  Eyes: EOM are normal.  Neck: Neck supple. No tracheal deviation present.  Cardiovascular: Normal rate.   Pulmonary/Chest: Effort normal. No respiratory distress.  Musculoskeletal: Normal range of motion.  Neurological: She is alert and oriented to person, place, and time.  Skin: Skin is warm and dry.  Psychiatric: She has a normal mood and affect. Her behavior is normal.  Nursing note and vitals reviewed.   ED Course  Procedures (including critical care time) DIAGNOSTIC STUDIES: Oxygen Saturation is 97% on RA, nl by my interpretation.    COORDINATION OF CARE: 9:30 PM Discussed treatment plan with pt at bedside and pt agreed to plan.    Labs Review Labs Reviewed - No data to display  Imaging Review No results found. I have personally reviewed and evaluated these images and lab results as part of my medical decision-making.   EKG Interpretation None      MDM   Final diagnoses:  None    1. Dental caries 2. Dental pain  Apical injection provided in the ED for pain. Rx for ibuprofen, penicillin. Dental f/u encouraged.  I personally performed the services described in this documentation, which was scribed in my presence. The recorded information has been reviewed and is accurate.     [name], PA-C 02/09/15 2135  Pricilla Loveless, MD 02/12/15 [id number]

## 2015-02-09 NOTE — ED Notes (Signed)
Pt c/o dental pain x 2 days; pt with widespread dental decay and missing teeth; pt states that the area was "leaking" yesterday; pt c/o vomiting yesterday

## 2015-04-25 ENCOUNTER — Encounter (HOSPITAL_COMMUNITY): Payer: Self-pay | Admitting: Emergency Medicine

## 2015-04-25 ENCOUNTER — Emergency Department (HOSPITAL_COMMUNITY)
Admission: EM | Admit: 2015-04-25 | Discharge: 2015-04-25 | Disposition: A | Discharge status: Home / Self Care | Payer: Medicaid Other | Attending: Emergency Medicine | Admitting: Emergency Medicine

## 2015-04-25 DIAGNOSIS — Z3202 Encounter for pregnancy test, result negative: Secondary | ICD-10-CM | POA: Diagnosis not present

## 2015-04-25 DIAGNOSIS — R1030 Lower abdominal pain, unspecified: Secondary | ICD-10-CM

## 2015-04-25 DIAGNOSIS — F319 Bipolar disorder, unspecified: Secondary | ICD-10-CM | POA: Insufficient documentation

## 2015-04-25 DIAGNOSIS — Z8679 Personal history of other diseases of the circulatory system: Secondary | ICD-10-CM | POA: Insufficient documentation

## 2015-04-25 DIAGNOSIS — R109 Unspecified abdominal pain: Secondary | ICD-10-CM

## 2015-04-25 DIAGNOSIS — E119 Type 2 diabetes mellitus without complications: Secondary | ICD-10-CM | POA: Diagnosis not present

## 2015-04-25 DIAGNOSIS — Z8619 Personal history of other infectious and parasitic diseases: Secondary | ICD-10-CM | POA: Diagnosis not present

## 2015-04-25 DIAGNOSIS — E669 Obesity, unspecified: Secondary | ICD-10-CM | POA: Diagnosis not present

## 2015-04-25 DIAGNOSIS — F17210 Nicotine dependence, cigarettes, uncomplicated: Secondary | ICD-10-CM | POA: Insufficient documentation

## 2015-04-25 DIAGNOSIS — Z79899 Other long term (current) drug therapy: Secondary | ICD-10-CM | POA: Diagnosis not present

## 2015-04-25 DIAGNOSIS — Z87442 Personal history of urinary calculi: Secondary | ICD-10-CM | POA: Diagnosis not present

## 2015-04-25 DIAGNOSIS — N390 Urinary tract infection, site not specified: Secondary | ICD-10-CM | POA: Diagnosis not present

## 2015-04-25 DIAGNOSIS — R1032 Left lower quadrant pain: Secondary | ICD-10-CM | POA: Diagnosis present

## 2015-04-25 LAB — URINALYSIS, ROUTINE W REFLEX MICROSCOPIC
BILIRUBIN URINE: NEGATIVE
Glucose, UA: NEGATIVE mg/dL
HGB URINE DIPSTICK: NEGATIVE
Ketones, ur: NEGATIVE mg/dL
Nitrite: NEGATIVE
PROTEIN: NEGATIVE mg/dL
Specific Gravity, Urine: 1.021 (ref 1.005–1.030)
UROBILINOGEN UA: 1 mg/dL (ref 0.0–1.0)
pH: 7.5 (ref 5.0–8.0)

## 2015-04-25 LAB — HEPATIC FUNCTION PANEL
ALT: 27 U/L (ref 14–54)
AST: 28 U/L (ref 15–41)
Albumin: 3.2 g/dL — ABNORMAL LOW (ref 3.5–5.0)
Alkaline Phosphatase: 60 U/L (ref 38–126)
BILIRUBIN DIRECT: 0.1 mg/dL (ref 0.1–0.5)
BILIRUBIN INDIRECT: 0.5 mg/dL (ref 0.3–0.9)
TOTAL PROTEIN: 6.9 g/dL (ref 6.5–8.1)
Total Bilirubin: 0.6 mg/dL (ref 0.3–1.2)

## 2015-04-25 LAB — CBC
HCT: 40.8 % (ref 36.0–46.0)
Hemoglobin: 12.9 g/dL (ref 12.0–15.0)
MCH: 26.6 pg (ref 26.0–34.0)
MCHC: 31.6 g/dL (ref 30.0–36.0)
MCV: 84.1 fL (ref 78.0–100.0)
PLATELETS: 269 10*3/uL (ref 150–400)
RBC: 4.85 MIL/uL (ref 3.87–5.11)
RDW: 15.3 % (ref 11.5–15.5)
WBC: 12.7 10*3/uL — AB (ref 4.0–10.5)

## 2015-04-25 LAB — LIPASE, BLOOD: Lipase: 23 U/L (ref 11–51)

## 2015-04-25 LAB — URINE MICROSCOPIC-ADD ON

## 2015-04-25 LAB — BASIC METABOLIC PANEL
Anion gap: 8 (ref 5–15)
BUN: 9 mg/dL (ref 6–20)
CHLORIDE: 102 mmol/L (ref 101–111)
CO2: 27 mmol/L (ref 22–32)
CREATININE: 0.73 mg/dL (ref 0.44–1.00)
Calcium: 8.5 mg/dL — ABNORMAL LOW (ref 8.9–10.3)
Glucose, Bld: 144 mg/dL — ABNORMAL HIGH (ref 65–99)
POTASSIUM: 3.4 mmol/L — AB (ref 3.5–5.1)
SODIUM: 137 mmol/L (ref 135–145)

## 2015-04-25 LAB — I-STAT BETA HCG BLOOD, ED (MC, WL, AP ONLY)

## 2015-04-25 LAB — WET PREP, GENITAL
Trich, Wet Prep: NONE SEEN
Yeast Wet Prep HPF POC: NONE SEEN

## 2015-04-25 MED ORDER — METRONIDAZOLE 500 MG PO TABS: 500.0000 mg | ORAL_TABLET | Freq: Two times a day (BID) | ORAL | Status: DC

## 2015-04-25 MED ORDER — HYDROMORPHONE HCL 1 MG/ML IJ SOLN
1.0000 mg | Freq: Once | INTRAMUSCULAR | Status: AC
  Administered 2015-04-25: 1 mg via INTRAVENOUS
  Filled 2015-04-25: qty 1

## 2015-04-25 MED ORDER — CEPHALEXIN 500 MG PO CAPS: 500.0000 mg | ORAL_CAPSULE | Freq: Two times a day (BID) | ORAL | Status: DC

## 2015-04-25 MED ORDER — DEXTROSE 5 % IV SOLN
1.0000 g | Freq: Once | INTRAVENOUS | Status: AC
  Administered 2015-04-25: 1 g via INTRAVENOUS
  Filled 2015-04-25: qty 10

## 2015-04-25 MED ORDER — ONDANSETRON HCL 4 MG/2ML IJ SOLN
4.0000 mg | Freq: Once | INTRAMUSCULAR | Status: AC
Start: 2015-04-25 — End: 2015-04-25
  Administered 2015-04-25: 4 mg via INTRAVENOUS
  Filled 2015-04-25: qty 2

## 2015-04-25 MED ORDER — OXYCODONE-ACETAMINOPHEN 5-325 MG PO TABS: 1.0000 | ORAL_TABLET | Freq: Four times a day (QID) | ORAL | Status: DC | PRN

## 2015-04-25 MED ORDER — SODIUM CHLORIDE 0.9 % IV BOLUS (SEPSIS)
1000.0000 mL | Freq: Once | INTRAVENOUS | Status: AC
  Administered 2015-04-25: 1000 mL via INTRAVENOUS

## 2015-04-25 MED ORDER — HYDROMORPHONE HCL 1 MG/ML IJ SOLN
1.0000 mg | Freq: Once | INTRAMUSCULAR | Status: AC
Start: 2015-04-25 — End: 2015-04-25
  Administered 2015-04-25: 1 mg via INTRAVENOUS
  Filled 2015-04-25: qty 1

## 2015-04-25 MED ORDER — DOXYCYCLINE HYCLATE 100 MG PO CAPS: 100.0000 mg | ORAL_CAPSULE | Freq: Two times a day (BID) | ORAL | Status: DC

## 2015-04-25 NOTE — ED Notes (Signed)
Pt c/o left flank pain that radiates around to lower abd area on left side.  Pt c/o hurts when she walks.  Pt denies any urinary problem.

## 2015-04-25 NOTE — ED Provider Notes (Signed)
CSN: [id number]     Arrival date & time 04/25/15  0957 History   First MD Initiated Contact with Patient 04/25/15 1045     Chief Complaint  Patient presents with  . Flank Pain     (Consider location/radiation/quality/duration/timing/severity/associated sxs/prior Treatment) HPI  [name] is a 29 y.o. female with PMH significant for Hep C, nephrolithiasis, obesity, PTSD, Bipolar disorder who presents with progressively worsening left flank pain x 1 day that radiates to her left lower abdomen and pelvic region.  She states it started out waxing and waning, and is now constant.  She describes it as stabbing.  PTA tried ibuprofen, heating pads, and warm baths.  Nothing makes it better.  Walking makes it worse.  Not associated with eating.  Associated symptoms include N/V, chills, fever (101.2 yesterday), decreased appetite, urinary hesitancy, and vaginal discharge (yellowish)  Past Medical History  Diagnosis Date  . Depression   . Bipolar 1 disorder, depressed (HCC)   . PTSD (post-traumatic stress disorder)   . Kidney stone   . Migraine   . Urinary tract infection   . Pregnancy induced hypertension   . Obesity   . UTI (lower urinary tract infection)   . Diabetes mellitus without complication (HCC)   . Hep C w/o coma, chronic (HCC)    Past Surgical History  Procedure Laterality Date  . Kidney stones    . Lipotripsy     Family History  Problem Relation Age of Onset  . Rheum arthritis Mother   . Asthma Mother   . Hypertension Mother   . Other Neg Hx   . CAD Other    Social History  Substance Use Topics  . Smoking status: Current Every Day Smoker -- 1.00 packs/day    Types: Cigarettes  . Smokeless tobacco: Never Used  . Alcohol Use: No     Comment: denied any alcohol use   OB History    Gravida Para Term Preterm AB TAB SAB Ectopic Multiple Living   Review of Systems All other systems negative unless otherwise stated in HPI   Allergies   Acetaminophen; Darvocet; Morphine and related; Sulfa antibiotics; Tomato; and Pioglitazone  Home Medications   Prior to Admission medications   Medication Sig Start Date End Date Taking? Authorizing Provider  ALPRAZolam Prudy Feeler) 1 MG tablet Take 1 mg by mouth 3 (three) times daily.   Yes Historical Provider, MD  atenolol (TENORMIN) 25 MG tablet Take 1 tablet (25 mg total) by mouth daily. 12/11/14  Yes Beau Fanny, FNP  DULoxetine (CYMBALTA) 20 MG capsule Take 20 mg by mouth daily.   Yes Historical Provider, MD  HYDROcodone-acetaminophen (NORCO) 10-325 MG per tablet Take 1 tablet by mouth every 6 (six) hours as needed for moderate pain.   Yes Historical Provider, MD  promethazine (PHENERGAN) 25 MG tablet Take 1 tablet (25 mg total) by mouth every 6 (six) hours as needed for nausea or vomiting. 02/05/15  Yes Antony Madura, PA-C  cephALEXin (KEFLEX) 500 MG capsule Take 1 capsule (500 mg total) by mouth 2 (two) times daily. 04/25/15   Cheri Fowler, PA-C  doxycycline (VIBRAMYCIN) 100 MG capsule Take 1 capsule (100 mg total) by mouth 2 (two) times daily. 04/25/15   Cheri Fowler, PA-C  glimepiride (AMARYL) 2 MG tablet Take 1 tablet (2 mg total) by mouth daily with breakfast. Patient not taking: Reported on 04/25/2015 12/11/14   Beau Fanny,  FNP  ibuprofen (ADVIL,MOTRIN) 800 MG tablet Take 1 tablet (800 mg total) by mouth 3 (three) times daily. Patient not taking: Reported on 04/25/2015 02/09/15   Elpidio AnisShari Upstill, PA-C  lurasidone 20 MG TABS Take 1 tablet (20 mg total) by mouth daily with breakfast. Patient not taking: Reported on 12/21/2014 12/11/14   Beau FannyJohn C Withrow, FNP  metroNIDAZOLE (FLAGYL) 500 MG tablet Take 1 tablet (500 mg total) by mouth 2 (two) times daily. 04/25/15   Cheri FowlerKayla Janitza Revuelta, PA-C  nicotine (NICODERM CQ - DOSED IN MG/24 HOURS) 21 mg/24hr patch Place 1 patch (21 mg total) onto the skin daily. Patient not taking: Reported on 12/20/2014 12/11/14   Beau FannyJohn C Withrow, FNP  ondansetron (ZOFRAN ODT) 8 MG  disintegrating tablet Take 1 tablet (8 mg total) by mouth every 8 (eight) hours as needed for nausea or vomiting. Patient not taking: Reported on 04/25/2015 02/09/15   Elpidio AnisShari Upstill, PA-C  oxyCODONE-acetaminophen (PERCOCET/ROXICET) 5-325 MG tablet Take 1-2 tablets by mouth every 6 (six) hours as needed for severe pain. 04/25/15   Cheri FowlerKayla Adine Heimann, PA-C  penicillin v potassium (VEETID) 500 MG tablet Take 1 tablet (500 mg total) by mouth 3 (three) times daily. Patient not taking: Reported on 04/25/2015 02/09/15   Elpidio AnisShari Upstill, PA-C  predniSONE (DELTASONE) 50 MG tablet 1 tablet for 6 days, one half tablet for 6 days Patient not taking: Reported on 02/04/2015 02/02/15   Donnetta HutchingBrian Cook, MD   BP 97/69 mmHg  Pulse 79  Temp(Src) 99.2 F (37.3 C) (Temporal)  Resp 20  SpO2 98%  LMP 03/14/2015 Physical Exam  Constitutional: She is oriented to person, place, and time. She appears well-developed and well-nourished.  HENT:  Head: Normocephalic and atraumatic.  Mouth/Throat: Oropharynx is clear and moist.  Eyes: Conjunctivae are normal. Pupils are equal, round, and reactive to light.  Neck: Normal range of motion. Neck supple.  Cardiovascular: Normal rate, regular rhythm and normal heart sounds.   No murmur heard. Pulmonary/Chest: Effort normal and breath sounds normal. No accessory muscle usage or stridor. No respiratory distress. She has no wheezes. She has no rhonchi. She has no rales.  Abdominal: Soft. Bowel sounds are normal. She exhibits no distension. There is tenderness in the suprapubic area and left lower quadrant. There is no rigidity, no rebound and no guarding.  Genitourinary: Uterus normal. There is no tenderness or lesion on the right labia. There is no tenderness or lesion on the left labia. Cervix exhibits motion tenderness and discharge. Right adnexum displays tenderness. Left adnexum displays tenderness. Vaginal discharge found.  Musculoskeletal: Normal range of motion.  Lymphadenopathy:    She  has no cervical adenopathy.  Neurological: She is alert and oriented to person, place, and time.  Speech clear without dysarthria.  Skin: Skin is warm and dry.  Psychiatric: She has a normal mood and affect. Her behavior is normal.    ED Course  Procedures (including critical care time) Labs Review Labs Reviewed  WET PREP, GENITAL - Abnormal; Notable for the following:    Clue Cells Wet Prep HPF POC FEW (*)    WBC, Wet Prep HPF POC TOO NUMEROUS TO COUNT (*)    All other components within normal limits  URINALYSIS, ROUTINE W REFLEX MICROSCOPIC (NOT [facility]) - Abnormal; Notable for the following:    APPearance TURBID (*)    Leukocytes, UA LARGE (*)    All other components within normal limits  BASIC METABOLIC PANEL - Abnormal; Notable for the following:    Potassium 3.4 (*)  Glucose, Bld 144 (*)    Calcium 8.5 (*)    All other components within normal limits  CBC - Abnormal; Notable for the following:    WBC 12.7 (*)    All other components within normal limits  URINE MICROSCOPIC-ADD ON - Abnormal; Notable for the following:    Bacteria, UA MANY (*)    All other components within normal limits  HEPATIC FUNCTION PANEL - Abnormal; Notable for the following:    Albumin 3.2 (*)    All other components within normal limits  LIPASE, BLOOD  RPR  HIV ANTIBODY (ROUTINE TESTING)  I-STAT BETA HCG BLOOD, ED ([facility], [facility], [facility] ONLY)  GC/CHLAMYDIA PROBE AMP ([facility]) NOT AT [facility]    Imaging Review No results found. I have personally reviewed and evaluated these images and lab results as part of my medical decision-making.   EKG Interpretation None      MDM   Final diagnoses:  Left flank pain  Lower abdominal pain  UTI (lower urinary tract infection)    Patient presents with left lower back/flank pain that radiates to the left lower abdomen and pelvic region x 1 day.  VSS, NAD, appears non-toxic.  On exam, heart RRR, lungs CTAB, abdomen has tenderness in the suprapubic region.   Cervical discharge present.  She has CMT and adnexal tenderness. Suspect PID.  Will treat with rocephin and d/c home with doxycycline and flagyl.  Will also treat UTI with kelfex.  Evaluation does not show pathology requring ongoing emergent intervention or admission. Pt is hemodynamically stable and mentating appropriately. Discussed findings/results and plan with patient/guardian, who agrees with plan. All questions answered. Return precautions discussed and outpatient follow up given.   Case has been discussed with Dr. Fayrene Fearing who agrees with the above plan for discharge.       Cheri Fowler, PA-C 04/25/15 1418  Rolland Porter, MD 04/27/15 [phone]

## 2015-04-25 NOTE — Discharge Instructions (Signed)
Pelvic Inflammatory Disease    Urinary Tract Infection A urinary tract infection (UTI) can occur any place along the urinary tract. The tract includes the kidneys, ureters, bladder, and urethra. A type of germ called bacteria often causes a UTI. UTIs are often helped with antibiotic medicine.  HOME CARE   If given, take antibiotics as told by your doctor. Finish them even if you start to feel better.  Drink enough fluids to keep your pee (urine) clear or pale yellow.  Avoid tea, drinks with caffeine, and bubbly (carbonated) drinks.  Pee often. Avoid holding your pee in for a long time.  Pee before and after having sex (intercourse).  Wipe from front to back after you poop (bowel movement) if you are a woman. Use each tissue only once. GET HELP RIGHT AWAY IF:   You have back pain.  You have lower belly (abdominal) pain.  You have chills.  You feel sick to your stomach (nauseous).  You throw up (vomit).  Your burning or discomfort with peeing does not go away.  You have a fever.  Your symptoms are not better in 3 days. MAKE SURE YOU:   Understand these instructions.  Will watch your condition.  Will get help right away if you are not doing well or get worse.   This information is not intended to replace advice given to you by your health care provider. Make sure you discuss any questions you have with your health care provider.   Document Released: 11/14/2007 Document Revised: 06/18/2014 Document Reviewed: 12/27/2011 Elsevier Interactive Patient Education 2016 Elsevier Inc. Pelvic inflammatory disease (PID) refers to an infection in some or all of the female organs. The infection can be in the uterus, ovaries, fallopian tubes, or the surrounding tissues in the pelvis. PID can cause abdominal or pelvic pain that comes on suddenly (acute pelvic pain). PID is a serious infection because it can lead to lasting (chronic) pelvic pain or the inability to have children  (infertility).  CAUSES  This condition is most often caused by an infection that is spread during sexual contact. However, the infection can also be caused by the normal bacteria that are found in the vaginal tissues if these bacteria travel upward into the reproductive organs. PID can also occur following:  The birth of a baby.  A miscarriage.  An abortion.  Major pelvic surgery.  The use of an intrauterine device (IUD).  A sexual assault. RISK FACTORS  This condition is more likely to develop in women who:  Are younger than 31 years of age.  Are sexually active at a young age.  Use nonbarrier contraception.  Have multiple sexual partners.  Have sex with someone who has symptoms of an STD (sexually transmitted disease).  Use oral contraception. At times, certain behaviors can also increase the possibility of getting PID, such as:  Using a vaginal douche.  Having an IUD in place. SYMPTOMS  Symptoms of this condition include:  Abdominal or pelvic pain.  Fever.  Chills.  Abnormal vaginal discharge.  Abnormal uterine bleeding.  Unusual pain shortly after the end of a menstrual period.  Painful urination.  Pain with sexual intercourse.  Nausea and vomiting. DIAGNOSIS  To diagnose this condition, your health care provider will do a physical exam and take your medical history. A pelvic exam typically reveals great tenderness in the uterus and the surrounding pelvic tissues. You may also have tests, such as:  Lab tests, including a pregnancy test, blood tests, and urine  test.  Culture tests of the vagina and cervix to check for an STD.  Ultrasound.  A laparoscopic procedure to look inside the pelvis.  Examining vaginal secretions under a microscope. TREATMENT  Treatment for this condition may involve one or more approaches.  Antibiotic medicines may be prescribed to be taken by mouth.  Sexual partners may need to be treated if the infection is caused by an STD.  For more severe  cases, hospitalization may be needed to give antibiotics directly into a vein through an IV tube.  Surgery may be needed if other treatments do not help, but this is rare. It may take weeks until you are completely well. If you are diagnosed with PID, you should also be checked for human immunodeficiency virus (HIV). Your health care provider may test you for infection again 3 months after treatment. You should not have unprotected sex.  HOME CARE INSTRUCTIONS  Take over-the-counter and prescription medicines only as told by your health care provider.  If you were prescribed an antibiotic medicine, take it as told by your health care provider. Do not stop taking the antibiotic even if you start to feel better.  Do not have sexual intercourse until treatment is completed or as told by your health care provider. If PID is confirmed, your recent sexual partners will need treatment, especially if you had unprotected sex.  Keep all follow-up visits as told by your health care provider. This is important. SEEK MEDICAL CARE IF:  You have increased or abnormal vaginal discharge.  Your pain does not improve.  You vomit.  You have a fever.  You cannot tolerate your medicines.  Your partner has an STD.  You have pain when you urinate. SEEK IMMEDIATE MEDICAL CARE IF:  You have increased abdominal or pelvic pain.  You have chills.  Your symptoms are not better in 72 hours even with treatment. This information is not intended to replace advice given to you by your health care provider. Make sure you discuss any questions you have with your health care provider.  Document Released: 05/28/2005 Document Revised: 02/16/2015 Document Reviewed: 07/05/2014  Elsevier Interactive Patient Education 2016 [employer].    Emergency Department Resource Guide 1) Find a Doctor and Pay Out of Pocket Although you won't have to find out who is covered by your insurance plan, it is a good idea to ask around and get  recommendations. You will then need to call the office and see if the doctor you have chosen will accept you as a new patient and what types of options they offer for patients who are self-pay. Some doctors offer discounts or will set up payment plans for their patients who do not have insurance, but you will need to ask so you aren't surprised when you get to your appointment.  2) Contact Your Local Health Department Not all health departments have doctors that can see patients for sick visits, but many do, so it is worth a call to see if yours does. If you don't know where your local health department is, you can check in your phone book. The CDC also has a tool to help you locate your state's health department, and many state websites also have listings of all of their local health departments.  3) Find a Walk-in Clinic If your illness is not likely to be very severe or complicated, you may want to try a walk in clinic. These are popping up all over the country in pharmacies, drugstores, and  shopping centers. They're usually staffed by nurse practitioners or physician assistants that have been trained to treat common illnesses and complaints. They're usually fairly quick and inexpensive. However, if you have serious medical issues or chronic medical problems, these are probably not your best option.  No Primary Care Doctor: - Call Health Connect at  [phone] - they can help you locate a primary care doctor that  accepts your insurance, provides certain services, etc. - Physician Referral Service- [phone]  Chronic Pain Problems: Organization         Address  Phone   Notes  [facility]  [phone] Patients need to be referred by their primary care doctor.   Medication Assistance: Organization         Address  Phone   Notes  [facility] Medication [facility] [address] [place] [place], [place] [address] [phone] --Must be a resident of  [facility] -- Must have NO insurance coverage whatsoever (no Medicaid/ Medicare, etc.) -- The pt. MUST have a primary care doctor that directs their care regularly and follows them in the community   MedAssist  [phone]   [employer]  [phone]    Agencies that provide inexpensive medical care: Organization         Address  Phone   Notes  [facility]  [phone]   [facility]    [phone]   [facility] [address] [place], [place] [address] [phone]   [facility] [address] [place], [place] [phone]   Planned Parenthood    [phone]   [facility]    [phone]   [facility]  [address], [place] Phone:  [phone], Fax:  [phone] Hours of Operation:  9 am - 6 pm, M-F.  Also accepts Medicaid/Medicare and self-pay.  [facility]  [address], [place] Phone: [phone], Fax: [phone]. Hours of Operation:  8:30 am - 5:30 pm, M-F.  Also accepts Medicaid and self-pay.  [facility] [place] [address] [place] Phone: [phone]   [facility] [address] Natasha Bence [place], [place] [phone], Ext. [phone] Mondays & Thursdays: 7-9 AM.  First 15 patients are seen on a first come, first serve basis.    Medicaid-accepting [facility] Providers:  Organization         Address  Phone   Notes  [facility] [address], [place] [phone] Also accepts self-pay patients.  [facility] [address] Laurell Josephs [place], [place]  [phone]   [facility] [address], [place] [phone]   [facility] [address], [place] [phone]   Renaye Rakers [address], [place]   [phone] Only accepts [place] Access  [place] patients after they have their name applied to their card.   Self-Pay (no insurance) in [facility]:  Organization         Address  Phone   Notes  Sickle Cell Patients, [facility] Internal Medicine [address] [place], [place] [phone]   [facility] [address] [place], [place] [phone]   [facility] [place]  [address], [place] [phone]  [facility]/Dr. Osei-Bonsu  [address], [place] or [address], [place] [phone] Phone number for both [place] and [place] locations is the same.  [facility] [address] [place] [phone]   [facility] [address] [place] or [address] [phone]   [facility] [address], [place] [phone], phone; [phone], fax Sees patients 1st and 3rd Saturday of every month.  Must not qualify for public or private insurance (i.e. Medicaid, Medicare, [place] Health Choice, Veterans' Benefits)  Household income should be no more than 200% of the poverty level The clinic cannot treat you if you are pregnant or think you are pregnant  Sexually transmitted diseases are not treated at the clinic.    Dental Care: Organization         Address  Phone  Notes  [facility] Department of [facility] [place] [phone] Accepts children up to age 9 who are enrolled in [place] or [place]; pregnant women with a Medicaid card; and children who have applied for Medicaid or [place] Health Choice, but were declined, whose parents can pay a reduced fee at time of service.  [facility] Department of [facility]  [address], [place] [phone] Accepts children up to age 27 who are enrolled in [place] or [place]; pregnant women with a Medicaid  card; and children who have applied for Medicaid or [place] Health Choice, but were declined, whose parents can pay a reduced fee at time of service.  [place] Adult [facility] Access PROGRAM  [place] [phone] Patients are seen by appointment only. Walk-ins are not accepted. Jane Lew will see patients 76 years of age and older. Monday - Tuesday (8am-5pm) Most Wednesdays (8:30-5pm) $30 per visit, cash only  [facility] Adult Dental Access PROGRAM  [address], [facility] [phone] Patients are seen by appointment only. Walk-ins are not accepted. [place] will see patients 18 years of age and older. One Wednesday Evening (Monthly: Volunteer Based).  $30 per visit, cash only  [place]  [phone] for adults; Children under age 51, call Graduate Pediatric Dentistry at [phone]. Children aged 2-14, please call [phone] to request a pediatric application.  Dental services are provided in all areas of dental care including fillings, crowns and bridges, complete and partial dentures, implants, gum treatment, root canals, and extractions. Preventive care is also provided. Treatment is provided to both adults and children. Patients are selected via a lottery and there is often a waiting list.   [facility] [address], [place]  [phone] [email or web address]   [facility] [address] [place], [place] [phone], Ext. [phone] Second and Fourth Thursday of each month, opens at 6:30 AM; Clinic ends at 9 AM.  Patients are seen on a first-come first-served basis, and a limited number are seen during each clinic.   [facility]  [address] Hillard Danker [place], [place] [phone]   Eligibility Requirements You must have lived in [place], [place], or [place] counties for at least the last three months.   You cannot be eligible for state or federal sponsored [employer],  including [employer], [place], or [employer].   You generally cannot be eligible for healthcare insurance  through your employer.    How to apply: Eligibility screenings are held every Tuesday and Wednesday afternoon from 1:00 pm until 4:00 pm. You do not need an appointment for the interview!  [facility] [address], [place], [place]   [place]  [place] Department  [place]  [phone]    Behavioral Health Resources in the Community: Intensive Outpatient Programs Organization         Address  Phone  Notes  [place]t [place]. [address], [place], [place] [phone]   [facility] [address] [place], [place]   ADS: [facility] Drug [facility] [address] [place], [place]   [place] [address]  [place], [place] or [phone]   Substance Abuse Resources Organization         Address  Phone  Notes  Alcohol and Drug Services  [phone]   [place]  [phone]   [facility] [place]   Chinita Pester  [phone]   Residential & Outpatient Substance Abuse Program  [phone]   Psychological Services Organization         Address  Phone  Notes  [facility] [place]  [place]  [phone]   [place] [address] [place], [place] or [phone]    Mobile Crisis Teams Organization         Address  Phone  Notes  [facility], Mobile Crisis Care Unit  [phone]   [facility]  [address] [place], [place]   [place] Levels [address], [place] [phone]    Self-Help/Support Groups Organization         Address  Phone             Notes  [place].  of [place] - variety of support groups  [place] Call for more information  Narcotics Anonymous (NA), [facility] [address], [employer] [place]  2 meetings at this location   Special educational needs teacher         Address  Phone  Notes  [facility]AP Residential Treatment [place],    [place]  [phone]   [facility]  [address], [place] [phone], [place], [place]   [place], [place] [phone] Admissions: 8am-3pm M-F  Incentives Substance [place] [address],    [place], [place] [phone]   [facility] [address] [place], [place], [place]   [facility] [address]  [place], [place]   [facility] - Intensive Outpatient [address] Kristeen [address], [place], [place]   [facility] [place]  [place], [place] [phone] or [phone]   [facility] ([facility]) [address] [place], [place] Accepts Medicaid  [facility] [place] [address],  [place] [phone] Substance Abuse/Addiction Treatment   [facility] Organization         Address  Phone  Notes  [facility]  [phone]   Domenic Schwab, PhD [address] [place], [place]   [phone] or [phone]   [facility] [place], [place] [phone]  [facility] [address], [place], [place] [phone] Insurance/Medicaid/sponsorship through [employer]  [facility] Families [address]                                    [place], [place] [phone] Therapy/tele-psych/case  [facility] [address] [place] [address]   [place], [place] [phone]    Dr. Lolly MustacheArfeen  [phone]   [facility]  [employer] [facility] 1) [address], [place] 2)  [address], [place] 3)  [address], [place] [phone]  [phone]   [facility] Child Abuse Hotline [phone](336) [phone] or [phone]) [phone] (After Hours)

## 2015-04-25 NOTE — ED Notes (Signed)
Bed: WA21 Expected date:  Expected time:  Means of arrival:  Comments: 

## 2015-04-26 LAB — HIV ANTIBODY (ROUTINE TESTING W REFLEX): HIV SCREEN 4TH GENERATION: NONREACTIVE

## 2015-04-26 LAB — GC/CHLAMYDIA PROBE AMP (~~LOC~~) NOT AT ARMC
CHLAMYDIA, DNA PROBE: NEGATIVE
NEISSERIA GONORRHEA: NEGATIVE

## 2015-04-26 LAB — RPR: RPR Ser Ql: NONREACTIVE

## 2015-06-01 ENCOUNTER — Encounter (HOSPITAL_COMMUNITY): Payer: Self-pay | Admitting: *Deleted

## 2015-06-01 ENCOUNTER — Emergency Department (HOSPITAL_COMMUNITY)
Admission: EM | Admit: 2015-06-01 | Discharge: 2015-06-01 | Disposition: A | Discharge status: Home / Self Care | Payer: Medicaid Other | Attending: Emergency Medicine | Admitting: Emergency Medicine

## 2015-06-01 ENCOUNTER — Emergency Department (HOSPITAL_COMMUNITY): Payer: Medicaid Other

## 2015-06-01 DIAGNOSIS — E119 Type 2 diabetes mellitus without complications: Secondary | ICD-10-CM | POA: Insufficient documentation

## 2015-06-01 DIAGNOSIS — Z8619 Personal history of other infectious and parasitic diseases: Secondary | ICD-10-CM | POA: Diagnosis not present

## 2015-06-01 DIAGNOSIS — R1010 Upper abdominal pain, unspecified: Secondary | ICD-10-CM

## 2015-06-01 DIAGNOSIS — Z87442 Personal history of urinary calculi: Secondary | ICD-10-CM | POA: Insufficient documentation

## 2015-06-01 DIAGNOSIS — E669 Obesity, unspecified: Secondary | ICD-10-CM | POA: Insufficient documentation

## 2015-06-01 DIAGNOSIS — F319 Bipolar disorder, unspecified: Secondary | ICD-10-CM | POA: Diagnosis not present

## 2015-06-01 DIAGNOSIS — Z3202 Encounter for pregnancy test, result negative: Secondary | ICD-10-CM | POA: Diagnosis not present

## 2015-06-01 DIAGNOSIS — Z79899 Other long term (current) drug therapy: Secondary | ICD-10-CM | POA: Diagnosis not present

## 2015-06-01 DIAGNOSIS — G43909 Migraine, unspecified, not intractable, without status migrainosus: Secondary | ICD-10-CM | POA: Diagnosis not present

## 2015-06-01 DIAGNOSIS — Z87440 Personal history of urinary (tract) infections: Secondary | ICD-10-CM | POA: Diagnosis not present

## 2015-06-01 DIAGNOSIS — R1011 Right upper quadrant pain: Secondary | ICD-10-CM | POA: Diagnosis present

## 2015-06-01 DIAGNOSIS — F17210 Nicotine dependence, cigarettes, uncomplicated: Secondary | ICD-10-CM | POA: Diagnosis not present

## 2015-06-01 LAB — CBC
HEMATOCRIT: 39.7 % (ref 36.0–46.0)
Hemoglobin: 12.7 g/dL (ref 12.0–15.0)
MCH: 26.8 pg (ref 26.0–34.0)
MCHC: 32 g/dL (ref 30.0–36.0)
MCV: 83.9 fL (ref 78.0–100.0)
PLATELETS: 272 10*3/uL (ref 150–400)
RBC: 4.73 MIL/uL (ref 3.87–5.11)
RDW: 14.9 % (ref 11.5–15.5)
WBC: 9.1 10*3/uL (ref 4.0–10.5)

## 2015-06-01 LAB — URINALYSIS, ROUTINE W REFLEX MICROSCOPIC
GLUCOSE, UA: NEGATIVE mg/dL
Hgb urine dipstick: NEGATIVE
KETONES UR: NEGATIVE mg/dL
NITRITE: NEGATIVE
PH: 6 (ref 5.0–8.0)
PROTEIN: NEGATIVE mg/dL
Specific Gravity, Urine: 1.03 (ref 1.005–1.030)

## 2015-06-01 LAB — COMPREHENSIVE METABOLIC PANEL
ALBUMIN: 3.3 g/dL — AB (ref 3.5–5.0)
ALK PHOS: 67 U/L (ref 38–126)
ALT: 47 U/L (ref 14–54)
AST: 31 U/L (ref 15–41)
Anion gap: 8 (ref 5–15)
BILIRUBIN TOTAL: 0.3 mg/dL (ref 0.3–1.2)
BUN: 18 mg/dL (ref 6–20)
CO2: 28 mmol/L (ref 22–32)
CREATININE: 0.74 mg/dL (ref 0.44–1.00)
Calcium: 8.2 mg/dL — ABNORMAL LOW (ref 8.9–10.3)
Chloride: 104 mmol/L (ref 101–111)
GFR calc Af Amer: >60 mL/min (ref >60)
GLUCOSE: 98 mg/dL (ref 65–99)
Potassium: 4.2 mmol/L (ref 3.5–5.1)
Sodium: 140 mmol/L (ref 135–145)
TOTAL PROTEIN: 6.8 g/dL (ref 6.5–8.1)

## 2015-06-01 LAB — POC URINE PREG, ED: Preg Test, Ur: NEGATIVE

## 2015-06-01 LAB — URINE MICROSCOPIC-ADD ON

## 2015-06-01 LAB — LIPASE, BLOOD: Lipase: 24 U/L (ref 11–51)

## 2015-06-01 MED ORDER — OMEPRAZOLE 20 MG PO CPDR: 20.0000 mg | DELAYED_RELEASE_CAPSULE | Freq: Every day | ORAL | Status: DC

## 2015-06-01 MED ORDER — FENTANYL CITRATE (PF) 100 MCG/2ML IJ SOLN
100.0000 ug | Freq: Once | INTRAMUSCULAR | Status: AC
  Administered 2015-06-01: 100 ug via INTRAVENOUS
  Filled 2015-06-01: qty 2

## 2015-06-01 MED ORDER — SODIUM CHLORIDE 0.9 % IV BOLUS (SEPSIS)
1500.0000 mL | Freq: Once | INTRAVENOUS | Status: AC
  Administered 2015-06-01: 1500 mL via INTRAVENOUS

## 2015-06-01 MED ORDER — FENTANYL CITRATE (PF) 100 MCG/2ML IJ SOLN
100.0000 ug | Freq: Once | INTRAMUSCULAR | Status: AC
Start: 2015-06-01 — End: 2015-06-01
  Administered 2015-06-01: 100 ug via INTRAVENOUS
  Filled 2015-06-01: qty 2

## 2015-06-01 MED ORDER — OXYCODONE-ACETAMINOPHEN 5-325 MG PO TABS
1.0000 | ORAL_TABLET | Freq: Once | ORAL | Status: AC
  Administered 2015-06-01: 1 via ORAL
  Filled 2015-06-01: qty 1

## 2015-06-01 MED ORDER — ONDANSETRON HCL 4 MG/2ML IJ SOLN
4.0000 mg | Freq: Once | INTRAMUSCULAR | Status: AC
  Administered 2015-06-01: 4 mg via INTRAVENOUS
  Filled 2015-06-01: qty 2

## 2015-06-01 MED ORDER — ONDANSETRON 8 MG PO TBDP: 8.0000 mg | ORAL_TABLET | Freq: Three times a day (TID) | ORAL | Status: DC | PRN

## 2015-06-01 NOTE — ED Notes (Addendum)
Pt reports right sided abd pain that radiates to back x4 days, fevers at home. Vomited x2 yesterday. Reports blood in urine, but unsure if this was related to menstrual cycle. Pain 10/10.

## 2015-06-01 NOTE — ED Notes (Addendum)
Patient requesting pain medication, Dr. Patria Maneampos made aware of same. Verbal orders for one 5mg  Percocet. Patient lists an allergy to Percocet. Dr. Patria Maneampos made aware of same. No new orders at this time.

## 2015-06-01 NOTE — ED Provider Notes (Signed)
CSN: [id number]     Arrival date & time 06/01/15  1248 History   First MD Initiated Contact with Patient 06/01/15 1523     Chief Complaint  Patient presents with  . Abdominal Pain     HPI Patient presents to the emergency department complaining of right-sided and right upper quadrant abdominal pain with some radiation to the right upper back over the past 4 days.  No prior history of gallstones.  She does report a history of kidney stones or reports this feels similar than her typical kidney stone pain.  She reports possible blood in her urine.  She denies vaginal complaints.  No pain with intercourse.  Denies diarrhea.  Reports nausea and vomiting.  Past Medical History  Diagnosis Date  . Depression   . Bipolar 1 disorder, depressed (HCC)   . PTSD (post-traumatic stress disorder)   . Kidney stone   . Migraine   . Urinary tract infection   . Pregnancy induced hypertension   . Obesity   . UTI (lower urinary tract infection)   . Diabetes mellitus without complication (HCC)   . Hep C w/o coma, chronic (HCC)    Past Surgical History  Procedure Laterality Date  . Kidney stones    . Lipotripsy     Family History  Problem Relation Age of Onset  . Rheum arthritis Mother   . Asthma Mother   . Hypertension Mother   . Other Neg Hx   . CAD Other    Social History  Substance Use Topics  . Smoking status: Current Every Day Smoker -- 1.00 packs/day    Types: Cigarettes  . Smokeless tobacco: Never Used  . Alcohol Use: No     Comment: denied any alcohol use   OB History    Gravida Para Term Preterm AB TAB SAB Ectopic Multiple Living   Review of Systems  All other systems reviewed and are negative.     Allergies  Acetaminophen; Darvocet; Morphine and related; Sulfa antibiotics; Tomato; and Pioglitazone  Home Medications   Prior to Admission medications   Medication Sig Start Date End Date Taking? Authorizing Provider  atenolol (TENORMIN) 25 MG tablet  Take 1 tablet (25 mg total) by mouth daily. 12/11/14  Yes Beau Fanny, FNP  DULoxetine (CYMBALTA) 20 MG capsule Take 20 mg by mouth daily.   Yes Historical Provider, MD  cephALEXin (KEFLEX) 500 MG capsule Take 1 capsule (500 mg total) by mouth 2 (two) times daily. Patient not taking: Reported on 06/01/2015 04/25/15   Cheri Fowler, PA-C  doxycycline (VIBRAMYCIN) 100 MG capsule Take 1 capsule (100 mg total) by mouth 2 (two) times daily. Patient not taking: Reported on 06/01/2015 04/25/15   Cheri Fowler, PA-C  glimepiride (AMARYL) 2 MG tablet Take 1 tablet (2 mg total) by mouth daily with breakfast. Patient not taking: Reported on 04/25/2015 12/11/14   Beau Fanny, FNP  ibuprofen (ADVIL,MOTRIN) 800 MG tablet Take 1 tablet (800 mg total) by mouth 3 (three) times daily. Patient not taking: Reported on 04/25/2015 02/09/15   [name], PA-C  lurasidone 20 MG TABS Take 1 tablet (20 mg total) by mouth daily with breakfast. Patient not taking: Reported on 12/21/2014 12/11/14   Beau Fanny, FNP  metroNIDAZOLE (FLAGYL) 500 MG tablet Take 1 tablet (500 mg total) by mouth 2 (two) times daily. Patient not taking: Reported on 06/01/2015 04/25/15   Cheri Fowler, PA-C  ondansetron (ZOFRAN ODT) 8 MG disintegrating tablet Take 1 tablet (8 mg total) by mouth every 8 (eight) hours as needed for nausea or vomiting. Patient not taking: Reported on 04/25/2015 02/09/15   [name], PA-C  oxyCODONE-acetaminophen (PERCOCET/ROXICET) 5-325 MG tablet Take 1-2 tablets by mouth every 6 (six) hours as needed for severe pain. Patient not taking: Reported on 06/01/2015 04/25/15   Cheri Fowler, PA-C  penicillin v potassium (VEETID) 500 MG tablet Take 1 tablet (500 mg total) by mouth 3 (three) times daily. Patient not taking: Reported on 04/25/2015 02/09/15   [name], PA-C  predniSONE (DELTASONE) 50 MG tablet 1 tablet for 6 days, one half tablet for 6 days Patient not taking: Reported on 02/04/2015 02/02/15   Donnetta Hutching, MD   promethazine (PHENERGAN) 25 MG tablet Take 1 tablet (25 mg total) by mouth every 6 (six) hours as needed for nausea or vomiting. Patient not taking: Reported on 06/01/2015 02/05/15   Antony Madura, PA-C   BP 136/70 mmHg  Pulse 93  Temp(Src) 98.2 F (36.8 C) (Oral)  Resp 16  SpO2 100%  LMP 05/26/2015 (Exact Date) Physical Exam  Constitutional: She is oriented to person, place, and time. She appears well-developed and well-nourished. No distress.  HENT:  Head: Normocephalic and atraumatic.  Eyes: EOM are normal.  Neck: Normal range of motion.  Cardiovascular: Normal rate, regular rhythm and normal heart sounds.   Pulmonary/Chest: Effort normal and breath sounds normal.  Abdominal: Soft. She exhibits no distension.  Mild right upper quadrant abdominal tenderness without guarding or rebound.  Musculoskeletal: Normal range of motion.  Neurological: She is alert and oriented to person, place, and time.  Skin: Skin is warm and dry.  Psychiatric: She has a normal mood and affect. Judgment normal.  Nursing note and vitals reviewed.   ED Course  Procedures (including critical care time) Labs Review Labs Reviewed  COMPREHENSIVE METABOLIC PANEL - Abnormal; Notable for the following:    Calcium 8.2 (*)    Albumin 3.3 (*)    All other components within normal limits  URINALYSIS, ROUTINE W REFLEX MICROSCOPIC (NOT AT [facility]) - Abnormal; Notable for the following:    APPearance CLOUDY (*)    Bilirubin Urine SMALL (*)    Leukocytes, UA MODERATE (*)    All other components within normal limits  URINE MICROSCOPIC-ADD ON - Abnormal; Notable for the following:    Squamous Epithelial / LPF TOO NUMEROUS TO COUNT (*)    Bacteria, UA MANY (*)    All other components within normal limits  URINE CULTURE  LIPASE, BLOOD  CBC  POC URINE PREG, ED    Imaging Review US Abdomen Complete  06/01/2015  CLINICAL DATA:  Acute right upper quadrant abdominal pain radiating to the back for 2 days. Vomiting.  EXAM: ABDOMEN ULTRASOUND COMPLETE COMPARISON:  08/03/2014, 07/29/2014 FINDINGS: Gallbladder: No gallstones or wall thickening visualized. No sonographic Murphy sign noted by sonographer. Common bile duct: Diameter: 5.4 mm Liver: Mild increased echogenicity. No focal hepatic abnormality or biliary dilatation. IVC: No abnormality visualized. Pancreas: Visualized portion unremarkable. Spleen: Size and appearance within normal limits. Right Kidney: Length: 13.8 cm. Echogenicity within normal limits. No mass or hydronephrosis visualized. Left Kidney: Length: 14.1 cm. Echogenicity within normal limits. No mass or hydronephrosis visualized. Abdominal aorta: No aneurysm visualized. Other findings: Limited exam because of body habitus and bowel gas. IMPRESSION: No acute finding by abdominal ultrasound. Electronically Signed   By: Judie Petit.  Shick M.D.   On: 06/01/2015 17:48  I have personally reviewed and evaluated these images and lab results as part of my medical decision-making.   EKG Interpretation None      MDM   Final diagnoses:  None    7:37 PM Patient's discomfort and pain is improving at this time.  No gallstones noted.  No right-sided hydro-noted.  Patient has a long-standing history of recurrent abdominal pain after review the medical record.  She'll need to be referred to GI for ongoing workup of her recurrent/chronic abdominal pain.  She's had multiple CT scans without pathology as well.  She's had 13 visits to the ER in the past 6 months.   Azalia BilisKevin Adalis Gatti, MD 06/01/15 [phone]

## 2015-06-01 NOTE — ED Notes (Signed)
Pt still c/o 10/10 pain; MD notified; no orders at this time.

## 2015-06-01 NOTE — ED Notes (Addendum)
Pt pushed call button to state to nurse "Well if you're not gonna do anything else can you just take this (the IV) out?"

## 2015-06-02 ENCOUNTER — Encounter: Payer: Self-pay | Admitting: Internal Medicine

## 2015-06-03 LAB — URINE CULTURE

## 2015-06-16 ENCOUNTER — Emergency Department (HOSPITAL_COMMUNITY): Payer: Medicaid Other

## 2015-06-16 ENCOUNTER — Emergency Department (HOSPITAL_COMMUNITY)
Admission: EM | Admit: 2015-06-16 | Discharge: 2015-06-17 | Disposition: A | Discharge status: Home / Self Care | Payer: Medicaid Other | Attending: Emergency Medicine | Admitting: Emergency Medicine

## 2015-06-16 ENCOUNTER — Encounter (HOSPITAL_COMMUNITY): Payer: Self-pay | Admitting: Emergency Medicine

## 2015-06-16 DIAGNOSIS — F17210 Nicotine dependence, cigarettes, uncomplicated: Secondary | ICD-10-CM | POA: Diagnosis not present

## 2015-06-16 DIAGNOSIS — F3130 Bipolar disorder, current episode depressed, mild or moderate severity, unspecified: Secondary | ICD-10-CM | POA: Diagnosis not present

## 2015-06-16 DIAGNOSIS — Z3202 Encounter for pregnancy test, result negative: Secondary | ICD-10-CM | POA: Insufficient documentation

## 2015-06-16 DIAGNOSIS — F1424 Cocaine dependence with cocaine-induced mood disorder: Secondary | ICD-10-CM | POA: Insufficient documentation

## 2015-06-16 DIAGNOSIS — Z8619 Personal history of other infectious and parasitic diseases: Secondary | ICD-10-CM | POA: Insufficient documentation

## 2015-06-16 DIAGNOSIS — F1494 Cocaine use, unspecified with cocaine-induced mood disorder: Secondary | ICD-10-CM | POA: Diagnosis present

## 2015-06-16 DIAGNOSIS — Z79899 Other long term (current) drug therapy: Secondary | ICD-10-CM | POA: Diagnosis not present

## 2015-06-16 DIAGNOSIS — R0602 Shortness of breath: Secondary | ICD-10-CM | POA: Diagnosis present

## 2015-06-16 DIAGNOSIS — Z87442 Personal history of urinary calculi: Secondary | ICD-10-CM | POA: Diagnosis not present

## 2015-06-16 DIAGNOSIS — E669 Obesity, unspecified: Secondary | ICD-10-CM | POA: Diagnosis not present

## 2015-06-16 DIAGNOSIS — E119 Type 2 diabetes mellitus without complications: Secondary | ICD-10-CM | POA: Diagnosis not present

## 2015-06-16 DIAGNOSIS — F1310 Sedative, hypnotic or anxiolytic abuse, uncomplicated: Secondary | ICD-10-CM | POA: Diagnosis not present

## 2015-06-16 DIAGNOSIS — Z87440 Personal history of urinary (tract) infections: Secondary | ICD-10-CM | POA: Diagnosis not present

## 2015-06-16 DIAGNOSIS — F32A Depression, unspecified: Secondary | ICD-10-CM

## 2015-06-16 DIAGNOSIS — B349 Viral infection, unspecified: Secondary | ICD-10-CM

## 2015-06-16 DIAGNOSIS — F329 Major depressive disorder, single episode, unspecified: Secondary | ICD-10-CM

## 2015-06-16 DIAGNOSIS — F1420 Cocaine dependence, uncomplicated: Secondary | ICD-10-CM

## 2015-06-16 DIAGNOSIS — G43909 Migraine, unspecified, not intractable, without status migrainosus: Secondary | ICD-10-CM | POA: Insufficient documentation

## 2015-06-16 LAB — CBC WITH DIFFERENTIAL/PLATELET
BASOS ABS: 0 10*3/uL (ref 0.0–0.1)
Basophils Relative: 0 %
EOS ABS: 0.1 10*3/uL (ref 0.0–0.7)
EOS PCT: 1 %
HCT: 39 % (ref 36.0–46.0)
HEMOGLOBIN: 12.3 g/dL (ref 12.0–15.0)
LYMPHS ABS: 2.3 10*3/uL (ref 0.7–4.0)
Lymphocytes Relative: 22 %
MCH: 26.2 pg (ref 26.0–34.0)
MCHC: 31.5 g/dL (ref 30.0–36.0)
MCV: 83.2 fL (ref 78.0–100.0)
Monocytes Absolute: 0.7 10*3/uL (ref 0.1–1.0)
Monocytes Relative: 6 %
NEUTROS PCT: 71 %
Neutro Abs: 7.3 10*3/uL (ref 1.7–7.7)
Platelets: 282 10*3/uL (ref 150–400)
RBC: 4.69 MIL/uL (ref 3.87–5.11)
RDW: 14.9 % (ref 11.5–15.5)
WBC: 10.3 10*3/uL (ref 4.0–10.5)

## 2015-06-16 LAB — URINALYSIS, ROUTINE W REFLEX MICROSCOPIC
GLUCOSE, UA: NEGATIVE mg/dL
KETONES UR: 15 mg/dL — AB
Nitrite: POSITIVE — AB
PH: 5.5 (ref 5.0–8.0)
PROTEIN: 100 mg/dL — AB
Specific Gravity, Urine: 1.031 — ABNORMAL HIGH (ref 1.005–1.030)

## 2015-06-16 LAB — URINE MICROSCOPIC-ADD ON

## 2015-06-16 LAB — I-STAT BETA HCG BLOOD, ED (MC, WL, AP ONLY): I-stat hCG, quantitative: <5 m[IU]/mL (ref <5)

## 2015-06-16 LAB — BASIC METABOLIC PANEL
Anion gap: 11 (ref 5–15)
BUN: 7 mg/dL (ref 6–20)
CO2: 23 mmol/L (ref 22–32)
CREATININE: 0.94 mg/dL (ref 0.44–1.00)
Calcium: 9.6 mg/dL (ref 8.9–10.3)
Chloride: 107 mmol/L (ref 101–111)
GFR calc Af Amer: >60 mL/min (ref >60)
GLUCOSE: 126 mg/dL — AB (ref 65–99)
Potassium: 3.6 mmol/L (ref 3.5–5.1)
SODIUM: 141 mmol/L (ref 135–145)

## 2015-06-16 LAB — RAPID URINE DRUG SCREEN, HOSP PERFORMED
Amphetamines: NOT DETECTED
BENZODIAZEPINES: POSITIVE — AB
Barbiturates: NOT DETECTED
COCAINE: POSITIVE — AB
OPIATES: NOT DETECTED
Tetrahydrocannabinol: NOT DETECTED

## 2015-06-16 LAB — ETHANOL: Alcohol, Ethyl (B): <5 mg/dL (ref <5)

## 2015-06-16 MED ORDER — DULOXETINE HCL 20 MG PO CPEP
20.0000 mg | ORAL_CAPSULE | Freq: Every day | ORAL | Status: DC
  Administered 2015-06-16 – 2015-06-17 (×2): 20 mg via ORAL
  Filled 2015-06-16 (×2): qty 1

## 2015-06-16 MED ORDER — ATENOLOL 25 MG PO TABS
25.0000 mg | ORAL_TABLET | Freq: Every day | ORAL | Status: DC
  Administered 2015-06-16 – 2015-06-17 (×2): 25 mg via ORAL
  Filled 2015-06-16 (×2): qty 1

## 2015-06-16 MED ORDER — ALUM & MAG HYDROXIDE-SIMETH 200-200-20 MG/5ML PO SUSP: 30.0000 mL | ORAL | Status: DC | PRN

## 2015-06-16 MED ORDER — SODIUM CHLORIDE 0.9 % IV SOLN
INTRAVENOUS | Status: DC
Start: 2015-06-16 — End: 2015-06-17

## 2015-06-16 MED ORDER — LORAZEPAM 1 MG PO TABS
1.0000 mg | ORAL_TABLET | Freq: Three times a day (TID) | ORAL | Status: DC | PRN
  Administered 2015-06-16: 1 mg via ORAL
  Filled 2015-06-16: qty 1

## 2015-06-16 MED ORDER — NICOTINE 21 MG/24HR TD PT24
21.0000 mg | MEDICATED_PATCH | Freq: Every day | TRANSDERMAL | Status: DC
  Administered 2015-06-16: 21 mg via TRANSDERMAL
  Filled 2015-06-16: qty 1

## 2015-06-16 MED ORDER — ONDANSETRON HCL 4 MG/2ML IJ SOLN
4.0000 mg | Freq: Once | INTRAMUSCULAR | Status: AC
  Administered 2015-06-16: 4 mg via INTRAVENOUS
  Filled 2015-06-16: qty 2

## 2015-06-16 MED ORDER — KETOROLAC TROMETHAMINE 30 MG/ML IJ SOLN
30.0000 mg | Freq: Once | INTRAMUSCULAR | Status: AC
  Administered 2015-06-16: 30 mg via INTRAVENOUS
  Filled 2015-06-16: qty 1

## 2015-06-16 MED ORDER — SODIUM CHLORIDE 0.9 % IV BOLUS (SEPSIS)
2000.0000 mL | Freq: Once | INTRAVENOUS | Status: AC
  Administered 2015-06-16: 2000 mL via INTRAVENOUS

## 2015-06-16 MED ORDER — ONDANSETRON HCL 4 MG PO TABS: 4.0000 mg | ORAL_TABLET | Freq: Three times a day (TID) | ORAL | Status: DC | PRN
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
## 2015-06-16 NOTE — Discharge Instructions (Signed)
Major Depressive Disorder °Major depressive disorder is a mental illness. It also may be called clinical depression or unipolar depression. Major depressive disorder usually causes feelings of sadness, hopelessness, or helplessness. Some people with this disorder do not feel particularly sad but lose interest in doing things they used to enjoy (anhedonia). Major depressive disorder also can cause physical symptoms. It can interfere with work, school, relationships, and other normal everyday activities. The disorder varies in severity but is longer lasting and more serious than the sadness we all feel from time to time in our lives. °Major depressive disorder often is triggered by stressful life events or major life changes. Examples of these triggers include divorce, loss of your job or home, a move, and the death of a family member or close friend. Sometimes this disorder occurs for no obvious reason at all. People who have family members with major depressive disorder or bipolar disorder are at higher risk for developing this disorder, with or without life stressors. Major depressive disorder can occur at any age. It may occur just once in your life (single episode major depressive disorder). It may occur multiple times (recurrent major depressive disorder). °SYMPTOMS °People with major depressive disorder have either anhedonia or depressed mood on nearly a daily basis for at least 2 weeks or longer. Symptoms of depressed mood include: °· Feelings of sadness (blue or down in the dumps) or emptiness. °· Feelings of hopelessness or helplessness. °· Tearfulness or episodes of crying (may be observed by others). °· Irritability (children and adolescents). °In addition to depressed mood or anhedonia or both, people with this disorder have at least four of the following symptoms: °· Difficulty sleeping or sleeping too much.   °· Significant change (increase or decrease) in appetite or weight.   °· Lack of energy or  motivation. °· Feelings of guilt and worthlessness.   °· Difficulty concentrating, remembering, or making decisions. °· Unusually slow movement (psychomotor retardation) or restlessness (as observed by others).   °· Recurrent wishes for death, recurrent thoughts of self-harm (suicide), or a suicide attempt. °People with major depressive disorder commonly have persistent negative thoughts about themselves, other people, and the world. People with severe major depressive disorder may experience distorted beliefs or perceptions about the world (psychotic delusions). They also may see or hear things that are not real (psychotic hallucinations). °DIAGNOSIS °Major depressive disorder is diagnosed through an assessment by your health care provider. Your health care provider will ask about aspects of your daily life, such as mood, sleep, and appetite, to see if you have the diagnostic symptoms of major depressive disorder. Your health care provider may ask about your medical history and use of alcohol or drugs, including prescription medicines. Your health care provider also may do a physical exam and blood work. This is because certain medical conditions and the use of certain substances can cause major depressive disorder-like symptoms (secondary depression). Your health care provider also may refer you to a mental health specialist for further evaluation and treatment. °TREATMENT °It is important to recognize the symptoms of major depressive disorder and seek treatment. The following treatments can be prescribed for this disorder:   °· Medicine. Antidepressant medicines usually are prescribed. Antidepressant medicines are thought to correct chemical imbalances in the brain that are commonly associated with major depressive disorder. Other types of medicine may be added if the symptoms do not respond to antidepressant medicines alone or if psychotic delusions or hallucinations occur. °· Talk therapy. Talk therapy can be  helpful in treating major depressive disorder by providing   support, education, and guidance. Certain types of talk therapy also can help with negative thinking (cognitive behavioral therapy) and with relationship issues that trigger this disorder (interpersonal therapy). A mental health specialist can help determine which treatment is best for you. Most people with major depressive disorder do well with a combination of medicine and talk therapy. Treatments involving electrical stimulation of the brain can be used in situations with extremely severe symptoms or when medicine and talk therapy do not work over time. These treatments include electroconvulsive therapy, transcranial magnetic stimulation, and vagal nerve stimulation.   This information is not intended to replace advice given to you by your health care provider. Make sure you discuss any questions you have with your health care provider.   Document Released: 09/22/2012 Document Revised: 06/18/2014 Document Reviewed: 09/22/2012 Elsevier Interactive Patient Education 2016 [employer] Substance Abuse Treatment Programs  Intensive Outpatient Programs [facility]     [address]      [place], [place]                   [phone]       [facility] [address] [place], [place] [phone]  [facility]     (Inpatient and outpatient)     [address]           [phone]    [facility] [phone] (Suboxone and Methadone)  [address]      [place], [place] [address]      [phone]       [address] [place], [place] [phone]  [facility] (Outpatient/Inpatient, Chemical)    (insurance only) [phone]             [facility] (Groups & Residential) [place], [place] [phone]     [facility]     [address]     [place], [place]      [phone]       [facility]  (for caregivers and family) [phone] [place], [place] [phone]      Residential Treatment Programs [facility]      [address], [place], [place] [address]  [phone]       [facility] [address] [place], [place] [address] [phone]  [facility]        [phone]       [facility] [phone]  [facility] ([facility]             [address]                                         [place], [place]                                                [phone] or [phone]                               [facility] [place] [address] [place], [address] [phone]  [facility]    [address]      [place], [place]     [phone]  [facility] [address] [place], [place] [phone]  [facility]   [address]     [place], [place] [address]     [phone]      Admissions: 8am-3pm M-F  [facility] ([facility]) [address] [place], [place] [phone]  [facility] Program: Residential Program [phone](90 Days)   [place], [place]      [phone] or [phone]     [facility]: [facility] [place], [place] (Walk in Hours over the weekend or by referral)  [facility] [address], [place], [place] [address] [phone]  Crisis Mobile: [facility]:  [phone] (for crisis response 24 hours a day) [facility] Hotline:      [phone] Outpatient Psychiatry and Counseling  [facility]: Mobile Crisis Management 24 hours:  [phone]  [facility] sliding scale fee and walk in schedule: M-F 8am-12pm/1pm-3pm [address]  [place], [place] [address] [phone]  [facility] [address] [place], [place] [address] [phone]  [facility] ([facility])- new patient  walk-in appointments available Monday - Friday 8am -3pm.          [address] [place], [place] [address] [phone] or crisis line- [phone]  [facility]/ Intensive Outpatient Therapy Program [address] [place], [place] [address] [phone]  [facility]                  Crisis Services      [phone]      [address]     [place], [place] [address]                 [facility] [place] [facility]   [facility] [phone] [address] [place], [place] [address]   [facility]          [address] Laura,  [place], [place] [address]       [phone]  [facility] [address] [place], [place] [address] [phone]  [facility]    [address]    [place], [place] [address]     [phone]       Andee PolesParish McKinney, MD     [address]     [place] [address]     [phone]       [facility] [address] [place] [address]  [facility]     [address]     [place], [place]      [phone]       [facility] Eulogio DitchShamsher Ahluwalia, MD [address] [place], [place] [phone]  [facility]     [address]     [place], [place] [address]     [phone]       [facility] [address] [place], [place] [address] [phone] Resources for Temporary Residential Assistance/Crisis Centers  [facility]) M-F 8am-3pm   [address] [place], [place] [address]   [phone] Services include: laundry, barbering, support groups, case management, phone  & computer access, showers, AA/NA mtgs, mental health/substance abuse nurse, job skills class, disability information, VA assistance, spiritual classes, etc.   HOMELESS SHELTERS  [facility] [facility]     [facility]  [address], [place]     [phone]              [facility] (women and children)       [address] [place], [place] [address] [phone] [email or web address] for application and process Application Required  [facility]   [address]    [place] [address]     [phone]                    [facility] [address] [place], [place] [address] [phone](schedule application appt.) Application Required  [facility] (women only)    [address]     [place], [place] [address]     [phone]      Intake starts 6pm daily Need valid ID, SSC, & Police report [facility] [place] [address] [place], [place] [phone] Application Required  [facility] (men only)     [address]      [place], [place]     [phone]       [facility] (Pregnant women only) [address] [place], [place] [phone]  [facility]      [address]      [place], [place] [address]     [phone]             [facility] [address] [place], [place] [phone] 90 day commitment/SA/Application process  [facility](men only)     [address]     [place], [place]     [phone]       Check-in at [facility]            [facility]is [facility] [address] [place], [place] [address] [phone] Men/Women/Women and Children must be there by 7 pm  [facility] [place], [place] [phone]                Viral Infections A virus is a type of germ. Viruses can cause:  Minor sore throats.  Aches and pains.  Headaches.  Runny nose.  Rashes.  Watery eyes.  Tiredness.  Coughs.  Loss of appetite.  Feeling sick to your stomach (nausea).  Throwing up (vomiting).  Watery poop (diarrhea). HOME CARE   Only take medicines as  told by your doctor.  Drink enough water and fluids to keep your pee (urine) clear or pale yellow. Sports drinks are a good choice.  Get plenty of rest and eat healthy. Soups and broths with crackers or rice are fine. GET HELP RIGHT AWAY IF:   You have a very bad headache.  You have shortness of breath.  You have chest pain or neck pain.  You have an unusual rash.  You cannot stop throwing up.  You have watery poop that does not stop.  You cannot keep fluids down.  You or your child has a temperature by mouth above 102 F (38.9 C), not controlled by medicine.  Your baby is older than 3 months with a rectal temperature of 102 F (38.9 C) or higher.  Your baby is 243 months old or younger with a rectal temperature of 100.4 F (38 C) or higher. MAKE SURE YOU:   Understand these instructions.  Will watch this condition.  Will get help right away if you are not doing well or get worse.  This information is not intended to replace advice given to you by your health care provider. Make sure you discuss any questions you have with your health care provider.   Document Released: 05/10/2008 Document Revised: 08/20/2011 Document Reviewed: 11/03/2014 Elsevier Interactive Patient Education [employer]2016 Elsevier Inc.

## 2015-06-16 NOTE — ED Notes (Signed)
Patient informed that a urine specimen was needed. 

## 2015-06-16 NOTE — ED Notes (Signed)
Patient called Clinical research associatewriter over and stated, "I am suicidal I need Behavioral Health." Writer asked about plan. Patient stated, "I have pills I can Overdose on them. I have plenty of pills." EDP notified.
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
## 2015-06-16 NOTE — ED Notes (Signed)
Patient changed into burgundy scrubs and wanded by security. 
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
## 2015-06-17 ENCOUNTER — Emergency Department (HOSPITAL_COMMUNITY): Payer: Medicaid Other

## 2015-06-17 DIAGNOSIS — F1494 Cocaine use, unspecified with cocaine-induced mood disorder: Secondary | ICD-10-CM | POA: Diagnosis not present

## 2015-06-17 MED ORDER — IBUPROFEN 200 MG PO TABS
600.0000 mg | ORAL_TABLET | Freq: Once | ORAL | Status: AC
Start: 2015-06-17 — End: 2015-06-17
  Administered 2015-06-17: 600 mg via ORAL
  Filled 2015-06-17: qty 3

## 2015-06-17 MED ORDER — CEPHALEXIN 500 MG PO CAPS: 500.0000 mg | ORAL_CAPSULE | Freq: Three times a day (TID) | ORAL | Status: DC

## 2015-06-17 MED ORDER — DULOXETINE HCL 20 MG PO CPEP: 20.0000 mg | ORAL_CAPSULE | Freq: Every day | ORAL | Status: DC

## 2015-06-17 NOTE — ED Notes (Signed)
Pt d/c from the hospital with a family member. All items returned. Prescriptions given and d/c instructions.

## 2015-06-17 NOTE — ED Notes (Signed)
Pt being escorted for CT scan by staff member.

## 2015-06-17 NOTE — ED Notes (Signed)
Pt reports back, neck pain and a sore throat. MD on the unit and scheduled to see pt this morning. Pt is eating breakfast and reports wanting resources for a counselor. She denies si and hi. Safety maintained on the unit.

## 2015-06-17 NOTE — [facility] Suicide Risk Assessment (Signed)
Suicide Risk Assessment  Dis[facility]   [facility]   Demographic Factors:  Caucasian  Total Time spent with patient: 45 minutes  Musculoskeletal: Strength & Muscle Tone: within normal limits Gait & Station: normal Patient leans: N/A  Psychiatric Specialty Exam: Review of Systems  Constitutional: Negative.   HENT: Negative.   Eyes: Negative.   Respiratory: Negative.   Cardiovascular: Negative.   Gastrointestinal: Negative.   Genitourinary: Negative.   Musculoskeletal: Negative.   Skin: Negative.   Neurological: Negative.   Endo/Heme/Allergies: Negative.   Psychiatric/Behavioral: Positive for substance abuse.    Blood pressure 147/71, pulse 68, temperature 98.1 F (36.7 C), temperature source Oral, resp. rate 17, last menstrual period 05/26/2015, SpO2 99 %.There is no weight on file to calculate BMI.  General Appearance: Disheveled  Eye Contact::  Good  Speech:  Normal Rate  Volume:  Normal  Mood:  Anxious, mild  Affect:  Congruent  Thought Process:  Coherent  Orientation:  Full (Time, Place, and Person)  Thought Content:  WDL  Suicidal Thoughts:  No  Homicidal Thoughts:  No  Memory:  Immediate;   Good Recent;   Good Remote;   Good  Judgement:  Fair  Insight:  Fair  Psychomotor Activity:  Normal  Concentration:  Good  Recall:  Good  Fund of Knowledge:Good  Language: Good  Akathisia:  No  Handed:  Right  AIMS (if indicated):     Assets:  Housing Leisure Time Physical Health Resilience Social Support  ADL's:  Intact  Cognition: WNL  Sleep:         Has this patient used any form of tobacco in the last 30 days? (Cigarettes, Smokeless Tobacco, Cigars, and/or Pipes) Yes, A prescription for an FDA-approved tobacco cessation medication was offered at discharge and the patient refused  Mental Status Per Nursing Assessment::   On Admission:   Homicidal ideations  Current Mental Status by Physician: NA  Loss  Factors: NA  Historical Factors: NA  Risk Reduction Factors:   Sense of responsibility to family, Living with another person, especially a relative and Positive social support  Continued Clinical Symptoms:  Anxiety, mild  Cognitive Features That Contribute To Risk:  None    Suicide Risk:  Minimal: No identifiable suicidal ideation.  Patients presenting with no risk factors but with morbid ruminations; may be classified as minimal risk based on the severity of the depressive symptoms  Principal Problem: Cocaine-induced mood disorder [facility](HCC) Discharge Diagnoses:  Patient Active Problem List   Diagnosis Date Noted  . Cocaine-induced mood disorder (HCC) [F14.94] 06/17/2015    Priority: High  . Cocaine dependence (HCC) [F14.20] 12/10/2014    Priority: High  . Polysubstance dependence (HCC) [F19.20] 12/10/2014    Priority: High  . Suicidal ideations [R45.851] 12/10/2014    Priority: High  . PTSD (post-traumatic stress disorder) [F43.10] 12/10/2014  . Anemia [D64.9] 03/08/2012  . Metrorrhagia [N92.1] 03/06/2012  . Dysmenorrhea [N94.6] 03/06/2012      Plan Of Care/Follow-up recommendations:  Activity:  as tolerated Diet:  heart healthy diet  Is patient on multiple antipsychotic therapies at discharge:  No   Has Patient had three or more failed trials of antipsychotic monotherapy by history:  No  Recommended Plan for Multiple Antipsychotic Therapies: NA    Kamorah Nevils, PMH-NP 06/17/2015, 1:18 PM

## 2015-06-17 NOTE — ED Provider Notes (Signed)
Upon review of labs, urinalysis is concerning for a UTI with leukocytes and nitrites. When talking to patient, she is concerned about recurrent kidney stone siting flank pain, chills, and urinary frequency for about one week. Vital signs are unremarkable here. She is well-appearing and currently awaiting psychiatric disposition as she was threatening suicide with an overdose of pills. Will send her urine for culture, and get CT scan. If negative, treat as a UTI/early pyelonephritis.  Patient CT scan is unremarkable. This is likely a symptomatic UTI. We'll send urine for culture, treat with Keflex. Disposition per psychiatry team.  Results for orders placed or performed during the hospital encounter of 06/16/15  CBC with Differential/Platelet  Result Value Ref Range   WBC 10.3 4.0 - 10.5 K/uL   RBC 4.69 3.87 - 5.11 MIL/uL   Hemoglobin 12.3 12.0 - 15.0 g/dL   HCT 69.6 29.5 - 28.4 %   MCV 83.2 78.0 - 100.0 fL   MCH 26.2 26.0 - 34.0 pg   MCHC 31.5 30.0 - 36.0 g/dL   RDW 13.2 44.0 - 10.2 %   Platelets 282 150 - 400 K/uL   Neutrophils Relative % 71 %   Neutro Abs 7.3 1.7 - 7.7 K/uL   Lymphocytes Relative 22 %   Lymphs Abs 2.3 0.7 - 4.0 K/uL   Monocytes Relative 6 %   Monocytes Absolute 0.7 0.1 - 1.0 K/uL   Eosinophils Relative 1 %   Eosinophils Absolute 0.1 0.0 - 0.7 K/uL   Basophils Relative 0 %   Basophils Absolute 0.0 0.0 - 0.1 K/uL  Urinalysis, Routine w reflex microscopic (not at [facility])  Result Value Ref Range   Color, Urine RED (A) YELLOW   APPearance TURBID (A) CLEAR   Specific Gravity, Urine 1.031 (H) 1.005 - 1.030   pH 5.5 5.0 - 8.0   Glucose, UA NEGATIVE NEGATIVE mg/dL   Hgb urine dipstick LARGE (A) NEGATIVE   Bilirubin Urine MODERATE (A) NEGATIVE   Ketones, ur 15 (A) NEGATIVE mg/dL   Protein, ur 725 (A) NEGATIVE mg/dL   Nitrite POSITIVE (A) NEGATIVE   Leukocytes, UA MODERATE (A) NEGATIVE  Basic metabolic panel  Result Value Ref Range   Sodium 141 135 - 145 mmol/L    Potassium 3.6 3.5 - 5.1 mmol/L   Chloride 107 101 - 111 mmol/L   CO2 23 22 - 32 mmol/L   Glucose, Bld 126 (H) 65 - 99 mg/dL   BUN 7 6 - 20 mg/dL   Creatinine, Ser 3.66 0.44 - 1.00 mg/dL   Calcium 9.6 8.9 - 44.0 mg/dL   GFR calc non Af Amer >60 >60 mL/min   GFR calc Af Amer >60 >60 mL/min   Anion gap 11 5 - 15  Urine microscopic-add on  Result Value Ref Range   Squamous Epithelial / LPF 6-30 (A) NONE SEEN   WBC, UA 6-30 0 - 5 WBC/hpf   RBC / HPF 6-30 0 - 5 RBC/hpf   Bacteria, UA MANY (A) NONE SEEN   Urine-Other MUCOUS PRESENT   Ethanol  Result Value Ref Range   Alcohol, Ethyl (B) <5 <5 mg/dL  Urine rapid drug screen (hosp performed)  Result Value Ref Range   Opiates NONE DETECTED NONE DETECTED   Cocaine POSITIVE (A) NONE DETECTED   Benzodiazepines POSITIVE (A) NONE DETECTED   Amphetamines NONE DETECTED NONE DETECTED   Tetrahydrocannabinol NONE DETECTED NONE DETECTED   Barbiturates NONE DETECTED NONE DETECTED  I-Stat beta hCG blood, ED ([facility], [facility], [facility] only)  Result Value Ref Range   I-stat hCG, quantitative <5.0 <5 mIU/mL   Comment 3           Dg Chest 2 View  06/16/2015  CLINICAL DATA:  Mid chest pain. Shortness of breath. Productive cough and fever for 2 days. EXAM: CHEST  2 VIEW COMPARISON:  None. FINDINGS: The heart size and mediastinal contours are within normal limits. Both lungs are clear. The visualized skeletal structures are unremarkable. IMPRESSION: No active cardiopulmonary disease. Electronically Signed   By: Gaylyn RongWalter  Liebkemann M.D.   On: 06/16/2015 16:57   [place] Abdomen Complete  06/01/2015  CLINICAL DATA:  Acute right upper quadrant abdominal pain radiating to the back for 2 days. Vomiting. EXAM: ABDOMEN ULTRASOUND COMPLETE COMPARISON:  08/03/2014, 07/29/2014 FINDINGS: Gallbladder: No gallstones or wall thickening visualized. No sonographic Murphy sign noted by sonographer. Common bile duct: Diameter: 5.4 mm Liver: Mild increased echogenicity. No focal hepatic abnormality  or biliary dilatation. IVC: No abnormality visualized. Pancreas: Visualized portion unremarkable. Spleen: Size and appearance within normal limits. Right Kidney: Length: 13.8 cm. Echogenicity within normal limits. No mass or hydronephrosis visualized. Left Kidney: Length: 14.1 cm. Echogenicity within normal limits. No mass or hydronephrosis visualized. Abdominal aorta: No aneurysm visualized. Other findings: Limited exam because of body habitus and bowel gas. IMPRESSION: No acute finding by abdominal ultrasound. Electronically Signed   By: Judie PetitM.  Shick M.D.   On: 06/01/2015 17:48   Ct Renal Stone Study  06/17/2015  CLINICAL DATA:  Right-sided pain, 2 days duration. Assess for renal stone disease. EXAM: CT ABDOMEN AND PELVIS WITHOUT CONTRAST TECHNIQUE: Multidetector CT imaging of the abdomen and pelvis was performed following the standard protocol without IV contrast. COMPARISON:  07/29/2014 FINDINGS: Lung bases are clear. No pleural or pericardial fluid. The liver is normal without contrast. No calcified gallstones. The spleen is normal. The pancreas is normal. The adrenal glands are normal. The kidneys are normal. No cyst, mass, stone or hydronephrosis. The aorta and IVC are normal. No retroperitoneal mass or adenopathy. No free intraperitoneal fluid or air. Uterus and adnexal regions are normal for age. No bladder abnormality. No acute bowel finding. Appendix appears normal. IMPRESSION: No change since the previous study. No cause of pain identified. No evidence of renal stone disease. Electronically Signed   By: Paulina FusiMark  Shogry M.D.   On: 06/17/2015 11:29      Pricilla LovelessScott Dantrell Schertzer, MD 06/17/15 1144
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
## 2015-06-18 LAB — URINE CULTURE: Culture: 7000

## 2015-06-21 ENCOUNTER — Encounter: Payer: Self-pay | Admitting: *Deleted

## 2015-06-29 ENCOUNTER — Ambulatory Visit: Payer: Self-pay | Admitting: Internal Medicine

## 2015-08-26 ENCOUNTER — Emergency Department (HOSPITAL_COMMUNITY)
Admission: EM | Admit: 2015-08-26 | Discharge: 2015-08-27 | Disposition: A | Discharge status: Home / Self Care | Payer: Medicaid Other | Attending: Emergency Medicine | Admitting: Emergency Medicine

## 2015-08-26 ENCOUNTER — Emergency Department (HOSPITAL_COMMUNITY): Payer: Medicaid Other

## 2015-08-26 ENCOUNTER — Encounter (HOSPITAL_COMMUNITY): Payer: Self-pay

## 2015-08-26 DIAGNOSIS — Z87442 Personal history of urinary calculi: Secondary | ICD-10-CM | POA: Diagnosis not present

## 2015-08-26 DIAGNOSIS — Z79899 Other long term (current) drug therapy: Secondary | ICD-10-CM | POA: Insufficient documentation

## 2015-08-26 DIAGNOSIS — F319 Bipolar disorder, unspecified: Secondary | ICD-10-CM | POA: Diagnosis not present

## 2015-08-26 DIAGNOSIS — G43909 Migraine, unspecified, not intractable, without status migrainosus: Secondary | ICD-10-CM | POA: Insufficient documentation

## 2015-08-26 DIAGNOSIS — F17210 Nicotine dependence, cigarettes, uncomplicated: Secondary | ICD-10-CM | POA: Diagnosis not present

## 2015-08-26 DIAGNOSIS — E669 Obesity, unspecified: Secondary | ICD-10-CM | POA: Diagnosis not present

## 2015-08-26 DIAGNOSIS — Z3202 Encounter for pregnancy test, result negative: Secondary | ICD-10-CM | POA: Insufficient documentation

## 2015-08-26 DIAGNOSIS — R1084 Generalized abdominal pain: Secondary | ICD-10-CM | POA: Insufficient documentation

## 2015-08-26 DIAGNOSIS — Z87440 Personal history of urinary (tract) infections: Secondary | ICD-10-CM | POA: Insufficient documentation

## 2015-08-26 DIAGNOSIS — Z8619 Personal history of other infectious and parasitic diseases: Secondary | ICD-10-CM | POA: Diagnosis not present

## 2015-08-26 DIAGNOSIS — R1010 Upper abdominal pain, unspecified: Secondary | ICD-10-CM | POA: Diagnosis present

## 2015-08-26 LAB — CBC
HCT: 37.7 % (ref 36.0–46.0)
Hemoglobin: 11.6 g/dL — ABNORMAL LOW (ref 12.0–15.0)
MCH: 24.1 pg — ABNORMAL LOW (ref 26.0–34.0)
MCHC: 30.8 g/dL (ref 30.0–36.0)
MCV: 78.2 fL (ref 78.0–100.0)
Platelets: 341 10*3/uL (ref 150–400)
RBC: 4.82 MIL/uL (ref 3.87–5.11)
RDW: 15.5 % (ref 11.5–15.5)
WBC: 9.7 10*3/uL (ref 4.0–10.5)

## 2015-08-26 LAB — BASIC METABOLIC PANEL
ANION GAP: 10 (ref 5–15)
BUN: 14 mg/dL (ref 6–20)
CHLORIDE: 104 mmol/L (ref 101–111)
CO2: 25 mmol/L (ref 22–32)
Calcium: 9.6 mg/dL (ref 8.9–10.3)
Creatinine, Ser: 0.68 mg/dL (ref 0.44–1.00)
GFR calc Af Amer: >60 mL/min (ref >60)
GLUCOSE: 90 mg/dL (ref 65–99)
POTASSIUM: 3.8 mmol/L (ref 3.5–5.1)
Sodium: 139 mmol/L (ref 135–145)

## 2015-08-26 LAB — I-STAT TROPONIN, ED: Troponin i, poc: 0 ng/mL (ref 0.00–0.08)

## 2015-08-26 LAB — I-STAT BETA HCG BLOOD, ED (MC, WL, AP ONLY)

## 2015-08-26 MED ORDER — FENTANYL CITRATE (PF) 100 MCG/2ML IJ SOLN
50.0000 ug | Freq: Once | INTRAMUSCULAR | Status: AC
  Administered 2015-08-27: 50 ug via INTRAVENOUS
  Filled 2015-08-26: qty 2

## 2015-08-26 NOTE — ED Notes (Signed)
Stated that she was having chest tightness that started last night.  Took a Vicodin this afternoon at 18:00 without relief.  Said that the pain starts in her chest and goes to her back.  Pain is currently at a 10/10

## 2015-08-26 NOTE — ED Notes (Signed)
PA at bedside.

## 2015-08-27 ENCOUNTER — Emergency Department (HOSPITAL_COMMUNITY): Payer: Medicaid Other

## 2015-08-27 LAB — HEPATIC FUNCTION PANEL
ALT: 36 U/L (ref 14–54)
AST: 25 U/L (ref 15–41)
Albumin: 3.6 g/dL (ref 3.5–5.0)
Alkaline Phosphatase: 61 U/L (ref 38–126)
Bilirubin, Direct: <0.1 mg/dL — ABNORMAL LOW (ref 0.1–0.5)
Total Bilirubin: 0.4 mg/dL (ref 0.3–1.2)
Total Protein: 7.9 g/dL (ref 6.5–8.1)

## 2015-08-27 LAB — LIPASE, BLOOD: Lipase: 29 U/L (ref 11–51)

## 2015-08-27 LAB — I-STAT TROPONIN, ED: Troponin i, poc: 0.01 ng/mL (ref 0.00–0.08)

## 2015-08-27 LAB — D-DIMER, QUANTITATIVE (NOT AT ARMC): D-Dimer, Quant: 0.37 ug/mL-FEU (ref 0.00–0.50)

## 2015-08-27 MED ORDER — IOHEXOL 300 MG/ML  SOLN
50.0000 mL | Freq: Once | INTRAMUSCULAR | Status: AC | PRN
  Administered 2015-08-27: 50 mL via ORAL

## 2015-08-27 MED ORDER — SODIUM CHLORIDE 0.9 % IV BOLUS (SEPSIS)
1000.0000 mL | Freq: Once | INTRAVENOUS | Status: AC
  Administered 2015-08-27: 1000 mL via INTRAVENOUS

## 2015-08-27 MED ORDER — PROMETHAZINE HCL 25 MG PO TABS: 25.0000 mg | ORAL_TABLET | Freq: Three times a day (TID) | ORAL | Status: DC | PRN

## 2015-08-27 MED ORDER — HYDROCODONE-ACETAMINOPHEN 5-325 MG PO TABS: 1.0000 | ORAL_TABLET | Freq: Four times a day (QID) | ORAL | Status: DC | PRN

## 2015-08-27 MED ORDER — DICYCLOMINE HCL 10 MG/ML IM SOLN
20.0000 mg | Freq: Once | INTRAMUSCULAR | Status: AC
  Administered 2015-08-27: 20 mg via INTRAMUSCULAR
  Filled 2015-08-27: qty 2

## 2015-08-27 MED ORDER — KETAMINE HCL-SODIUM CHLORIDE 100-0.9 MG/10ML-% IV SOSY
0.1000 mg/kg | PREFILLED_SYRINGE | Freq: Once | INTRAVENOUS | Status: AC
  Administered 2015-08-27: 15 mg via INTRAVENOUS
  Filled 2015-08-27: qty 10

## 2015-08-27 MED ORDER — KETOROLAC TROMETHAMINE 30 MG/ML IJ SOLN
30.0000 mg | Freq: Once | INTRAMUSCULAR | Status: AC
  Administered 2015-08-27: 30 mg via INTRAVENOUS
  Filled 2015-08-27: qty 1

## 2015-08-27 MED ORDER — IOHEXOL 300 MG/ML  SOLN
100.0000 mL | Freq: Once | INTRAMUSCULAR | Status: AC | PRN
Start: 2015-08-27 — End: 2015-08-27
  Administered 2015-08-27: 100 mL via INTRAVENOUS

## 2015-08-27 MED ORDER — HYDROMORPHONE HCL 1 MG/ML IJ SOLN
1.0000 mg | Freq: Once | INTRAMUSCULAR | Status: AC
  Administered 2015-08-27: 1 mg via INTRAVENOUS
  Filled 2015-08-27: qty 1

## 2015-08-27 NOTE — ED Notes (Signed)
Pt. Is upset and wanted to see the Doctor, pt. Stated that "you are not doing anything here" pt. Stated, ""im going to another hospital because i'm still in pain and you're not giving me enough pain medicine." this Nurse reminded the pt. Pain meds given and those procedures/test she had. Pt. Was cussing and wont let this Nurse disconnect her IV line, said "dont touch me!" Press photographerCharge Nurse is notified, PA Tresa EndoKelly is also aware.

## 2015-08-27 NOTE — ED Notes (Signed)
Pt with ultrasound will draw blood when finished.
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
## 2015-08-27 NOTE — Discharge Instructions (Signed)
Return here as needed.  Follow-up with your primary care doctor °

## 2015-08-27 NOTE — ED Provider Notes (Signed)
CSN: [id number]     Arrival date & time 08/26/15  1949 History   First MD Initiated Contact with Patient 08/26/15 2315     Chief Complaint  Patient presents with  . Chest Pain     (Consider location/radiation/quality/duration/timing/severity/associated sxs/prior Treatment) HPI Patient presents to the emergency department with upper abdominal pain that radiates to her back.  The patient states that palpation and movement make the pain worse.  She states that nothing seems to make the condition better.  The patient states that she did not take any medications prior to arrival.  Patient states that she has not had any nausea, any blurred vision, weakness, dizziness, diaphoresis, fever, cough, dysuria, incontinence, bloody stool, hematemesis, rash, near syncope or syncope.  Patient states that she has had some chest discomfort as well Past Medical History  Diagnosis Date  . Depression   . Bipolar 1 disorder, depressed (HCC)   . PTSD (post-traumatic stress disorder)   . Kidney stone   . Migraine   . Urinary tract infection   . Pregnancy induced hypertension   . Obesity   . UTI (lower urinary tract infection)   . Diabetes mellitus without complication (HCC)   . Hep C w/o coma, chronic (HCC)   . Cocaine abuse    Past Surgical History  Procedure Laterality Date  . Kidney stones    . Lipotripsy     Family History  Problem Relation Age of Onset  . Rheum arthritis Mother   . Asthma Mother   . Hypertension Mother   . Other Neg Hx   . CAD Other    Social History  Substance Use Topics  . Smoking status: Current Every Day Smoker -- 1.00 packs/day    Types: Cigarettes  . Smokeless tobacco: Never Used  . Alcohol Use: No     Comment: denied any alcohol use   OB History    Gravida Para Term Preterm AB TAB SAB Ectopic Multiple Living   3 2 2  1  1   2      Review of Systems All other systems negative except as documented in the HPI. All pertinent positives and negatives as reviewed  in the HPI.   Allergies  Darvocet; Morphine and related; Sulfa antibiotics; Tomato; and Pioglitazone  Home Medications   Prior to Admission medications   Medication Sig Start Date End Date Taking? Authorizing Provider  ALPRAZolam Prudy Feeler(XANAX) 1 MG tablet Take 1 mg by mouth 3 (three) times daily as needed for anxiety.   Yes Historical Provider, MD  atenolol (TENORMIN) 25 MG tablet Take 1 tablet (25 mg total) by mouth daily. 12/11/14  Yes Beau FannyJohn C Withrow, FNP  DULoxetine (CYMBALTA) 20 MG capsule Take 1 capsule (20 mg total) by mouth daily. 06/17/15  Yes Charm RingsJamison Y Lord, NP  hydrochlorothiazide (HYDRODIURIL) 25 MG tablet Take 25 mg by mouth 2 (two) times daily.   Yes Historical Provider, MD  HYDROcodone-acetaminophen (NORCO/VICODIN) 5-325 MG tablet Take 1 tablet by mouth 3 (three) times daily as needed for moderate pain.   Yes Historical Provider, MD  ibuprofen (ADVIL,MOTRIN) 800 MG tablet Take 800 mg by mouth every 8 (eight) hours as needed for moderate pain.   Yes Historical Provider, MD  omeprazole (PRILOSEC) 20 MG capsule Take 1 capsule (20 mg total) by mouth daily. 06/01/15  Yes Azalia BilisKevin Campos, MD  cephALEXin (KEFLEX) 500 MG capsule Take 1 capsule (500 mg total) by mouth every 8 (eight) hours. Patient not taking: Reported on 08/26/2015 06/17/15  Charm Rings, NP  ondansetron (ZOFRAN ODT) 8 MG disintegrating tablet Take 1 tablet (8 mg total) by mouth every 8 (eight) hours as needed for nausea or vomiting. Patient not taking: Reported on 08/26/2015 06/01/15   Azalia Bilis, MD   BP 118/74 mmHg  Pulse 83  Temp(Src) 97.9 F (36.6 C) (Oral)  Resp 18  Ht  (1.676 m)  Wt 150.141 kg  BMI 53.45 kg/m2  SpO2 100%  LMP 08/02/2015 (Approximate) Physical Exam  Constitutional: She is oriented to person, place, and time. She appears well-developed and well-nourished. No distress.  HENT:  Head: Normocephalic and atraumatic.  Mouth/Throat: Oropharynx is clear and moist.  Eyes: Pupils are equal, round, and  reactive to light.  Neck: Normal range of motion. Neck supple.  Cardiovascular: Normal rate, regular rhythm and normal heart sounds.  Exam reveals no gallop and no friction rub.   No murmur heard. Pulmonary/Chest: Effort normal and breath sounds normal. No respiratory distress. She has no wheezes.  Abdominal: Soft. Bowel sounds are normal. She exhibits no distension. There is tenderness. There is no rebound and no guarding.  Musculoskeletal: She exhibits no edema.  Neurological: She is alert and oriented to person, place, and time. She exhibits normal muscle tone. Coordination normal.  Skin: Skin is warm and dry. No rash noted. No erythema.  Psychiatric: She has a normal mood and affect. Her behavior is normal.  Nursing note and vitals reviewed.   ED Course  Procedures (including critical care time) Labs Review Labs Reviewed  CBC - Abnormal; Notable for the following:    Hemoglobin 11.6 (*)    MCH 24.1 (*)    All other components within normal limits  BASIC METABOLIC PANEL  D-DIMER, QUANTITATIVE (NOT AT [facility])  HEPATIC FUNCTION PANEL  LIPASE, BLOOD  I-STAT TROPOININ, ED  I-STAT BETA HCG BLOOD, ED ([facility], [facility], [facility] ONLY)  I-STAT TROPOININ, ED    Imaging Review Dg Chest 2 View  08/26/2015  CLINICAL DATA:  Sharp chest pain today.  Smoker. EXAM: CHEST  2 VIEW COMPARISON:  06/16/2015. FINDINGS: Normal sized heart. Clear lungs. Minimal thoracic spine degenerative changes. IMPRESSION: No acute abnormality. Electronically Signed   By: Beckie Salts M.D.   On: 08/26/2015 20:30   I have personally reviewed and evaluated these images and lab results as part of my medical decision-making.   EKG Interpretation   Date/Time:  Friday August 26 2015 19:55:27 EDT Ventricular Rate:  71 PR Interval:  156 QRS Duration: 89 QT Interval:  383 QTC Calculation: 416 R Axis:   71 Text Interpretation:  Sinus arrhythmia Confirmed by ZACKOWSKI  MD, SCOTT  [id number]) on 08/26/2015 8:02:19 PM       Patient is  awaiting ultrasound and hepatic function testing.  I spoke with Antony Madura, PA-C, about the patient she will follow up   Charlestine Night, PA-C 08/27/15 0225  Geoffery Lyons, MD 08/27/15 [phone]

## 2015-12-10 ENCOUNTER — Inpatient Hospital Stay (HOSPITAL_COMMUNITY)
Admission: AD | Admit: 2015-12-10 | Discharge: 2015-12-10 | Disposition: A | Discharge status: Home / Self Care | Payer: Medicaid Other | Source: Ambulatory Visit | Attending: Family Medicine | Admitting: Family Medicine

## 2015-12-10 ENCOUNTER — Encounter (HOSPITAL_COMMUNITY): Payer: Self-pay | Admitting: *Deleted

## 2015-12-10 DIAGNOSIS — A5901 Trichomonal vulvovaginitis: Secondary | ICD-10-CM

## 2015-12-10 DIAGNOSIS — Z885 Allergy status to narcotic agent status: Secondary | ICD-10-CM | POA: Diagnosis not present

## 2015-12-10 DIAGNOSIS — Z882 Allergy status to sulfonamides status: Secondary | ICD-10-CM | POA: Diagnosis not present

## 2015-12-10 DIAGNOSIS — F17210 Nicotine dependence, cigarettes, uncomplicated: Secondary | ICD-10-CM | POA: Insufficient documentation

## 2015-12-10 DIAGNOSIS — M549 Dorsalgia, unspecified: Secondary | ICD-10-CM | POA: Insufficient documentation

## 2015-12-10 DIAGNOSIS — Z91018 Allergy to other foods: Secondary | ICD-10-CM | POA: Diagnosis not present

## 2015-12-10 DIAGNOSIS — R109 Unspecified abdominal pain: Secondary | ICD-10-CM | POA: Diagnosis not present

## 2015-12-10 DIAGNOSIS — F1990 Other psychoactive substance use, unspecified, uncomplicated: Secondary | ICD-10-CM

## 2015-12-10 LAB — WET PREP, GENITAL
SPERM: NONE SEEN
YEAST WET PREP: NONE SEEN

## 2015-12-10 LAB — CBC WITH DIFFERENTIAL/PLATELET
BASOS PCT: 0 %
Basophils Absolute: 0 10*3/uL (ref 0.0–0.1)
EOS ABS: 0.3 10*3/uL (ref 0.0–0.7)
Eosinophils Relative: 3 %
HCT: 35.2 % — ABNORMAL LOW (ref 36.0–46.0)
HEMOGLOBIN: 11.2 g/dL — AB (ref 12.0–15.0)
Lymphocytes Relative: 29 %
Lymphs Abs: 3.1 10*3/uL (ref 0.7–4.0)
MCH: 23.7 pg — ABNORMAL LOW (ref 26.0–34.0)
MCHC: 31.8 g/dL (ref 30.0–36.0)
MCV: 74.4 fL — ABNORMAL LOW (ref 78.0–100.0)
MONO ABS: 0.5 10*3/uL (ref 0.1–1.0)
MONOS PCT: 5 %
NEUTROS PCT: 63 %
Neutro Abs: 6.7 10*3/uL (ref 1.7–7.7)
Platelets: 272 10*3/uL (ref 150–400)
RBC: 4.73 MIL/uL (ref 3.87–5.11)
RDW: 18.8 % — AB (ref 11.5–15.5)
WBC: 10.5 10*3/uL (ref 4.0–10.5)

## 2015-12-10 LAB — RAPID HIV SCREEN (HIV 1/2 AB+AG)
HIV 1/2 ANTIBODIES: NONREACTIVE
HIV-1 P24 ANTIGEN - HIV24: NONREACTIVE

## 2015-12-10 LAB — RAPID URINE DRUG SCREEN, HOSP PERFORMED
Amphetamines: NOT DETECTED
BARBITURATES: NOT DETECTED
BENZODIAZEPINES: NOT DETECTED
COCAINE: NOT DETECTED
OPIATES: NOT DETECTED
Tetrahydrocannabinol: NOT DETECTED

## 2015-12-10 LAB — URINALYSIS, ROUTINE W REFLEX MICROSCOPIC
BILIRUBIN URINE: NEGATIVE
Glucose, UA: NEGATIVE mg/dL
Hgb urine dipstick: NEGATIVE
KETONES UR: NEGATIVE mg/dL
NITRITE: NEGATIVE
PH: 6 (ref 5.0–8.0)
PROTEIN: NEGATIVE mg/dL
Specific Gravity, Urine: 1.02 (ref 1.005–1.030)

## 2015-12-10 LAB — URINE MICROSCOPIC-ADD ON: RBC / HPF: NONE SEEN RBC/hpf (ref 0–5)

## 2015-12-10 LAB — POCT PREGNANCY, URINE: PREG TEST UR: NEGATIVE

## 2015-12-10 MED ORDER — LACTATED RINGERS IV SOLN: INTRAVENOUS | Status: DC

## 2015-12-10 MED ORDER — AZITHROMYCIN 250 MG PO TABS
1000.0000 mg | ORAL_TABLET | Freq: Once | ORAL | Status: AC
  Administered 2015-12-10: 1000 mg via ORAL
  Filled 2015-12-10: qty 4

## 2015-12-10 MED ORDER — ONDANSETRON 8 MG PO TBDP: 8.0000 mg | ORAL_TABLET | Freq: Three times a day (TID) | ORAL | Status: DC | PRN

## 2015-12-10 MED ORDER — SODIUM CHLORIDE 0.9 % IV SOLN
INTRAVENOUS | Status: DC
  Administered 2015-12-10: 19:00:00 via INTRAVENOUS

## 2015-12-10 MED ORDER — METRONIDAZOLE 500 MG PO TABS: 500.0000 mg | ORAL_TABLET | Freq: Two times a day (BID) | ORAL | Status: DC

## 2015-12-10 MED ORDER — OXYCODONE-ACETAMINOPHEN 5-325 MG PO TABS: 1.0000 | ORAL_TABLET | ORAL | Status: DC | PRN

## 2015-12-10 MED ORDER — KETOROLAC TROMETHAMINE 60 MG/2ML IM SOLN
60.0000 mg | Freq: Once | INTRAMUSCULAR | Status: AC
  Administered 2015-12-10: 60 mg via INTRAMUSCULAR
  Filled 2015-12-10: qty 2

## 2015-12-10 MED ORDER — DEXTROSE 5 % IV SOLN
2.0000 g | INTRAVENOUS | Status: DC
  Administered 2015-12-10: 2 g via INTRAVENOUS
  Filled 2015-12-10: qty 2

## 2015-12-10 MED ORDER — METRONIDAZOLE IN NACL 5-0.79 MG/ML-% IV SOLN
500.0000 mg | Freq: Once | INTRAVENOUS | Status: AC
  Administered 2015-12-10: 500 mg via INTRAVENOUS
  Filled 2015-12-10: qty 100

## 2015-12-10 MED ORDER — OXYCODONE-ACETAMINOPHEN 5-325 MG PO TABS
2.0000 | ORAL_TABLET | Freq: Once | ORAL | Status: AC
  Administered 2015-12-10: 2 via ORAL
  Filled 2015-12-10: qty 2

## 2015-12-10 MED ORDER — ONDANSETRON 8 MG PO TBDP
8.0000 mg | ORAL_TABLET | Freq: Once | ORAL | Status: AC
  Administered 2015-12-10: 8 mg via ORAL
  Filled 2015-12-10: qty 1

## 2015-12-10 NOTE — MAU Note (Signed)
Pt c/o back pain and pelvic pain x 1 month. Has not had a period in 5 months. Had a positive pregnancy test in March then it was negative in April.[name]. Has irregular periods.c/o nasuea and headaches.

## 2015-12-10 NOTE — Discharge Instructions (Signed)
Trichomoniasis Trichomoniasis is an infection caused by an organism called Trichomonas. The infection can affect both women and men. In women, the outer female genitalia and the vagina are affected. In men, the penis is mainly affected, but the prostate and other reproductive organs can also be involved. Trichomoniasis is a sexually transmitted infection (STI) and is most often passed to another person through sexual contact.  RISK FACTORS  Having unprotected sexual intercourse.  Having sexual intercourse with an infected partner. SIGNS AND SYMPTOMS  Symptoms of trichomoniasis in women include:  Abnormal gray-green frothy vaginal discharge.  Itching and irritation of the vagina.  Itching and irritation of the area outside the vagina. Symptoms of trichomoniasis in men include:   Penile discharge with or without pain.  Pain during urination. This results from inflammation of the urethra. DIAGNOSIS  Trichomoniasis may be found during a Pap test or physical exam. Your health care provider may use one of the following methods to help diagnose this infection:  Testing the pH of the vagina with a test tape.  Using a vaginal swab test that checks for the Trichomonas organism. A test is available that provides results within a few minutes.  Examining a urine sample.  Testing vaginal secretions. Your health care provider may test you for other STIs, including HIV. TREATMENT   You may be given medicine to fight the infection. Women should inform their health care provider if they could be or are pregnant. Some medicines used to treat the infection should not be taken during pregnancy.  Your health care provider may recommend over-the-counter medicines or creams to decrease itching or irritation.  Your sexual partner will need to be treated if infected.  Your health care provider may test you for infection again 3 months after treatment. HOME CARE INSTRUCTIONS   Take medicines only as  directed by your health care provider.  Take over-the-counter medicine for itching or irritation as directed by your health care provider.  Do not have sexual intercourse while you have the infection.  Women should not douche or wear tampons while they have the infection.  Discuss your infection with your partner. Your partner may have gotten the infection from you, or you may have gotten it from your partner.  Have your sex partner get examined and treated if necessary.  Practice safe, informed, and protected sex.  See your health care provider for other STI testing. SEEK MEDICAL CARE IF:   You still have symptoms after you finish your medicine.  You develop abdominal pain.  You have pain when you urinate.  You have bleeding after sexual intercourse.  You develop a rash.  Your medicine makes you sick or makes you throw up (vomit). MAKE SURE YOU:  Understand these instructions.  Will watch your condition.  Will get help right away if you are not doing well or get worse.   This information is not intended to replace advice given to you by your health care provider. Make sure you discuss any questions you have with your health care provider.   Document Released: 11/21/2000 Document Revised: 06/18/2014 Document Reviewed: 03/09/2013 Elsevier Interactive Patient Education [employer].  Sexually Transmitted Disease A sexually transmitted disease (STD) is a disease or infection often passed to another person during sex. However, STDs can be passed through nonsexual ways. An STD can be passed through:  Spit (saliva).  Semen.  Blood.  Mucus from the vagina.  Pee (urine). HOW CAN I LESSEN MY CHANCES OF GETTING AN STD?  Use:  Latex condoms.  Water-soluble lubricants with condoms. Do not use petroleum jelly or oils.  Dental dams. These are small pieces of latex that are used as a barrier during oral sex.  Avoid having more than one sex partner.  Do not have  sex with someone who has other sex partners.  Do not have sex with anyone you do not know or who is at high risk for an STD.  Avoid risky sex that can break your skin.  Do not have sex if you have open sores on your mouth or skin.  Avoid drinking too much alcohol or taking illegal drugs. Alcohol and drugs can affect your good judgment.  Avoid oral and anal sex acts.  Get shots (vaccines) for HPV and hepatitis.  If you are at risk of being infected with HIV, it is advised that you take a certain medicine daily to prevent HIV infection. This is called pre-exposure prophylaxis (PrEP). You may be at risk if:  You are a man who has sex with other men (MSM).  You are attracted to the opposite sex (heterosexual) and are having sex with more than one partner.  You take drugs with a needle.  You have sex with someone who has HIV.  Talk with your doctor about if you are at high risk of being infected with HIV. If you begin to take PrEP, get tested for HIV first. Get tested every 3 months for as long as you are taking PrEP.  Get tested for STDs every year if you are sexually active. If you are treated for an STD, get tested again 3 months after you are treated. WHAT SHOULD I DO IF I THINK I HAVE AN STD?  See your doctor.  Tell your sex partner(s) that you have an STD. They should be tested and treated.  Do not have sex until your doctor says it is okay. WHEN SHOULD I GET HELP? Get help right away if:  You have bad belly (abdominal) pain.  You are a man and have puffiness (swelling) or pain in your testicles.  You are a woman and have puffiness in your vagina.   This information is not intended to replace advice given to you by your health care provider. Make sure you discuss any questions you have with your health care provider.   Document Released: 07/05/2004 Document Revised: 06/18/2014 Document Reviewed: 11/21/2012 Elsevier Interactive Patient Education 2016 Elsevier  Inc. Bacterial Vaginosis Bacterial vaginosis is a vaginal infection that occurs when the normal balance of bacteria in the vagina is disrupted. It results from an overgrowth of certain bacteria. This is the most common vaginal infection in women of childbearing age. Treatment is important to prevent complications, especially in pregnant women, as it can cause a premature delivery. CAUSES  Bacterial vaginosis is caused by an increase in harmful bacteria that are normally present in smaller amounts in the vagina. Several different kinds of bacteria can cause bacterial vaginosis. However, the reason that the condition develops is not fully understood. RISK FACTORS Certain activities or behaviors can put you at an increased risk of developing bacterial vaginosis, including:  Having a new sex partner or multiple sex partners.  Douching.  Using an intrauterine device (IUD) for contraception. Women do not get bacterial vaginosis from toilet seats, bedding, swimming pools, or contact with objects around them. SIGNS AND SYMPTOMS  Some women with bacterial vaginosis have no signs or symptoms. Common symptoms include:  Grey vaginal discharge.  A fishlike odor  with discharge, especially after sexual intercourse.  Itching or burning of the vagina and vulva.  Burning or pain with urination. DIAGNOSIS  Your health care provider will take a medical history and examine the vagina for signs of bacterial vaginosis. A sample of vaginal fluid may be taken. Your health care provider will look at this sample under a microscope to check for bacteria and abnormal cells. A vaginal pH test may also be done.  TREATMENT  Bacterial vaginosis may be treated with antibiotic medicines. These may be given in the form of a pill or a vaginal cream. A second round of antibiotics may be prescribed if the condition comes back after treatment. Because bacterial vaginosis increases your risk for sexually transmitted diseases,  getting treated can help reduce your risk for chlamydia, gonorrhea, HIV, and herpes. HOME CARE INSTRUCTIONS   Only take over-the-counter or prescription medicines as directed by your health care provider.  If antibiotic medicine was prescribed, take it as directed. Make sure you finish it even if you start to feel better.  Tell all sexual partners that you have a vaginal infection. They should see their health care provider and be treated if they have problems, such as a mild rash or itching.  During treatment, it is important that you follow these instructions:  Avoid sexual activity or use condoms correctly.  Do not douche.  Avoid alcohol as directed by your health care provider.  Avoid breastfeeding as directed by your health care provider. SEEK MEDICAL CARE IF:   Your symptoms are not improving after 3 days of treatment.  You have increased discharge or pain.  You have a fever. MAKE SURE YOU:   Understand these instructions.  Will watch your condition.  Will get help right away if you are not doing well or get worse. FOR MORE INFORMATION  Centers for Disease Control and Prevention, Division of STD Prevention: SolutionApps.co.zawww.cdc.gov/std American Sexual Health Association (ASHA): www.ashastd.org    This information is not intended to replace advice given to you by your health care provider. Make sure you discuss any questions you have with your health care provider.   Document Released: 05/28/2005 Document Revised: 06/18/2014 Document Reviewed: 01/07/2013 Elsevier Interactive Patient Education [employer]2016 Elsevier Inc.

## 2015-12-10 NOTE — MAU Provider Note (Signed)
History   G3P2012 no pregnant in with abd pain that has been going on for one month. Pt has had one partner for the past five years. Pt states pain is a dull throbbing pain that radiates to her back. Pt has hx of poly substance abuse.  CSN: [id number]  Arrival date & time 12/10/15  1725   First Provider Initiated Contact with Patient 12/10/15 1841      Chief Complaint  Patient presents with  . Back Pain  . Abdominal Pain    HPI  Past Medical History  Diagnosis Date  . Depression   . Bipolar 1 disorder, depressed (HCC)   . PTSD (post-traumatic stress disorder)   . Kidney stone   . Migraine   . Urinary tract infection   . Pregnancy induced hypertension   . Obesity   . UTI (lower urinary tract infection)   . Diabetes mellitus without complication (HCC)   . Hep C w/o coma, chronic (HCC)   . Cocaine abuse     Past Surgical History  Procedure Laterality Date  . Kidney stones    . Lipotripsy      Family History  Problem Relation Age of Onset  . Rheum arthritis Mother   . Asthma Mother   . Hypertension Mother   . Other Neg Hx   . CAD Other     Social History  Substance Use Topics  . Smoking status: Current Every Day Smoker -- 1.00 packs/day    Types: Cigarettes  . Smokeless tobacco: Never Used  . Alcohol Use: No     Comment: denied any alcohol use    OB History    Gravida Para Term Preterm AB TAB SAB Ectopic Multiple Living   3 2 2  1  1   2       Review of Systems  Constitutional: Negative.   HENT: Negative.   Eyes: Negative.   Respiratory: Negative.   Cardiovascular: Negative.   Gastrointestinal: Positive for abdominal pain.  Endocrine: Negative.   Genitourinary: Negative.   Musculoskeletal: Negative.   Skin: Negative.   Allergic/Immunologic: Negative.   Neurological: Negative.   Hematological: Negative.   Psychiatric/Behavioral: Negative.     Allergies  Darvocet; Morphine and related; Sulfa antibiotics; Tomato; and Pioglitazone  Home  Medications  No current outpatient prescriptions on file.  BP 149/99 mmHg  Pulse 81  Temp(Src) 98.4 F (36.9 C) (Oral)  Resp 18  Ht 5\' 6"  (1.676 m)  Wt 343 lb (155.584 kg)  BMI 55.39 kg/m2  LMP 06/29/2015  Physical Exam  Constitutional: She is oriented to person, place, and time. She appears well-developed and well-nourished.  HENT:  Head: Normocephalic.  Eyes: Pupils are equal, round, and reactive to light.  Neck: Normal range of motion.  Cardiovascular: Normal rate, regular rhythm, normal heart sounds and intact distal pulses.   Pulmonary/Chest: Effort normal and breath sounds normal.  Abdominal: Soft. Bowel sounds are normal.  Genitourinary:  Foul odor to discharge.  Musculoskeletal: Normal range of motion.  Neurological: She is alert and oriented to person, place, and time. She has normal reflexes.  Skin: Skin is warm and dry.  Psychiatric: She has a normal mood and affect. Her behavior is normal. Judgment and thought content normal.    MAU Course  Procedures (including critical care time)  Labs Reviewed  WET PREP, GENITAL - Abnormal; Notable for the following:    Trich, Wet Prep PRESENT (*)    Clue Cells Wet Prep HPF POC PRESENT (*)  WBC, Wet Prep HPF POC MODERATE (*)    All other components within normal limits  URINALYSIS, ROUTINE W REFLEX MICROSCOPIC (NOT [facility]) - Abnormal; Notable for the following:    Leukocytes, UA LARGE (*)    All other components within normal limits  URINE MICROSCOPIC-ADD ON - Abnormal; Notable for the following:    Squamous Epithelial / LPF 6-30 (*)    Bacteria, UA RARE (*)    All other components within normal limits  CBC WITH DIFFERENTIAL/PLATELET  POCT PREGNANCY, URINE  GC/CHLAMYDIA PROBE AMP ([place]) NOT AT [facility]   No results found.   No diagnosis found.    MDM  Wet prep trich, GC and Chla obtained. CBC stable. Counseled pt on dx. Pt upset and crying. Will tx with rocephen, azithro and flagyl. Will d/c  home

## 2015-12-11 LAB — RPR: RPR: NONREACTIVE

## 2015-12-12 LAB — GC/CHLAMYDIA PROBE AMP (~~LOC~~) NOT AT ARMC
Chlamydia: POSITIVE — AB
Neisseria Gonorrhea: POSITIVE — AB

## 2016-03-09 ENCOUNTER — Encounter (HOSPITAL_COMMUNITY): Payer: Self-pay | Admitting: Oncology

## 2016-03-09 ENCOUNTER — Emergency Department (HOSPITAL_COMMUNITY)
Admission: EM | Admit: 2016-03-09 | Discharge: 2016-03-10 | Disposition: A | Discharge status: Other Acute Inpatient Hospital | Payer: Medicaid Other | Attending: Emergency Medicine | Admitting: Emergency Medicine

## 2016-03-09 DIAGNOSIS — T50904A Poisoning by unspecified drugs, medicaments and biological substances, undetermined, initial encounter: Secondary | ICD-10-CM

## 2016-03-09 DIAGNOSIS — F17210 Nicotine dependence, cigarettes, uncomplicated: Secondary | ICD-10-CM | POA: Insufficient documentation

## 2016-03-09 DIAGNOSIS — F1110 Opioid abuse, uncomplicated: Secondary | ICD-10-CM

## 2016-03-09 DIAGNOSIS — Z79899 Other long term (current) drug therapy: Secondary | ICD-10-CM | POA: Diagnosis not present

## 2016-03-09 DIAGNOSIS — T426X4A Poisoning by other antiepileptic and sedative-hypnotic drugs, undetermined, initial encounter: Secondary | ICD-10-CM | POA: Diagnosis not present

## 2016-03-09 DIAGNOSIS — E119 Type 2 diabetes mellitus without complications: Secondary | ICD-10-CM | POA: Insufficient documentation

## 2016-03-09 DIAGNOSIS — L03119 Cellulitis of unspecified part of limb: Secondary | ICD-10-CM

## 2016-03-09 HISTORY — DX: Other psychoactive substance abuse, uncomplicated: F19.10

## 2016-03-09 LAB — RAPID URINE DRUG SCREEN, HOSP PERFORMED
Amphetamines: NOT DETECTED
Barbiturates: NOT DETECTED
Benzodiazepines: NOT DETECTED
COCAINE: POSITIVE — AB
OPIATES: NOT DETECTED
TETRAHYDROCANNABINOL: POSITIVE — AB

## 2016-03-09 LAB — COMPREHENSIVE METABOLIC PANEL
ALT: 18 U/L (ref 14–54)
ANION GAP: 8 (ref 5–15)
AST: 17 U/L (ref 15–41)
Albumin: 3.7 g/dL (ref 3.5–5.0)
Alkaline Phosphatase: 62 U/L (ref 38–126)
BUN: 9 mg/dL (ref 6–20)
CALCIUM: 9.1 mg/dL (ref 8.9–10.3)
CHLORIDE: 104 mmol/L (ref 101–111)
CO2: 25 mmol/L (ref 22–32)
CREATININE: 0.8 mg/dL (ref 0.44–1.00)
Glucose, Bld: 91 mg/dL (ref 65–99)
Potassium: 3.8 mmol/L (ref 3.5–5.1)
SODIUM: 137 mmol/L (ref 135–145)
Total Bilirubin: 0.5 mg/dL (ref 0.3–1.2)
Total Protein: 8.2 g/dL — ABNORMAL HIGH (ref 6.5–8.1)

## 2016-03-09 LAB — CBC
HCT: 38.5 % (ref 36.0–46.0)
HEMOGLOBIN: 12.1 g/dL (ref 12.0–15.0)
MCH: 25.5 pg — AB (ref 26.0–34.0)
MCHC: 31.4 g/dL (ref 30.0–36.0)
MCV: 81.1 fL (ref 78.0–100.0)
PLATELETS: 312 10*3/uL (ref 150–400)
RBC: 4.75 MIL/uL (ref 3.87–5.11)
RDW: 16.9 % — ABNORMAL HIGH (ref 11.5–15.5)
WBC: 13 10*3/uL — ABNORMAL HIGH (ref 4.0–10.5)

## 2016-03-09 LAB — ETHANOL

## 2016-03-09 LAB — ACETAMINOPHEN LEVEL

## 2016-03-09 LAB — SALICYLATE LEVEL

## 2016-03-09 MED ORDER — CLONIDINE HCL 0.1 MG PO TABS: 0.1000 mg | ORAL_TABLET | Freq: Every day | ORAL | Status: DC

## 2016-03-09 MED ORDER — LOPERAMIDE HCL 2 MG PO CAPS: 2.0000 mg | ORAL_CAPSULE | ORAL | Status: DC | PRN

## 2016-03-09 MED ORDER — IBUPROFEN 200 MG PO TABS
600.0000 mg | ORAL_TABLET | Freq: Three times a day (TID) | ORAL | Status: DC | PRN
  Administered 2016-03-10: 600 mg via ORAL
  Filled 2016-03-09: qty 3

## 2016-03-09 MED ORDER — CLONIDINE HCL 0.1 MG PO TABS
0.1000 mg | ORAL_TABLET | Freq: Four times a day (QID) | ORAL | Status: DC
  Administered 2016-03-10: 0.1 mg via ORAL
  Filled 2016-03-09: qty 1

## 2016-03-09 MED ORDER — HYDROXYZINE HCL 25 MG PO TABS
25.0000 mg | ORAL_TABLET | Freq: Four times a day (QID) | ORAL | Status: DC | PRN
  Administered 2016-03-10: 25 mg via ORAL
  Filled 2016-03-09: qty 1

## 2016-03-09 MED ORDER — NAPROXEN 500 MG PO TABS: 500.0000 mg | ORAL_TABLET | Freq: Two times a day (BID) | ORAL | Status: DC | PRN

## 2016-03-09 MED ORDER — DICYCLOMINE HCL 20 MG PO TABS: 20.0000 mg | ORAL_TABLET | Freq: Four times a day (QID) | ORAL | Status: DC | PRN

## 2016-03-09 MED ORDER — LORAZEPAM 1 MG PO TABS
1.0000 mg | ORAL_TABLET | Freq: Three times a day (TID) | ORAL | Status: DC | PRN
  Administered 2016-03-10: 1 mg via ORAL
  Filled 2016-03-09: qty 1

## 2016-03-09 MED ORDER — ONDANSETRON HCL 4 MG PO TABS: 4.0000 mg | ORAL_TABLET | Freq: Three times a day (TID) | ORAL | Status: DC | PRN

## 2016-03-09 MED ORDER — CLONIDINE HCL 0.1 MG PO TABS: 0.1000 mg | ORAL_TABLET | ORAL | Status: DC

## 2016-03-09 MED ORDER — DOXYCYCLINE HYCLATE 100 MG PO TABS
100.0000 mg | ORAL_TABLET | Freq: Two times a day (BID) | ORAL | Status: DC
  Administered 2016-03-10: 100 mg via ORAL
  Filled 2016-03-09: qty 1

## 2016-03-09 MED ORDER — METHOCARBAMOL 500 MG PO TABS
500.0000 mg | ORAL_TABLET | Freq: Three times a day (TID) | ORAL | Status: DC | PRN
  Administered 2016-03-10: 500 mg via ORAL
  Filled 2016-03-09: qty 1

## 2016-03-09 MED ORDER — ATENOLOL 25 MG PO TABS: 25.0000 mg | ORAL_TABLET | Freq: Every day | ORAL | Status: DC
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
## 2016-03-09 NOTE — ED Notes (Signed)
Bed: [facility] Expected date:  Expected time:  Means of arrival:  Comments: EMS 30yo F 4 Gabapentin / SI

## 2016-03-10 ENCOUNTER — Encounter (HOSPITAL_COMMUNITY): Payer: Self-pay | Admitting: Nurse Practitioner

## 2016-03-10 ENCOUNTER — Inpatient Hospital Stay (HOSPITAL_COMMUNITY)
Admission: AD | Admit: 2016-03-10 | Discharge: 2016-03-12 | DRG: 897 | Disposition: A | Discharge status: Home / Self Care | Payer: Medicaid Other | Source: Intra-hospital | Attending: Psychiatry | Admitting: Psychiatry

## 2016-03-10 DIAGNOSIS — Z6841 Body Mass Index (BMI) 40.0 and over, adult: Secondary | ICD-10-CM | POA: Diagnosis not present

## 2016-03-10 DIAGNOSIS — Z590 Homelessness: Secondary | ICD-10-CM

## 2016-03-10 DIAGNOSIS — Z79899 Other long term (current) drug therapy: Secondary | ICD-10-CM | POA: Diagnosis not present

## 2016-03-10 DIAGNOSIS — T426X4A Poisoning by other antiepileptic and sedative-hypnotic drugs, undetermined, initial encounter: Secondary | ICD-10-CM | POA: Diagnosis not present

## 2016-03-10 DIAGNOSIS — E119 Type 2 diabetes mellitus without complications: Secondary | ICD-10-CM | POA: Diagnosis present

## 2016-03-10 DIAGNOSIS — Z8261 Family history of arthritis: Secondary | ICD-10-CM

## 2016-03-10 DIAGNOSIS — M549 Dorsalgia, unspecified: Secondary | ICD-10-CM | POA: Diagnosis present

## 2016-03-10 DIAGNOSIS — Z87442 Personal history of urinary calculi: Secondary | ICD-10-CM

## 2016-03-10 DIAGNOSIS — G4700 Insomnia, unspecified: Secondary | ICD-10-CM | POA: Diagnosis present

## 2016-03-10 DIAGNOSIS — F319 Bipolar disorder, unspecified: Secondary | ICD-10-CM | POA: Diagnosis present

## 2016-03-10 DIAGNOSIS — Z8249 Family history of ischemic heart disease and other diseases of the circulatory system: Secondary | ICD-10-CM | POA: Diagnosis not present

## 2016-03-10 DIAGNOSIS — F4310 Post-traumatic stress disorder, unspecified: Secondary | ICD-10-CM | POA: Diagnosis present

## 2016-03-10 DIAGNOSIS — B182 Chronic viral hepatitis C: Secondary | ICD-10-CM | POA: Diagnosis present

## 2016-03-10 DIAGNOSIS — F1994 Other psychoactive substance use, unspecified with psychoactive substance-induced mood disorder: Secondary | ICD-10-CM

## 2016-03-10 DIAGNOSIS — E669 Obesity, unspecified: Secondary | ICD-10-CM | POA: Diagnosis present

## 2016-03-10 DIAGNOSIS — F1190 Opioid use, unspecified, uncomplicated: Secondary | ICD-10-CM | POA: Diagnosis present

## 2016-03-10 DIAGNOSIS — F1494 Cocaine use, unspecified with cocaine-induced mood disorder: Secondary | ICD-10-CM

## 2016-03-10 DIAGNOSIS — F1914 Other psychoactive substance abuse with psychoactive substance-induced mood disorder: Secondary | ICD-10-CM | POA: Diagnosis present

## 2016-03-10 DIAGNOSIS — Z915 Personal history of self-harm: Secondary | ICD-10-CM | POA: Diagnosis not present

## 2016-03-10 DIAGNOSIS — F17210 Nicotine dependence, cigarettes, uncomplicated: Secondary | ICD-10-CM | POA: Diagnosis present

## 2016-03-10 DIAGNOSIS — F1424 Cocaine dependence with cocaine-induced mood disorder: Principal | ICD-10-CM | POA: Diagnosis present

## 2016-03-10 DIAGNOSIS — Z825 Family history of asthma and other chronic lower respiratory diseases: Secondary | ICD-10-CM | POA: Diagnosis not present

## 2016-03-10 DIAGNOSIS — F0630 Mood disorder due to known physiological condition, unspecified: Secondary | ICD-10-CM

## 2016-03-10 DIAGNOSIS — G8929 Other chronic pain: Secondary | ICD-10-CM | POA: Diagnosis present

## 2016-03-10 LAB — LIPID PANEL
CHOL/HDL RATIO: 4.8 ratio
Cholesterol: 148 mg/dL (ref 0–200)
HDL: 31 mg/dL — ABNORMAL LOW (ref >40)
LDL CALC: 80 mg/dL (ref 0–99)
Triglycerides: 187 mg/dL — ABNORMAL HIGH (ref <150)
VLDL: 37 mg/dL (ref 0–40)

## 2016-03-10 LAB — TSH: TSH: 0.874 u[IU]/mL (ref 0.350–4.500)

## 2016-03-10 MED ORDER — TRAZODONE HCL 50 MG PO TABS
50.0000 mg | ORAL_TABLET | Freq: Every evening | ORAL | Status: DC | PRN
  Administered 2016-03-10 – 2016-03-11 (×3): 50 mg via ORAL
  Filled 2016-03-10 (×7): qty 1

## 2016-03-10 MED ORDER — ONDANSETRON 4 MG PO TBDP: 4.0000 mg | ORAL_TABLET | Freq: Four times a day (QID) | ORAL | Status: DC | PRN

## 2016-03-10 MED ORDER — METHOCARBAMOL 500 MG PO TABS
500.0000 mg | ORAL_TABLET | Freq: Three times a day (TID) | ORAL | Status: DC | PRN
  Administered 2016-03-11: 500 mg via ORAL
  Filled 2016-03-10: qty 1

## 2016-03-10 MED ORDER — GABAPENTIN 300 MG PO CAPS
300.0000 mg | ORAL_CAPSULE | Freq: Every day | ORAL | Status: DC
  Administered 2016-03-10 – 2016-03-11 (×2): 300 mg via ORAL
  Filled 2016-03-10 (×4): qty 1

## 2016-03-10 MED ORDER — BECLOMETHASONE DIPROPIONATE 40 MCG/ACT IN AERS: 2.0000 | INHALATION_SPRAY | Freq: Two times a day (BID) | RESPIRATORY_TRACT | Status: DC

## 2016-03-10 MED ORDER — HYDROXYZINE HCL 25 MG PO TABS
25.0000 mg | ORAL_TABLET | Freq: Four times a day (QID) | ORAL | Status: DC | PRN
  Administered 2016-03-11: 25 mg via ORAL
  Filled 2016-03-10: qty 1

## 2016-03-10 MED ORDER — NAPROXEN 500 MG PO TABS
500.0000 mg | ORAL_TABLET | Freq: Two times a day (BID) | ORAL | Status: DC | PRN
  Administered 2016-03-11: 500 mg via ORAL
  Filled 2016-03-10: qty 1

## 2016-03-10 MED ORDER — ALBUTEROL SULFATE HFA 108 (90 BASE) MCG/ACT IN AERS: 2.0000 | INHALATION_SPRAY | Freq: Four times a day (QID) | RESPIRATORY_TRACT | Status: DC | PRN

## 2016-03-10 MED ORDER — BUDESONIDE 0.25 MG/2ML IN SUSP
0.2500 mg | Freq: Two times a day (BID) | RESPIRATORY_TRACT | Status: DC
  Administered 2016-03-10: 0.25 mg via RESPIRATORY_TRACT
  Filled 2016-03-10 (×5): qty 2

## 2016-03-10 MED ORDER — DOXYCYCLINE HYCLATE 100 MG PO TABS
100.0000 mg | ORAL_TABLET | Freq: Two times a day (BID) | ORAL | Status: DC
  Administered 2016-03-10 – 2016-03-12 (×5): 100 mg via ORAL
  Filled 2016-03-10 (×8): qty 1

## 2016-03-10 MED ORDER — MAGNESIUM HYDROXIDE 400 MG/5ML PO SUSP: 30.0000 mL | Freq: Every day | ORAL | Status: DC | PRN

## 2016-03-10 MED ORDER — DICYCLOMINE HCL 20 MG PO TABS: 20.0000 mg | ORAL_TABLET | Freq: Four times a day (QID) | ORAL | Status: DC | PRN

## 2016-03-10 MED ORDER — LOPERAMIDE HCL 2 MG PO CAPS: 2.0000 mg | ORAL_CAPSULE | ORAL | Status: DC | PRN

## 2016-03-10 MED ORDER — ATENOLOL 25 MG PO TABS
25.0000 mg | ORAL_TABLET | Freq: Every day | ORAL | Status: DC
  Administered 2016-03-10 – 2016-03-12 (×3): 25 mg via ORAL
  Filled 2016-03-10 (×5): qty 1

## 2016-03-10 MED ORDER — ALUM & MAG HYDROXIDE-SIMETH 200-200-20 MG/5ML PO SUSP: 30.0000 mL | ORAL | Status: DC | PRN

## 2016-03-10 MED ORDER — CLONIDINE HCL 0.1 MG PO TABS
0.1000 mg | ORAL_TABLET | ORAL | Status: DC
  Administered 2016-03-12: 0.1 mg via ORAL
  Filled 2016-03-10 (×3): qty 1

## 2016-03-10 MED ORDER — CLONIDINE HCL 0.1 MG PO TABS
0.1000 mg | ORAL_TABLET | Freq: Four times a day (QID) | ORAL | Status: AC
  Administered 2016-03-10 – 2016-03-11 (×7): 0.1 mg via ORAL
  Filled 2016-03-10 (×8): qty 1

## 2016-03-10 MED ORDER — FLUTICASONE PROPIONATE HFA 110 MCG/ACT IN AERO
1.0000 | INHALATION_SPRAY | Freq: Two times a day (BID) | RESPIRATORY_TRACT | Status: DC
  Administered 2016-03-10 – 2016-03-12 (×3): 1 via RESPIRATORY_TRACT
  Filled 2016-03-10: qty 12

## 2016-03-10 MED ORDER — CLONIDINE HCL 0.1 MG PO TABS: 0.1000 mg | ORAL_TABLET | Freq: Every day | ORAL | Status: DC

## 2016-03-10 MED ORDER — ARIPIPRAZOLE 5 MG PO TABS
5.0000 mg | ORAL_TABLET | Freq: Every day | ORAL | Status: DC
  Administered 2016-03-10 – 2016-03-11 (×2): 5 mg via ORAL
  Filled 2016-03-10 (×5): qty 1

## 2016-03-10 NOTE — [facility] Group Notes (Signed)
[name] Group Notes: (Clinical Social Work)   03/10/2016      Type of Therapy:  Group Therapy   Participation Level:  Did Not Attend despite MHT prompting   Ambrose MantleMareida Grossman-Orr, LCSW 03/10/2016, 12:42 PM
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
## 2016-03-10 NOTE — [facility] Group Notes (Signed)
[name] Group Notes:  (Nursing/MHT/Case Management/Adjunct)  Date:  03/10/2016  Time:  10:32 AM  Type of Therapy:  Psychoeducational Skills  Participation Level:  Did Not Attend  Participation Quality:  Did Not Attend  Affect:  Did Not Attend  Cognitive:  Did Not Attend  Insight:  None  Engagement in Group:  Did Not Attend  Modes of Intervention:  Did Not Attend  Summary of Progress/Problems: Pt did not attend patient self inventory group.   Jacquelyne BalintForrest, Jessica Checketts Shanta 03/10/2016, 10:32 AM

## 2016-03-10 NOTE — Progress Notes (Signed)
Patient ID: Laura CockayneChristy D Taormina, female   DOB: 07-18-1985, 30 y.o.   MRN: [id number]   Pt currently presents with a flat, sad affect and cooperative, guarded behavior. Pt reports to writer that their goal is to "go somewhere else with my kids when I leave here." Pt reports hopelessness about getting into a shelter post discharge, states "I have tried calling." Pt reports good sleep with current medication regimen.   Pt provided with medications per providers orders. Pt's labs and vitals were monitored throughout the night. Pt supported emotionally and encouraged to express concerns and questions. Pt educated on medications. Pt given a list of resources on women's shelters and other housing options.   Pt's safety ensured with 15 minute and environmental checks. Pt currently denies SI/HI and A/V hallucinations. Pt verbally agrees to seek staff if SI/HI or A/VH occurs and to consult with staff before acting on any harmful thoughts. Will continue POC.
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
## 2016-03-10 NOTE — ED Notes (Signed)
SBAR Report received from previous nurse. Pt received calm and visible on unit. Pt denies current SI/ HI, A/V H, depression, anxiety, and pain at this time, and is otherwise stable. Pt reminded of camera surveillance, q 15 min rounds, and rules of the milieu. Pt screened for contraband by writer, will continue to assess. 

## 2016-03-10 NOTE — Progress Notes (Signed)
Adult Psychoeducational Group Note  Date:  03/10/2016 Time:  2045  Group Topic/Focus:  wrap up group  Participation Level:  Active  Participation Quality:  Appropriate, Attentive, Sharing and Supportive  Affect:  Appropriate  Cognitive:  Appropriate  Insight: Improving  Engagement in Group:  Engaged  Modes of Intervention:  Clarification, Education and Support  Additional Comments:  Pt reports plans to move out of state with her 258 and 32 year old.   Marcille BuffyMcNeil, Dennis Hegeman S 03/10/2016, 9:29 PM
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
## 2016-03-10 NOTE — Progress Notes (Signed)
D: Patient's self inventory sheet: patient has poor sleep, did not recieve sleep medication.poor  Appetite, low energy level, poor concentration. Rated depression 10/10, hopeless 8/10, anxiety 10/10. SI/HI/AVH: denies currently. Physical complaints are pain in back and legs, headaches. Goal is "going home". Pt admitted in the early morning and has been in bed most of this shift. Up to eat lunch tray that was brought back for her.   A: Medications administered, assessed medication knowledge and education given on medication regimen.  Emotional support and encouragement given patient. R: Denies current SI and HI , contracts for safety. Safety maintained with 15 minute checks.

## 2016-03-10 NOTE — ED Notes (Signed)
Pt escorted off unit by [employer]
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
## 2016-03-11 MED ORDER — LORAZEPAM 0.5 MG PO TABS
ORAL_TABLET | ORAL | Status: AC
  Administered 2016-03-11: 1 mg via ORAL
  Filled 2016-03-11: qty 2

## 2016-03-11 MED ORDER — LORAZEPAM 1 MG PO TABS
1.0000 mg | ORAL_TABLET | Freq: Once | ORAL | Status: AC
Start: 2016-03-11 — End: 2016-03-11
  Administered 2016-03-11: 1 mg via ORAL

## 2016-03-11 MED ORDER — LORAZEPAM 1 MG PO TABS
1.0000 mg | ORAL_TABLET | Freq: Four times a day (QID) | ORAL | Status: DC | PRN
  Administered 2016-03-12: 1 mg via ORAL
  Filled 2016-03-11 (×2): qty 1

## 2016-03-11 MED ORDER — QUETIAPINE FUMARATE 50 MG PO TABS
50.0000 mg | ORAL_TABLET | Freq: Every day | ORAL | Status: DC
  Administered 2016-03-11 – 2016-03-12 (×2): 50 mg via ORAL
  Filled 2016-03-11 (×5): qty 1

## 2016-03-11 MED ORDER — HALOPERIDOL LACTATE 5 MG/ML IJ SOLN: 5.0000 mg | Freq: Once | INTRAMUSCULAR | Status: DC | PRN

## 2016-03-11 MED ORDER — QUETIAPINE FUMARATE 50 MG PO TABS
50.0000 mg | ORAL_TABLET | Freq: Every day | ORAL | Status: DC
  Administered 2016-03-11: 50 mg via ORAL
  Filled 2016-03-11 (×3): qty 1

## 2016-03-11 MED ORDER — NICOTINE POLACRILEX 2 MG MT GUM
2.0000 mg | CHEWING_GUM | OROMUCOSAL | Status: DC | PRN
  Administered 2016-03-11 – 2016-03-12 (×3): 2 mg via ORAL
  Filled 2016-03-11: qty 1
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
## 2016-03-11 NOTE — Progress Notes (Signed)
Patient did attend the evening speaker AA meeting.  
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
## 2016-03-11 NOTE — [facility] Group Notes (Signed)
[name] Group Notes: (Clinical Social Work)   03/11/2016      Type of Therapy:  Group Therapy   Participation Level:  Did Not Attend despite MHT prompting   Ambrose MantleMareida Grossman-Orr, LCSW 03/11/2016, 2:50 PM

## 2016-03-12 LAB — PREGNANCY, URINE: Preg Test, Ur: NEGATIVE

## 2016-03-12 LAB — PROLACTIN: Prolactin: 17.7 ng/mL (ref 4.8–23.3)

## 2016-03-12 LAB — HEMOGLOBIN A1C
HEMOGLOBIN A1C: 5.4 % (ref 4.8–5.6)
Mean Plasma Glucose: 108 mg/dL

## 2016-03-12 MED ORDER — QUETIAPINE FUMARATE 50 MG PO TABS: 50.0000 mg | ORAL_TABLET | Freq: Every day | ORAL | 0 refills | Status: DC

## 2016-03-12 MED ORDER — LORAZEPAM 1 MG PO TABS
1.0000 mg | ORAL_TABLET | Freq: Once | ORAL | Status: AC
  Administered 2016-03-12: 1 mg via ORAL

## 2016-03-12 MED ORDER — METHOCARBAMOL 500 MG PO TABS: 500.0000 mg | ORAL_TABLET | Freq: Three times a day (TID) | ORAL | 0 refills | Status: DC | PRN

## 2016-03-12 MED ORDER — GABAPENTIN 300 MG PO CAPS: 300.0000 mg | ORAL_CAPSULE | Freq: Every day | ORAL | 0 refills | Status: DC

## 2016-03-12 MED ORDER — NICOTINE POLACRILEX 2 MG MT GUM: 2.0000 mg | CHEWING_GUM | OROMUCOSAL | 0 refills | Status: DC | PRN

## 2016-03-12 MED ORDER — HYDROXYZINE HCL 25 MG PO TABS: 25.0000 mg | ORAL_TABLET | Freq: Four times a day (QID) | ORAL | 0 refills | Status: DC | PRN

## 2016-03-12 MED ORDER — DOXYCYCLINE HYCLATE 100 MG PO TABS: 100.0000 mg | ORAL_TABLET | Freq: Two times a day (BID) | ORAL | 0 refills | Status: DC

## 2016-03-12 MED ORDER — FLUTICASONE PROPIONATE HFA 110 MCG/ACT IN AERO: 1.0000 | INHALATION_SPRAY | Freq: Two times a day (BID) | RESPIRATORY_TRACT | 12 refills | Status: DC

## 2016-03-12 NOTE — Plan of Care (Signed)
Problem: Coping: Goal: Ability to cope will improve Outcome: Progressing Pt able to attend group despite current grief

## 2016-03-12 NOTE — [facility] Suicide Risk Assessment (Signed)
[name] INPATIENT:  Family/Significant Other Suicide Prevention Education  Suicide Prevention Education:  Patient Refusal for Family/Significant Other Suicide Prevention Education: The patient Laura CockayneChristy D [name] has refused to provide written consent for family/significant other to be provided Family/Significant Other Suicide Prevention Education during admission and/or prior to discharge.  Physician notified. SPE reviewed with patient and brochure provided. Patient encouraged to return to hospital if having suicidal thoughts, patient verbalized his/her understanding and has no further questions at this time.   [name] L [name] 03/12/2016, 9:59 AM
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
## 2016-03-12 NOTE — Progress Notes (Signed)
Follow up care with pt on unit in response to referral by RN and weekend chaplain   Provided brief crisis support around grief and loss.  Pt's s/o, [place] of [place].  Pt supported by weekend chaplain when pt's mother communicated news. Today, she is anxious to be discharged, hoping to make appointment at funeral home at 11:30.    [name] reported her children (8,12) know of death of father.  She has not spoken to them yet and is anticipating seeing them at funeral home.  She is also anticipating seeing deceased for first time.  Chaplain provided emotional support around immediate grief and loss.  [name] expressed concern that her 32 year old had not emoted around father's death.  Chaplain provided brief education around grief and children, normalizing response.  Provided empathic presence and support around Alt[place]risty supporting her children, crisis support took form of brief planning for [name] to find support outside of hospital after discharge.   [name] remains anxious for discharge.    Belva CromeStalnaker, Harshika Mago Wayne MDiv

## 2016-03-12 NOTE — [facility] Counselor (Signed)
No PSA completed as patient discharged prior to 72 hours.  Lorena Clearman, LCSW Clinical Social Worker [facility] [phone]  

## 2016-03-12 NOTE — Progress Notes (Signed)
  [facility] :  Will you be returning to the same living situation after discharge:  Yes,  patient plans to return home with mother At discharge, do you have transportation home?: Yes,  patient's mother will transport Do you have the ability to pay for your medications: Yes,  patient will be provided with prescriptions at discharge  Release of information consent forms completed and in the chart;  Patient's signature needed at discharge.  Patient to Follow up at: Follow-up [facility] .   Specialty:  Professional Counselor Why:  Please go to walk-in clinic within 7 days of discharge Monday-Friday at 8am for assessment for therapy and medication management services.  Contact information: [facility] [address] [place] [address] [phone]           Next level of care provider has [facility] to [facility] Link:no  Safety Planning and Suicide Prevention discussed: Yes,  with patient  Have you used any form of tobacco in the last 30 days? (Cigarettes, Smokeless Tobacco, Cigars, and/or Pipes): Yes  Has patient been referred to the Quitline?: Patient refused referral  Patient has been referred for addiction treatment: Yes  Lana Flaim L Chantavia Bazzle 03/12/2016, 10:02 AM
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
## 2016-03-12 NOTE — Progress Notes (Signed)
Recreation Therapy Notes  Date: 03/12/16  Time: 0930 Location: 300 Hall Dayroom  Group Topic: Stress Management  Goal Area(s) Addresses:  Patient will verbalize importance of using healthy stress management.  Patient will identify positive emotions associated with healthy stress management.   Intervention: Guided Imagery  Activity :  Peaceful Place Imagery.  LRT introduced to the technique of guided imagery to patients.  LRT read a script so patients could participate in the technique.  Patients were to follow along as LRT read script.  Education:  Stress Management, Discharge Planning.   Education Outcome: Acknowledges edcuation/In group clarification offered/Needs additional education  Clinical Observations/Feedback: Pt did not attend group.      Onetha Gaffey, LRT/CTRS         Sonyia Muro A 03/12/2016 11:53 AM 

## 2016-03-12 NOTE — Progress Notes (Signed)
Patient ID: [name], female   DOB: Sep 06, 1985, 30 y.o.   MRN: [id number] Discharge note:  Patient discharged home per MD order.  Patient received all personal belongings from unit and locker.  Reviewed AVS/transitiion record with patient.  She received information on grief counseling and met with the chaplain this morning.  Patient denies any thoughts of self harm or AVH/HI.  Patient will follow up with [facility].  She received her prescriptions and indicated understanding of medications.  She left ambulatory with her family.

## 2016-03-12 NOTE — Tx Team (Signed)
Interdisciplinary Treatment and Diagnostic Plan Update  03/12/2016 Time of Session: 9:30am [name] MRN: [id number]  Principal Diagnosis: Psychoactive substance-induced mood disorder (HCC)  Secondary Diagnoses: Principal Problem:   Psychoactive substance-induced mood disorder (HCC)   Current Medications:  Current Facility-Administered Medications  Medication Dose Route Frequency Provider Last Rate Last Dose  . albuterol (PROVENTIL HFA;VENTOLIN HFA) 108 (90 Base) MCG/ACT inhaler 2 puff  2 puff Inhalation Q6H PRN Kerry Hough, PA-C      . alum & mag hydroxide-simeth (MAALOX/MYLANTA) 200-200-20 MG/5ML suspension 30 mL  30 mL Oral Q4H PRN Kerry Hough, PA-C      . atenolol (TENORMIN) tablet 25 mg  25 mg Oral Daily Kerry Hough, PA-C   25 mg at 03/11/16 0909  . cloNIDine (CATAPRES) tablet 0.1 mg  0.1 mg Oral BH-qamhs Neysa Hotter, MD       Followed by  . [START ON 03/14/2016] cloNIDine (CATAPRES) tablet 0.1 mg  0.1 mg Oral QAC breakfast Neysa Hotter, MD      . dicyclomine (BENTYL) tablet 20 mg  20 mg Oral Q6H PRN Neysa Hotter, MD      . doxycycline (VIBRA-TABS) tablet 100 mg  100 mg Oral Q12H Kerry Hough, PA-C   100 mg at 03/11/16 1938  . fluticasone (FLOVENT HFA) 110 MCG/ACT inhaler 1 puff  1 puff Inhalation BID Neysa Hotter, MD   1 puff at 03/10/16 1948  . gabapentin (NEURONTIN) capsule 300 mg  300 mg Oral QHS Neysa Hotter, MD   300 mg at 03/11/16 1938  . haloperidol lactate (HALDOL) injection 5 mg  5 mg Intramuscular Once PRN Craige Cotta, MD      . hydrOXYzine (ATARAX/VISTARIL) tablet 25 mg  25 mg Oral Q6H PRN Kerry Hough, PA-C   25 mg at 03/11/16 1938  . loperamide (IMODIUM) capsule 2-4 mg  2-4 mg Oral PRN Neysa Hotter, MD      . LORazepam (ATIVAN) tablet 1 mg  1 mg Oral Q6H PRN Craige Cotta, MD   1 mg at 03/12/16 0630  . magnesium hydroxide (MILK OF MAGNESIA) suspension 30 mL  30 mL Oral Daily PRN Kerry Hough, PA-C      . methocarbamol (ROBAXIN) tablet  500 mg  500 mg Oral Q8H PRN Neysa Hotter, MD   500 mg at 03/11/16 1305  . naproxen (NAPROSYN) tablet 500 mg  500 mg Oral BID PRN Neysa Hotter, MD   500 mg at 03/11/16 1736  . nicotine polacrilex (NICORETTE) gum 2 mg  2 mg Oral PRN Craige Cotta, MD   2 mg at 03/12/16 0631  . ondansetron (ZOFRAN-ODT) disintegrating tablet 4 mg  4 mg Oral Q6H PRN Neysa Hotter, MD      . QUEtiapine (SEROQUEL) tablet 50 mg  50 mg Oral Daily Neysa Hotter, MD   50 mg at 03/11/16 1626  . QUEtiapine (SEROQUEL) tablet 50 mg  50 mg Oral QHS Neysa Hotter, MD   50 mg at 03/11/16 2244  . traZODone (DESYREL) tablet 50 mg  50 mg Oral QHS,MR X 1 Spencer E Simon, PA-C   50 mg at 03/11/16 2244   PTA Medications: Prescriptions Prior to Admission  Medication Sig Dispense Refill Last Dose  . atenolol (TENORMIN) 25 MG tablet Take 1 tablet (25 mg total) by mouth daily. 14 tablet 0 03/09/2016 at 0630  . DULoxetine (CYMBALTA) 20 MG capsule Take 1 capsule (20 mg total) by mouth daily. (Patient not taking: Reported on 03/09/2016)  30 capsule 0 Not Taking at Unknown time  . metroNIDAZOLE (FLAGYL) 500 MG tablet Take 1 tablet (500 mg total) by mouth 2 (two) times daily. (Patient not taking: Reported on 03/09/2016) 20 tablet 0 Not Taking at Unknown time  . Multiple Vitamin (MULTIVITAMIN WITH MINERALS) TABS tablet Take 1 tablet by mouth daily.   03/09/2016 at Unknown time  . omeprazole (PRILOSEC) 20 MG capsule Take 1 capsule (20 mg total) by mouth daily. (Patient not taking: Reported on 03/09/2016) 30 capsule 0 Not Taking at Unknown time  . ondansetron (ZOFRAN ODT) 8 MG disintegrating tablet Take 1 tablet (8 mg total) by mouth every 8 (eight) hours as needed for nausea or vomiting. (Patient not taking: Reported on 08/26/2015) 10 tablet 0 Past Month at Unknown time  . ondansetron (ZOFRAN ODT) 8 MG disintegrating tablet Take 1 tablet (8 mg total) by mouth every 8 (eight) hours as needed for nausea or vomiting. (Patient not taking: Reported on 03/09/2016)  20 tablet 0 Not Taking at Unknown time  . oxyCODONE-acetaminophen (PERCOCET/ROXICET) 5-325 MG tablet Take 1 tablet by mouth every 4 (four) hours as needed for severe pain. (Patient not taking: Reported on 03/09/2016) 10 tablet 0 Not Taking at Unknown time  . promethazine (PHENERGAN) 25 MG tablet Take 1 tablet (25 mg total) by mouth every 8 (eight) hours as needed for nausea or vomiting. (Patient not taking: Reported on 03/09/2016) 15 tablet 0 Not Taking at Unknown time    Patient Stressors: Financial difficulties Marital or family conflict Medication change or noncompliance Substance abuse  Patient Strengths: Barrister's clerkCommunication skills Motivation for treatment/growth  Treatment Modalities: Medication Management, Group therapy, Case management,  1 to 1 session with clinician, Psychoeducation, Recreational therapy.   Physician Treatment Plan for Primary Diagnosis: Psychoactive substance-induced mood disorder (HCC) Long Term Goal(s): Improvement in symptoms so as ready for discharge Improvement in symptoms so as ready for discharge   Short Term Goals: Ability to identify changes in lifestyle to reduce recurrence of condition will improve Ability to identify and develop effective coping behaviors will improve Ability to identify changes in lifestyle to reduce recurrence of condition will improve Ability to demonstrate self-control will improve Ability to identify and develop effective coping behaviors will improve  Medication Management: Evaluate patient's response, side effects, and tolerance of medication regimen.  Therapeutic Interventions: 1 to 1 sessions, Unit Group sessions and Medication administration.  Evaluation of Outcomes: Adequate for Discharge  Physician Treatment Plan for Secondary Diagnosis: Principal Problem:   Psychoactive substance-induced mood disorder (HCC)  Long Term Goal(s): Improvement in symptoms so as ready for discharge Improvement in symptoms so as ready for  discharge   Short Term Goals: Ability to identify changes in lifestyle to reduce recurrence of condition will improve Ability to identify and develop effective coping behaviors will improve Ability to identify changes in lifestyle to reduce recurrence of condition will improve Ability to demonstrate self-control will improve Ability to identify and develop effective coping behaviors will improve     Medication Management: Evaluate patient's response, side effects, and tolerance of medication regimen.  Therapeutic Interventions: 1 to 1 sessions, Unit Group sessions and Medication administration.  Evaluation of Outcomes: Adequate for Discharge   RN Treatment Plan for Primary Diagnosis: Psychoactive substance-induced mood disorder (HCC) Long Term Goal(s): Knowledge of disease and therapeutic regimen to maintain health will improve  Short Term Goals: Ability to remain free from injury will improve, Ability to disclose and discuss suicidal ideas, Ability to identify and develop effective coping behaviors will  improve and Compliance with prescribed medications will improve  Medication Management: RN will administer medications as ordered by provider, will assess and evaluate patient's response and provide education to patient for prescribed medication. RN will report any adverse and/or side effects to prescribing provider.  Therapeutic Interventions: 1 on 1 counseling sessions, Psychoeducation, Medication administration, Evaluate responses to treatment, Monitor vital signs and CBGs as ordered, Perform/monitor CIWA, COWS, AIMS and Fall Risk screenings as ordered, Perform wound care treatments as ordered.  Evaluation of Outcomes: Adequate for Discharge   LCSW Treatment Plan for Primary Diagnosis: Psychoactive substance-induced mood disorder (HCC) Long Term Goal(s): Safe transition to appropriate next level of care at discharge, Engage patient in therapeutic group addressing interpersonal  concerns.  Short Term Goals: Engage patient in aftercare planning with referrals and resources, Increase social support, Increase emotional regulation, Identify triggers associated with mental health/substance abuse issues and Increase skills for wellness and recovery  Therapeutic Interventions: Assess for all discharge needs, 1 to 1 time with Social worker, Explore available resources and support systems, Assess for adequacy in community support network, Educate family and significant other(s) on suicide prevention, Complete Psychosocial Assessment, Interpersonal group therapy.  Evaluation of Outcomes: Adequate for Discharge   Progress in Treatment :  Attending groups: No  Participating in groups: No  Taking medication as prescribed: Yes, MD continuing to assess for appropriate medication regimen  Toleration medication: Yes  Family/Significant other contact made: No, patient has declined for collateral contact   Patient understands diagnosis: Yes  Discussing patient identified problems/goals with staff: Yes  Medical problems stabilized or resolved: Yes  Denies suicidal/homicidal ideation: Yes, denies  Issues/concerns per patient self-inventory: None reported  Other: N/A  New problem(s) identified: None reported at this time    New Short Term/Long Term Goal(s): None at this time    Discharge Plan or Barriers: Patient plans to return home to follow up with outpatient services    Reason for Continuation of Hospitalization: Anxiety Depression Medication stabilization Withdrawal symptoms  Estimated Length of Stay: Discharge anticipated for today 03/12/16    Attendees:  Patient:   Physician: Dr. Mckinley Jewel , MD  03/12/2016   9:30am  Nursing: Jaynie Collins, RN 03/12/2016 9:30am  RN Care Manager: Onnie Boer, CM  03/12/2016 9:30am  Social Workers:  Belenda Cruise Cedric Mcclaine, LCSW   03/12/2016 9:30am  Nurse Pratictioners: Gray Bernhardt, NP, Hillery Jacks, NP  03/12/2016 9:30am  Other:  03/12/2016 9:30am   Scribe for Treatment Team: Samuella Bruin, LCSW Clinical Social Worker [facility] [phone]

## 2016-07-15 ENCOUNTER — Emergency Department (HOSPITAL_COMMUNITY)
Admission: EM | Admit: 2016-07-15 | Discharge: 2016-07-16 | Disposition: A | Discharge status: Home / Self Care | Payer: No Typology Code available for payment source | Attending: Emergency Medicine | Admitting: Emergency Medicine

## 2016-07-15 ENCOUNTER — Encounter (HOSPITAL_COMMUNITY): Payer: Self-pay | Admitting: *Deleted

## 2016-07-15 ENCOUNTER — Encounter (HOSPITAL_COMMUNITY): Payer: Self-pay | Admitting: Behavioral Health

## 2016-07-15 ENCOUNTER — Ambulatory Visit (HOSPITAL_COMMUNITY)
Admission: RE | Admit: 2016-07-15 | Discharge: 2016-07-15 | Disposition: A | Discharge status: Home / Self Care | Payer: Medicaid Other | Attending: Psychiatry | Admitting: Psychiatry

## 2016-07-15 DIAGNOSIS — F1193 Opioid use, unspecified with withdrawal: Secondary | ICD-10-CM

## 2016-07-15 DIAGNOSIS — R45851 Suicidal ideations: Secondary | ICD-10-CM | POA: Diagnosis not present

## 2016-07-15 DIAGNOSIS — F1920 Other psychoactive substance dependence, uncomplicated: Secondary | ICD-10-CM | POA: Diagnosis present

## 2016-07-15 DIAGNOSIS — F1123 Opioid dependence with withdrawal: Secondary | ICD-10-CM | POA: Insufficient documentation

## 2016-07-15 DIAGNOSIS — F17210 Nicotine dependence, cigarettes, uncomplicated: Secondary | ICD-10-CM | POA: Diagnosis not present

## 2016-07-15 DIAGNOSIS — Z79899 Other long term (current) drug therapy: Secondary | ICD-10-CM | POA: Insufficient documentation

## 2016-07-15 DIAGNOSIS — F3289 Other specified depressive episodes: Secondary | ICD-10-CM | POA: Diagnosis not present

## 2016-07-15 DIAGNOSIS — F1910 Other psychoactive substance abuse, uncomplicated: Secondary | ICD-10-CM

## 2016-07-15 DIAGNOSIS — E119 Type 2 diabetes mellitus without complications: Secondary | ICD-10-CM | POA: Diagnosis not present

## 2016-07-15 DIAGNOSIS — F1210 Cannabis abuse, uncomplicated: Secondary | ICD-10-CM | POA: Insufficient documentation

## 2016-07-15 LAB — COMPREHENSIVE METABOLIC PANEL
ALT: 14 U/L (ref 14–54)
AST: 17 U/L (ref 15–41)
Albumin: 3.6 g/dL (ref 3.5–5.0)
Alkaline Phosphatase: 55 U/L (ref 38–126)
Anion gap: 11 (ref 5–15)
BILIRUBIN TOTAL: 0.7 mg/dL (ref 0.3–1.2)
BUN: 8 mg/dL (ref 6–20)
CALCIUM: 9.1 mg/dL (ref 8.9–10.3)
CO2: 22 mmol/L (ref 22–32)
CREATININE: 0.94 mg/dL (ref 0.44–1.00)
Chloride: 103 mmol/L (ref 101–111)
GFR calc Af Amer: >60 mL/min (ref >60)
Glucose, Bld: 118 mg/dL — ABNORMAL HIGH (ref 65–99)
Potassium: 3.6 mmol/L (ref 3.5–5.1)
Sodium: 136 mmol/L (ref 135–145)
TOTAL PROTEIN: 8.1 g/dL (ref 6.5–8.1)

## 2016-07-15 LAB — CBC
HCT: 40.5 % (ref 36.0–46.0)
Hemoglobin: 13 g/dL (ref 12.0–15.0)
MCH: 24.6 pg — AB (ref 26.0–34.0)
MCHC: 32.1 g/dL (ref 30.0–36.0)
MCV: 76.7 fL — AB (ref 78.0–100.0)
PLATELETS: 323 10*3/uL (ref 150–400)
RBC: 5.28 MIL/uL — ABNORMAL HIGH (ref 3.87–5.11)
RDW: 16.3 % — ABNORMAL HIGH (ref 11.5–15.5)
WBC: 10.2 10*3/uL (ref 4.0–10.5)

## 2016-07-15 LAB — ACETAMINOPHEN LEVEL: Acetaminophen (Tylenol), Serum: <10 ug/mL — ABNORMAL LOW (ref 10–30)

## 2016-07-15 LAB — RAPID URINE DRUG SCREEN, HOSP PERFORMED
AMPHETAMINES: NOT DETECTED
Barbiturates: NOT DETECTED
Benzodiazepines: NOT DETECTED
Cocaine: POSITIVE — AB
Opiates: NOT DETECTED
Tetrahydrocannabinol: POSITIVE — AB

## 2016-07-15 LAB — I-STAT BETA HCG BLOOD, ED (MC, WL, AP ONLY)

## 2016-07-15 LAB — SALICYLATE LEVEL: Salicylate Lvl: 7.5 mg/dL (ref 2.8–30.0)

## 2016-07-15 LAB — ETHANOL

## 2016-07-15 MED ORDER — NAPROXEN 250 MG PO TABS
500.0000 mg | ORAL_TABLET | Freq: Two times a day (BID) | ORAL | Status: DC | PRN
  Administered 2016-07-15: 500 mg via ORAL
  Filled 2016-07-15: qty 2

## 2016-07-15 MED ORDER — ATENOLOL 25 MG PO TABS
25.0000 mg | ORAL_TABLET | Freq: Every day | ORAL | Status: DC
  Administered 2016-07-15: 25 mg via ORAL
  Filled 2016-07-15: qty 1

## 2016-07-15 MED ORDER — ONDANSETRON HCL 4 MG PO TABS: 4.0000 mg | ORAL_TABLET | Freq: Three times a day (TID) | ORAL | Status: DC | PRN

## 2016-07-15 MED ORDER — ZOLPIDEM TARTRATE 5 MG PO TABS: 5.0000 mg | ORAL_TABLET | Freq: Every evening | ORAL | Status: DC | PRN

## 2016-07-15 MED ORDER — DICYCLOMINE HCL 20 MG PO TABS
20.0000 mg | ORAL_TABLET | Freq: Four times a day (QID) | ORAL | Status: DC | PRN
  Administered 2016-07-15: 20 mg via ORAL
  Filled 2016-07-15: qty 1

## 2016-07-15 MED ORDER — CLONIDINE HCL 0.2 MG PO TABS: 0.1000 mg | ORAL_TABLET | Freq: Every day | ORAL | Status: DC

## 2016-07-15 MED ORDER — HYDROXYZINE HCL 25 MG PO TABS
25.0000 mg | ORAL_TABLET | Freq: Four times a day (QID) | ORAL | Status: DC | PRN
  Administered 2016-07-15 – 2016-07-16 (×3): 25 mg via ORAL
  Filled 2016-07-15 (×3): qty 1

## 2016-07-15 MED ORDER — NICOTINE 21 MG/24HR TD PT24
21.0000 mg | MEDICATED_PATCH | Freq: Every day | TRANSDERMAL | Status: DC
  Administered 2016-07-15 – 2016-07-16 (×2): 21 mg via TRANSDERMAL
  Filled 2016-07-15 (×2): qty 1

## 2016-07-15 MED ORDER — LOPERAMIDE HCL 2 MG PO CAPS: 2.0000 mg | ORAL_CAPSULE | ORAL | Status: DC | PRN

## 2016-07-15 MED ORDER — ONDANSETRON 4 MG PO TBDP: 4.0000 mg | ORAL_TABLET | Freq: Four times a day (QID) | ORAL | Status: DC | PRN

## 2016-07-15 MED ORDER — ALUM & MAG HYDROXIDE-SIMETH 200-200-20 MG/5ML PO SUSP: 30.0000 mL | ORAL | Status: DC | PRN

## 2016-07-15 MED ORDER — QUETIAPINE FUMARATE 25 MG PO TABS
50.0000 mg | ORAL_TABLET | Freq: Every day | ORAL | Status: DC
  Administered 2016-07-15: 50 mg via ORAL
  Filled 2016-07-15: qty 2

## 2016-07-15 MED ORDER — IBUPROFEN 400 MG PO TABS
600.0000 mg | ORAL_TABLET | Freq: Three times a day (TID) | ORAL | Status: DC | PRN
  Administered 2016-07-16: 600 mg via ORAL
  Filled 2016-07-15: qty 1

## 2016-07-15 MED ORDER — CLONIDINE HCL 0.2 MG PO TABS: 0.1000 mg | ORAL_TABLET | ORAL | Status: DC

## 2016-07-15 MED ORDER — METHOCARBAMOL 500 MG PO TABS
500.0000 mg | ORAL_TABLET | Freq: Three times a day (TID) | ORAL | Status: DC | PRN
  Administered 2016-07-15: 500 mg via ORAL
  Filled 2016-07-15 (×2): qty 1

## 2016-07-15 MED ORDER — GABAPENTIN 300 MG PO CAPS
300.0000 mg | ORAL_CAPSULE | Freq: Every day | ORAL | Status: DC
  Administered 2016-07-15: 300 mg via ORAL
  Filled 2016-07-15: qty 1

## 2016-07-15 MED ORDER — CLONIDINE HCL 0.2 MG PO TABS
0.1000 mg | ORAL_TABLET | Freq: Four times a day (QID) | ORAL | Status: DC
  Administered 2016-07-15: 0.1 mg via ORAL
  Filled 2016-07-15: qty 1

## 2016-07-15 NOTE — BH Assessment (Signed)
[name] confirmed adult unit is currently at capacity. Faxed clinical information to the following facilities for placement:  [facility]   [address][name] Laura Patsy BaltimoreWarrick [name], [name], [facility], [facility] Triage Specialist [phone]

## 2016-07-15 NOTE — ED Notes (Signed)
Pt's belongings - 2 labeled bags and 1 personal green bag - placed at nurses' desk for inventory by Elnora MorrisonJ Crews, RN.

## 2016-07-15 NOTE — ED Triage Notes (Signed)
Pt reports being suicidal, "does not want to live anymore." denies making any attempts to harm herself recently. Has hx of same but reports not taking her meds as prescribed. Calm and cooperative at triage.
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
## 2016-07-15 NOTE — ED Notes (Signed)
Pt verbalized understanding of and signed Medical Clearance Pt Policy form - copy given to pt and original placed on clipboard.

## 2016-07-15 NOTE — ED Notes (Signed)
[id number] Pt noted to be wearing burgundy scrubs.

## 2016-07-15 NOTE — ED Notes (Signed)
Pt in scrubs at triage, paged security to wand pt and called to request a sitter

## 2016-07-15 NOTE — ED Notes (Signed)
Pt came to desk to make a phone call and was found to be wearing a hard plastic headband. Headband was removed and placed in locked cabinet in pts room.

## 2016-07-15 NOTE — ED Notes (Signed)
Snack and drink delivered to pt. Room 

## 2016-07-15 NOTE — ED Provider Notes (Signed)
[facility]-EMERGENCY DEPT Provider Note   CSN: [id number] Arrival date & time: 07/15/16  1220     History   Chief Complaint Chief Complaint  Patient presents with  . Suicidal     HPI   Blood pressure (!) 133/102, pulse (!) 55, temperature 97.8 F (36.6 C), temperature source Oral, resp. rate 18, height 5\' 7"  (1.702 m), weight (!) 143.8 kg, SpO2 100 %.  ALESA ECHEVARRIA is a 31 y.o. female leading to the ED with suicidal ideation, vague without a plan. She reports that she is withdrawing from IV heroin and IV cocaine (she just started injecting the cocaine last week. She last used on Friday (2 days ago and states that she is going through withdrawals. She states that she wants to quit she's not just not using B she can't get any of her preferred drugs. She doesn't have a history of alcohol abuse or alcohol withdrawal. She states that her fianc died in a car accident 4 months ago and she's been depressed since then, she does have prior suicide attempt with overdose on heroin in the past. She denies any auditory or visual hallucinations, homicidal ideation. Patient denies any medical issues however chart shows that she is diabetic and has hep C. He states that she has children that live with her sister and she is homeless and does not have a safer warm place to stay.  Past Medical History:  Diagnosis Date  . Bipolar 1 disorder, depressed (HCC)   . Cocaine abuse   . Depression   . Diabetes mellitus without complication (HCC)   . Hep C w/o coma, chronic (HCC)   . Kidney stone   . Migraine   . Obesity   . Pregnancy induced hypertension   . PTSD (post-traumatic stress disorder)   . Substance abuse   . Urinary tract infection   . UTI (lower urinary tract infection)     Patient Active Problem List   Diagnosis Date Noted  . Psychoactive substance-induced mood disorder (HCC) 03/10/2016  . 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 Cocaine dependence (HCC) 12/10/2014  . Polysubstance  dependence (HCC) 12/10/2014  . Suicidal ideations 12/10/2014  . PTSD (post-traumatic stress disorder) 12/10/2014  . Anemia 03/08/2012  . Metrorrhagia 03/06/2012  . Dysmenorrhea 03/06/2012    Past Surgical History:  Procedure Laterality Date  . kidney stones    . lipotripsy      OB History    Gravida Para Term Preterm AB Living   3 2 2   1 2    SAB TAB Ectopic Multiple Live Births   1               Home Medications    Prior to Admission medications   Medication Sig Start Date End Date Taking? Authorizing Provider  cloNIDine (CATAPRES) 0.1 MG tablet Take 0.1 mg by mouth daily. 04/25/16  Yes Historical Provider, MD  gabapentin (NEURONTIN) 300 MG capsule Take 1 capsule (300 mg total) by mouth at bedtime. Patient taking differently: Take 600 mg by mouth 3 (three) times daily.  03/12/16  Yes Oneta Rack, NP  SUBOXONE 8-2 MG FILM Place 1 Film under the tongue 3 (three) times daily. 06/08/16  Yes Historical Provider, MD  nicotine polacrilex (NICORETTE) 2 MG gum Take 1 each (2 mg total) by mouth as needed for smoking cessation. Patient not taking: Reported on 07/15/2016 03/12/16   Oneta Rack, NP    Family History Family History  Problem Relation Age of Onset  .  Rheum arthritis Mother   . Asthma Mother   . Hypertension Mother   . CAD Other   . Other Neg Hx     Social History Social History  Substance Use Topics  . Smoking status: Current Every Day Smoker    Packs/day: 1.00    Types: Cigarettes  . Smokeless tobacco: Never Used  . Alcohol use No     Comment: denied any alcohol use     Allergies   Acetaminophen; Darvocet [propoxyphene n-acetaminophen]; Morphine and related; Sulfa antibiotics; Tomato; and Pioglitazone   Review of Systems Review of Systems  10 systems reviewed and found to be negative, except as noted in the HPI.  Physical Exam Updated Vital Signs BP (!) 133/102 (BP Location: Left Arm)   Pulse (!) 55   Temp 97.8 F (36.6 C) (Oral)   Resp 18    Ht 5\' 7"  (1.702 m)   Wt (!) 143.8 kg   SpO2 100%   BMI 49.65 kg/m   Physical Exam  Constitutional: She is oriented to person, place, and time. She appears well-developed and well-nourished. No distress.  HENT:  Head: Normocephalic and atraumatic.  Mouth/Throat: Oropharynx is clear and moist.  Eyes: Conjunctivae and EOM are normal. Pupils are equal, round, and reactive to light.  Neck: Normal range of motion.  Cardiovascular: Normal rate, regular rhythm and intact distal pulses.   Pulmonary/Chest: Effort normal and breath sounds normal.  Abdominal: Soft. There is no tenderness.  Musculoskeletal: Normal range of motion.  Neurological: She is alert and oriented to person, place, and time.  Skin: She is not diaphoretic.  Psychiatric:  Tearful, +SI w/o plan  Nursing note and vitals reviewed.    ED Treatments / Results  Labs (all labs ordered are listed, but only abnormal results are displayed) Labs Reviewed  COMPREHENSIVE METABOLIC PANEL - Abnormal; Notable for the following:       Result Value   Glucose, Bld 118 (*)    All other components within normal limits  ACETAMINOPHEN LEVEL - Abnormal; Notable for the following:    Acetaminophen (Tylenol), Serum <10 (*)    All other components within normal limits  CBC - Abnormal; Notable for the following:    RBC 5.28 (*)    MCV 76.7 (*)    MCH 24.6 (*)    RDW 16.3 (*)    All other components within normal limits  RAPID URINE DRUG SCREEN, HOSP PERFORMED - Abnormal; Notable for the following:    Cocaine POSITIVE (*)    Tetrahydrocannabinol POSITIVE (*)    All other components within normal limits  ETHANOL  SALICYLATE LEVEL  I-STAT BETA HCG BLOOD, ED ([facility], [facility], [facility] ONLY)    EKG  EKG Interpretation None       Radiology No results found.  Procedures Procedures (including critical care time)  Medications Ordered in ED Medications  atenolol (TENORMIN) tablet 25 mg (not administered)  gabapentin (NEURONTIN) capsule 300  mg (not administered)  hydrOXYzine (ATARAX/VISTARIL) tablet 25 mg (not administered)  methocarbamol (ROBAXIN) tablet 500 mg (not administered)  QUEtiapine (SEROQUEL) tablet 50 mg (not administered)  dicyclomine (BENTYL) tablet 20 mg (not administered)  loperamide (IMODIUM) capsule 2-4 mg (not administered)  naproxen (NAPROSYN) tablet 500 mg (not administered)  ondansetron (ZOFRAN-ODT) disintegrating tablet 4 mg (not administered)  cloNIDine (CATAPRES) tablet 0.1 mg (not administered)    Followed by  cloNIDine (CATAPRES) tablet 0.1 mg (not administered)    Followed by  cloNIDine (CATAPRES) tablet 0.1 mg (not administered)  alum &  mag hydroxide-simeth (MAALOX/MYLANTA) 200-200-20 MG/5ML suspension 30 mL (not administered)  ondansetron (ZOFRAN) tablet 4 mg (not administered)  nicotine (NICODERM CQ - dosed in mg/24 hours) patch 21 mg (not administered)  zolpidem (AMBIEN) tablet 5 mg (not administered)  ibuprofen (ADVIL,MOTRIN) tablet 600 mg (not administered)     Initial Impression / Assessment and Plan / ED Course  I have reviewed the triage vital signs and the nursing notes.  Pertinent labs & imaging results that were available during my care of the patient were reviewed by me and considered in my medical decision making (see chart for details).    Vitals:   07/15/16 1225  BP: (!) 133/102  Pulse: (!) 55  Resp: 18  Temp: 97.8 F (36.6 C)  TempSrc: Oral  SpO2: 100%  Weight: (!) 143.8 kg  Height: 5\' 7"  (1.702 m)    Medications  atenolol (TENORMIN) tablet 25 mg (not administered)  gabapentin (NEURONTIN) capsule 300 mg (not administered)  hydrOXYzine (ATARAX/VISTARIL) tablet 25 mg (not administered)  methocarbamol (ROBAXIN) tablet 500 mg (not administered)  QUEtiapine (SEROQUEL) tablet 50 mg (not administered)  dicyclomine (BENTYL) tablet 20 mg (not administered)  loperamide (IMODIUM) capsule 2-4 mg (not administered)  naproxen (NAPROSYN) tablet 500 mg (not administered)    ondansetron (ZOFRAN-ODT) disintegrating tablet 4 mg (not administered)  cloNIDine (CATAPRES) tablet 0.1 mg (not administered)    Followed by  cloNIDine (CATAPRES) tablet 0.1 mg (not administered)    Followed by  cloNIDine (CATAPRES) tablet 0.1 mg (not administered)  alum & mag hydroxide-simeth (MAALOX/MYLANTA) 200-200-20 MG/5ML suspension 30 mL (not administered)  ondansetron (ZOFRAN) tablet 4 mg (not administered)  nicotine (NICODERM CQ - dosed in mg/24 hours) patch 21 mg (not administered)  zolpidem (AMBIEN) tablet 5 mg (not administered)  ibuprofen (ADVIL,MOTRIN) tablet 600 mg (not administered)    Amanda CockayneChristy D Lizarraga is [age] y.o. female presenting with Suicidal ideation, no plan. She does have a prior suicide attempt. She is also withdrawing from IV heroin and IV cocaine, states she wants to stop using both. She has a history in her chart of diabetes however I don't see that she's taking any medication and her glucose on her chemistries not grossly abnormal. Will hold off on starting sliding scale order set.  Patient is initiated on opiate withdrawal protocol.   Patient is medically cleared for psychiatric evaluation will be transferred to the psych ED. TTS consulted, home meds and psych standard holding orders placed.   Final Clinical Impressions(s) / ED Diagnoses   Final diagnoses:  Opiate withdrawal (HCC)  Suicidal ideation    New Prescriptions New Prescriptions   No medications on file     Wynetta Emeryicole Paarth Cropper, PA-C 07/15/16 1508    Jacalyn LefevreJulie Haviland, MD 07/16/16 [phone]

## 2016-07-15 NOTE — H&P (Signed)
Behavioral Health Medical Screening Exam  [name] is an 31 y.o. female.  Total Time spent with patient: 15 minutes  Psychiatric Specialty Exam: Physical Exam  Constitutional: She is oriented to person, place, and time. She appears well-developed and well-nourished.  HENT:  Head: Normocephalic and atraumatic.  Neck: Normal range of motion.  Cardiovascular: Normal rate and normal heart sounds.   Respiratory: Effort normal and breath sounds normal.  GI: Soft. Bowel sounds are normal.  Musculoskeletal: Normal range of motion.  Neurological: She is alert and oriented to person, place, and time.  Skin: Skin is warm and dry.    Review of Systems  Psychiatric/Behavioral: Positive for depression, substance abuse and suicidal ideas. Negative for hallucinations and memory loss. The patient is not nervous/anxious and does not have insomnia.     Blood pressure (!) 95/56, pulse (!) 53, temperature 98.3 F (36.8 C), temperature source Oral, resp. rate 16, SpO2 92 %.There is no height or weight on file to calculate BMI.  General Appearance: Casual and Fairly Groomed  Eye Contact:  Good  Speech:  Clear and Coherent and Normal Rate  Volume:  Normal  Mood:  Anxious, Depressed, Dysphoric, Hopeless and Worthless  Affect:  Appropriate, Congruent, Depressed, Flat and Tearful  Thought Process:  Coherent, Goal Directed and Linear  Orientation:  Full (Time, Place, and Person)  Thought Content:  Logical  Suicidal Thoughts:  Yes.  with intent/plan  Homicidal Thoughts:  No  Memory:  Immediate;   Good Recent;   Fair Remote;   Fair  Judgement:  Fair  Insight:  Fair  Psychomotor Activity:  Normal  Concentration: Concentration: Good and Attention Span: Good  Recall:  Good  Fund of Knowledge:Good  Language: Good  Akathisia:  No  Handed:  Right  AIMS (if indicated):     Assets:  Communication Skills Desire for Improvement Financial Resources/Insurance Housing Resilience Social  Support Transportation Vocational/Educational  Sleep:       Musculoskeletal: Strength & Muscle Tone: within normal limits Gait & Station: normal Patient leans: N/A  Blood pressure (!) 95/56, pulse (!) 53, temperature 98.3 F (36.8 C), temperature source Oral, resp. rate 16, SpO2 92 %.  Recommendations:  Based on my evaluation the patient does not appear to have an emergency medical condition.  Pt meets criteria for inpatient psychiatric admission when medically cleared.   Laveda AbbeLaurie Britton Zelta Enfield, NP 07/15/2016, 11:39 AM

## 2016-07-15 NOTE — ED Notes (Signed)
Lying on bed watching tv. Pt noted to be calm, no jerking movements noted. When pt saw RN, pt began shaking legs and huffing - requesting additional meds for withdrawals. Advised pt unable to administer Clonidine d/t systolic BP less than 100.  Pt advised she is allergic to Tylenol.

## 2016-07-16 DIAGNOSIS — R45851 Suicidal ideations: Secondary | ICD-10-CM | POA: Diagnosis not present

## 2016-07-16 DIAGNOSIS — F1193 Opioid use, unspecified with withdrawal: Secondary | ICD-10-CM | POA: Insufficient documentation

## 2016-07-16 DIAGNOSIS — F1123 Opioid dependence with withdrawal: Secondary | ICD-10-CM

## 2016-07-16 NOTE — ED Notes (Signed)
Declined W/C at D/C and was escorted to lobby by RN. 

## 2016-07-16 NOTE — ED Triage Notes (Signed)
[facility] Pt reported her ride home goes to work at [employer] .

## 2016-07-16 NOTE — ED Notes (Addendum)
Patient was given a snack and drink, and A regular diet taken for Lunch.

## 2016-07-16 NOTE — Discharge Instructions (Signed)
Return to the ED with any concerns including increased thought of killing yourself or harming someone else, or any other alarming symptoms

## 2016-07-16 NOTE — Consult Note (Signed)
Telepsych Consultation   Reason for Consult:  Suicidal ideation Referring Physician:  EDP Patient Identification: [name] MRN:  [id number] Principal Diagnosis: Suicidal ideations  Diagnosis:   Patient Active Problem List   Diagnosis Date Noted  . Opiate withdrawal ([place]) [F11.23]   . Psychoactive substance-induced mood disorder ([place]) [I09.73, F06.30] 03/10/2016  . Cocaine-induced mood disorder ([place]) [F14.94] 06/17/2015  . Cocaine dependence ([place]) [F14.20] 12/10/2014  . Polysubstance dependence ([place]) [F19.20] 12/10/2014  . Suicidal ideations [R45.851] 12/10/2014  . PTSD (post-traumatic stress disorder) [F43.10] 12/10/2014  . Anemia [D64.9] 03/08/2012  . Metrorrhagia [N92.1] 03/06/2012  . Dysmenorrhea [N94.6] 03/06/2012    Total Time spent with patient: 30 minutes  Subjective:   [name] is a 31 y.o. female patient admitted with suicidal ideation with a plan to overdose.   HPI:  [name] is a 31 year old female who first presented to [facility] as a walk-in asking for inpatient help with her drug addiction and depression. She was sent to [facility] for medical clearance. Pt was re-assessed via telephone this morning. The tele psych machine at [facility] is not working at this time. Pt was calm and cooperative, alert & oriented x 3 and answered all questions coherently.  Pt denies suicidal/homicidal ideation, denies auditory/visual hallucinations. Pt stated she was having a very rough day and felt like she wanted to die. Pt denies feeling this way today. Pt stated she feels safe to go home. Pt requested outpatient resources so she can get back on her medications and stay clean. Pt also has a significant stressor with the death of her boyfriend 4 months ago and has been depressed since then. Pt has one prior suicide attempt by overdose.   Discussed case with Dr Dwyane Dee who recommends that Pt be discharged home with outpatient resources.   Past Psychiatric History: Polysubstance abuse,  suicidal ideation, PTSD,   Risk to Self: Is patient at risk for suicide?: Yes Risk to Others:  No Prior Inpatient Therapy:  Yes Prior Outpatient Therapy:  Yes  Past Medical History:  Past Medical History:  Diagnosis Date  . Bipolar 1 disorder, depressed ([place])   . Cocaine abuse   . Depression   . Diabetes mellitus without complication ([place])   . Hep C w/o coma, chronic ([place])   . Kidney stone   . Migraine   . Obesity   . Pregnancy induced hypertension   . PTSD (post-traumatic stress disorder)   . Substance abuse   . Urinary tract infection   . UTI (lower urinary tract infection)     Past Surgical History:  Procedure Laterality Date  . kidney stones    . lipotripsy     Family History:  Family History  Problem Relation Age of Onset  . Rheum arthritis Mother   . Asthma Mother   . Hypertension Mother   . CAD Other   . Other Neg Hx    Family Psychiatric  History: Unknown Social History:  History  Alcohol Use No    Comment: denied any alcohol use     History  Drug Use  . Types: Cocaine, Marijuana, IV, Heroin    Comment: Pt endorsed 1 gram of heroin and cocaine on 07/13/16    Social History   Social History  . Marital status: Legally Separated    Spouse name: N/A  . Number of children: N/A  . Years of education: N/A   Social History Main Topics  . Smoking status: Current Every Day Smoker  Packs/day: 1.00    Types: Cigarettes  . Smokeless tobacco: Never Used  . Alcohol use No     Comment: denied any alcohol use  . Drug use: Yes    Types: Cocaine, Marijuana, IV, Heroin     Comment: Pt endorsed 1 gram of heroin and cocaine on 07/13/16  . Sexual activity: Yes    Birth control/ protection: Injection   Other Topics Concern  . None   Social History Narrative  . None   Additional Social History:    Allergies:   Allergies  Allergen Reactions  . Acetaminophen Nausea And Vomiting and Anaphylaxis    Has tolerated percocet in the past  . Darvocet  [Propoxyphene N-Acetaminophen] Hives and Nausea And Vomiting  . Morphine And Related Itching    Needs benadryl prior to  administration-tolerates, yet pt OK with Tramadol  . Sulfa Antibiotics Hives    Yet OK with Glimepiride   . Tomato Hives  . Pioglitazone Swelling    Swelling of hands, legs and feet     Labs:  Results for orders placed or performed during the hospital encounter of 07/15/16 (from the past 48 hour(s))  Comprehensive metabolic panel     Status: Abnormal   Collection Time: 07/15/16 12:32 PM  Result Value Ref Range   Sodium 136 135 - 145 mmol/L   Potassium 3.6 3.5 - 5.1 mmol/L   Chloride 103 101 - 111 mmol/L   CO2 22 22 - 32 mmol/L   Glucose, Bld 118 (H) 65 - 99 mg/dL   BUN 8 6 - 20 mg/dL   Creatinine, Ser 0.94 0.44 - 1.00 mg/dL   Calcium 9.1 8.9 - 10.3 mg/dL   Total Protein 8.1 6.5 - 8.1 g/dL   Albumin 3.6 3.5 - 5.0 g/dL   AST 17 15 - 41 U/L   ALT 14 14 - 54 U/L   Alkaline Phosphatase 55 38 - 126 U/L   Total Bilirubin 0.7 0.3 - 1.2 mg/dL   GFR calc non Af Amer >60 >60 mL/min   GFR calc Af Amer >60 >60 mL/min    Comment: (NOTE) The eGFR has been calculated using the CKD EPI equation. This calculation has not been validated in all clinical situations. eGFR's persistently <60 mL/min signify possible Chronic Kidney Disease.    Anion gap 11 5 - 15  Ethanol     Status: None   Collection Time: 07/15/16 12:32 PM  Result Value Ref Range   Alcohol, Ethyl (B) <5 <5 mg/dL    Comment:        LOWEST DETECTABLE LIMIT FOR SERUM ALCOHOL IS 5 mg/dL FOR MEDICAL PURPOSES ONLY   Salicylate level     Status: None   Collection Time: 07/15/16 12:32 PM  Result Value Ref Range   Salicylate Lvl 7.5 2.8 - 30.0 mg/dL  Acetaminophen level     Status: Abnormal   Collection Time: 07/15/16 12:32 PM  Result Value Ref Range   Acetaminophen (Tylenol), Serum <10 (L) 10 - 30 ug/mL    Comment:        THERAPEUTIC CONCENTRATIONS VARY SIGNIFICANTLY. A RANGE OF 10-30 ug/mL MAY BE AN  EFFECTIVE CONCENTRATION FOR MANY PATIENTS. HOWEVER, SOME ARE BEST TREATED AT CONCENTRATIONS OUTSIDE THIS RANGE. ACETAMINOPHEN CONCENTRATIONS >150 ug/mL AT 4 HOURS AFTER INGESTION AND >50 ug/mL AT 12 HOURS AFTER INGESTION ARE OFTEN ASSOCIATED WITH TOXIC REACTIONS.   cbc     Status: Abnormal   Collection Time: 07/15/16 12:32 PM  Result Value Ref Range  WBC 10.2 4.0 - 10.5 K/uL   RBC 5.28 (H) 3.87 - 5.11 MIL/uL   Hemoglobin 13.0 12.0 - 15.0 g/dL   HCT 40.5 36.0 - 46.0 %   MCV 76.7 (L) 78.0 - 100.0 fL   MCH 24.6 (L) 26.0 - 34.0 pg   MCHC 32.1 30.0 - 36.0 g/dL   RDW 16.3 (H) 11.5 - 15.5 %   Platelets 323 150 - 400 K/uL  I-Stat beta hCG blood, ED     Status: None   Collection Time: 07/15/16 12:46 PM  Result Value Ref Range   I-stat hCG, quantitative <5.0 <5 mIU/mL   Comment 3            Comment:   GEST. AGE      CONC.  (mIU/mL)   <=1 WEEK        5 - 50     2 WEEKS       50 - 500     3 WEEKS       100 - 10,000     4 WEEKS     1,000 - 30,000        FEMALE AND NON-PREGNANT FEMALE:     LESS THAN 5 mIU/mL   Rapid urine drug screen (hospital performed)     Status: Abnormal   Collection Time: 07/15/16  2:45 PM  Result Value Ref Range   Opiates NONE DETECTED NONE DETECTED   Cocaine POSITIVE (A) NONE DETECTED   Benzodiazepines NONE DETECTED NONE DETECTED   Amphetamines NONE DETECTED NONE DETECTED   Tetrahydrocannabinol POSITIVE (A) NONE DETECTED   Barbiturates NONE DETECTED NONE DETECTED    Comment:        DRUG SCREEN FOR MEDICAL PURPOSES ONLY.  IF CONFIRMATION IS NEEDED FOR ANY PURPOSE, NOTIFY LAB WITHIN 5 DAYS.        LOWEST DETECTABLE LIMITS FOR URINE DRUG SCREEN Drug Class       Cutoff (ng/mL) Amphetamine      1000 Barbiturate      200 Benzodiazepine   211 Tricyclics       941 Opiates          300 Cocaine          300 THC              50     Current Facility-Administered Medications  Medication Dose Route Frequency Provider Last Rate Last Dose  . alum & mag  hydroxide-simeth (MAALOX/MYLANTA) 200-200-20 MG/5ML suspension 30 mL  30 mL Oral PRN Nicole Pisciotta, PA-C      . atenolol (TENORMIN) tablet 25 mg  25 mg Oral Daily Nicole Pisciotta, PA-C   25 mg at 07/15/16 1514  . cloNIDine (CATAPRES) tablet 0.1 mg  0.1 mg Oral QID Nicole Pisciotta, PA-C   0.1 mg at 07/15/16 2146   Followed by  . [START ON 07/17/2016] cloNIDine (CATAPRES) tablet 0.1 mg  0.1 mg Oral BH-qamhs Nicole Pisciotta, PA-C       Followed by  . [START ON 07/20/2016] cloNIDine (CATAPRES) tablet 0.1 mg  0.1 mg Oral QAC breakfast Nicole Pisciotta, PA-C      . dicyclomine (BENTYL) tablet 20 mg  20 mg Oral Q6H PRN Nicole Pisciotta, PA-C   20 mg at 07/15/16 1613  . gabapentin (NEURONTIN) capsule 300 mg  300 mg Oral QHS Nicole Pisciotta, PA-C   300 mg at 07/15/16 2145  . hydrOXYzine (ATARAX/VISTARIL) tablet 25 mg  25 mg Oral Q6H PRN Nicole Pisciotta, PA-C   25 mg  at 07/15/16 2340  . ibuprofen (ADVIL,MOTRIN) tablet 600 mg  600 mg Oral Q8H PRN Nicole Pisciotta, PA-C      . loperamide (IMODIUM) capsule 2-4 mg  2-4 mg Oral PRN Nicole Pisciotta, PA-C      . methocarbamol (ROBAXIN) tablet 500 mg  500 mg Oral Q8H PRN Nicole Pisciotta, PA-C   500 mg at 07/15/16 1613  . naproxen (NAPROSYN) tablet 500 mg  500 mg Oral BID PRN Nicole Pisciotta, PA-C   500 mg at 07/15/16 1613  . nicotine (NICODERM CQ - dosed in mg/24 hours) patch 21 mg  21 mg Transdermal Daily Nicole Pisciotta, PA-C   21 mg at 07/15/16 1613  . ondansetron (ZOFRAN) tablet 4 mg  4 mg Oral Q8H PRN Nicole Pisciotta, PA-C      . ondansetron (ZOFRAN-ODT) disintegrating tablet 4 mg  4 mg Oral Q6H PRN Nicole Pisciotta, PA-C      . QUEtiapine (SEROQUEL) tablet 50 mg  50 mg Oral QHS Nicole Pisciotta, PA-C   50 mg at 07/15/16 2340  . zolpidem (AMBIEN) tablet 5 mg  5 mg Oral QHS PRN Monico Blitz, PA-C       Current Outpatient Prescriptions  Medication Sig Dispense Refill  . cloNIDine (CATAPRES) 0.1 MG tablet Take 0.1 mg by mouth daily.  5  .  gabapentin (NEURONTIN) 300 MG capsule Take 1 capsule (300 mg total) by mouth at bedtime. (Patient taking differently: Take 600 mg by mouth 3 (three) times daily. ) 30 capsule 0  . SUBOXONE 8-2 MG FILM Place 1 Film under the tongue 3 (three) times daily.  0  . nicotine polacrilex (NICORETTE) 2 MG gum Take 1 each (2 mg total) by mouth as needed for smoking cessation. (Patient not taking: Reported on 07/15/2016) 100 tablet 0    Musculoskeletal: Unable to assess: tele psych camera not working  Psychiatric Specialty Exam: Physical Exam  Review of Systems  Psychiatric/Behavioral: Positive for depression, substance abuse and suicidal ideas. Negative for hallucinations and memory loss. The patient is not nervous/anxious and does not have insomnia.   All other systems reviewed and are negative.   Blood pressure 93/57, pulse (!) 53, temperature 97.9 F (36.6 C), temperature source Oral, resp. rate 18, height '5\' 7"'  (1.702 m), weight (!) 143.8 kg (317 lb), SpO2 100 %.Body mass index is 49.65 kg/m.  General Appearance: Unable to assess: tele psych machine is not working today  Engineer, water:  Unable to assess: tele psych machine is not working today  Speech:  Clear and Coherent and Normal Rate  Volume:  Normal  Mood:  Depressed  Affect:  Congruent  Thought Process:  Coherent, Goal Directed and Linear  Orientation:  Full (Time, Place, and Person)  Thought Content:  Logical  Suicidal Thoughts:  No  Homicidal Thoughts:  No  Memory:  Immediate;   Good Recent;   Good Remote;   Fair  Judgement:  Good  Insight:  Good and Fair  Psychomotor Activity:  Unable to assess: tele psych machine is not working today  Concentration:  Concentration: Good and Attention Span: Good  Recall:  Good  Fund of Knowledge:  Good  Language:  Good  Akathisia:  Unable to assess: tele psych machine is not working today  Handed:  Right  AIMS (if indicated):     Assets:  Armed forces logistics/support/administrative officer Desire for Improvement Financial  Resources/Insurance Housing Resilience Social Support Transportation  ADL's:  Intact  Cognition:  WNL  Sleep:  Treatment Plan Summary: Discharge home  Provide outpatient resources for therapy/psychiatry and medication management Activity as tolerated Stay well hydrated Eat a nutritious and balanced diet  Disposition: No evidence of imminent risk to self or others at present.   Patient does not meet criteria for psychiatric inpatient admission. Supportive therapy provided about ongoing stressors. Discussed crisis plan, support from social network, calling 911, coming to the Emergency Department, and calling Suicide Hotline.  Ethelene Hal, NP 07/16/2016 9:35 AM

## 2016-07-23 ENCOUNTER — Emergency Department (HOSPITAL_COMMUNITY)
Admission: EM | Admit: 2016-07-23 | Discharge: 2016-07-23 | Disposition: A | Discharge status: Home / Self Care | Payer: No Typology Code available for payment source | Attending: Emergency Medicine | Admitting: Emergency Medicine

## 2016-07-23 ENCOUNTER — Encounter (HOSPITAL_COMMUNITY): Payer: Self-pay | Admitting: Emergency Medicine

## 2016-07-23 DIAGNOSIS — B001 Herpesviral vesicular dermatitis: Secondary | ICD-10-CM | POA: Diagnosis not present

## 2016-07-23 DIAGNOSIS — F17210 Nicotine dependence, cigarettes, uncomplicated: Secondary | ICD-10-CM | POA: Diagnosis not present

## 2016-07-23 DIAGNOSIS — K120 Recurrent oral aphthae: Secondary | ICD-10-CM | POA: Insufficient documentation

## 2016-07-23 DIAGNOSIS — E119 Type 2 diabetes mellitus without complications: Secondary | ICD-10-CM | POA: Diagnosis not present

## 2016-07-23 DIAGNOSIS — K0889 Other specified disorders of teeth and supporting structures: Secondary | ICD-10-CM | POA: Diagnosis present

## 2016-07-23 DIAGNOSIS — K029 Dental caries, unspecified: Secondary | ICD-10-CM | POA: Insufficient documentation

## 2016-07-23 MED ORDER — VALACYCLOVIR HCL 1 G PO TABS: 500.0000 mg | ORAL_TABLET | Freq: Two times a day (BID) | ORAL | 0 refills | Status: AC

## 2016-07-23 MED ORDER — MAGIC MOUTHWASH W/LIDOCAINE: 5.0000 mL | Freq: Four times a day (QID) | ORAL | 0 refills | Status: DC | PRN

## 2016-07-23 MED ORDER — PENICILLIN V POTASSIUM 500 MG PO TABS: 500.0000 mg | ORAL_TABLET | Freq: Four times a day (QID) | ORAL | 0 refills | Status: DC

## 2016-07-23 NOTE — ED Triage Notes (Signed)
Patient states that she broke a tooth and been having issues with it but having trouble getting in to see dentist and has an apt tomorrow. Patient states that she noticed bumps around mouth and spots on tongue that came up on Friday.

## 2016-07-23 NOTE — Discharge Instructions (Addendum)
Take antibiotic with food Follow up with dentist Take Valtrex for cold sores

## 2016-07-23 NOTE — ED Provider Notes (Signed)
[facility]-EMERGENCY DEPT Provider Note   CSN: [id number] Arrival date & time: 07/23/16  1046  By signing my name below, I, Modena Jansky, attest that this documentation has been prepared under the direction and in the presence of non-physician practitioner, Terance Hart, PA-C. Electronically Signed: Modena Jansky, Scribe. 07/23/2016. 12:30 PM.  History   Chief Complaint Chief Complaint  Patient presents with  . Dental Pain  . Rash   The history is provided by the patient. No language interpreter was used.   HPI Comments: [name] is a 31 y.o. female who presents to the Emergency Department complaining of constant moderate dental pain that started about 2 weeks ago. She states she broke a tooth and has a dental appointment in 2 days. She has been using orajel with minimal relief. She reports associated fever (Tmax:102, onset last night). Pt's temperature in the ED today was 98.1. She denies any difficulty swallowing or facial swelling. She also notes she has had cold sores and ulcers in her mouth which have been painful as well.   PCP: Jearld Lesch, MD  Past Medical History:  Diagnosis Date  . Bipolar 1 disorder, depressed ([facility])   . Cocaine abuse   . Depression   . Diabetes mellitus without complication ([facility])   . Hep C w/o coma, chronic ([facility])   . Kidney stone   . Migraine   . Obesity   . Pregnancy induced hypertension   . PTSD (post-traumatic stress disorder)   . Substance abuse   . Urinary tract infection   . UTI (lower urinary tract infection)     Patient Active Problem List   Diagnosis Date Noted  . Opiate withdrawal ([facility])   . 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 Cocaine dependence ([facility]) 12/10/2014  . Polysubstance dependence ([facility]) 12/10/2014  . Suicidal ideations 12/10/2014  . PTSD (post-traumatic stress disorder) 12/10/2014  . Anemia 03/08/2012  . Metrorrhagia 03/06/2012  . Dysmenorrhea 03/06/2012    Past Surgical History:  Procedure  Laterality Date  . kidney stones    . lipotripsy      OB History    Gravida Para Term Preterm AB Living   3 2 2   1 2    SAB TAB Ectopic Multiple Live Births   1               Home Medications    Prior to Admission medications   Medication Sig Start Date End Date Taking? Authorizing Provider  cloNIDine (CATAPRES) 0.1 MG tablet Take 0.1 mg by mouth daily. 04/25/16   Historical Provider, MD  gabapentin (NEURONTIN) 300 MG capsule Take 1 capsule (300 mg total) by mouth at bedtime. Patient taking differently: Take 600 mg by mouth 3 (three) times daily.  03/12/16   Oneta Rack, NP  nicotine polacrilex (NICORETTE) 2 MG gum Take 1 each (2 mg total) by mouth as needed for smoking cessation. Patient not taking: Reported on 07/15/2016 03/12/16   Oneta Rack, NP  SUBOXONE 8-2 MG FILM Place 1 Film under the tongue 3 (three) times daily. 06/08/16   Historical Provider, MD    Family History Family History  Problem Relation Age of Onset  . Rheum arthritis Mother   . Asthma Mother   . Hypertension Mother   . CAD Other   . Other Neg Hx     Social History Social History  Substance Use Topics  . Smoking status: Current Every Day Smoker    Packs/day: 1.00    Types:  Cigarettes  . Smokeless tobacco: Never Used  . Alcohol use No     Comment: denied any alcohol use     Allergies   Acetaminophen; Darvocet [propoxyphene n-acetaminophen]; Morphine and related; Sulfa antibiotics; Tomato; and Pioglitazone   Review of Systems Review of Systems  Constitutional: Positive for fever (Tmax: 102).  HENT: Positive for dental problem and mouth sores. Negative for facial swelling and trouble swallowing.        +cold sores     Physical Exam Updated Vital Signs BP (!) 140/102 (BP Location: Left Arm)   Pulse 92   Temp 98.1 F (36.7 C) (Oral)   Resp 18   LMP 07/08/2016   SpO2 100%   Physical Exam  Constitutional: She appears well-developed and well-nourished. No distress.  HENT:  Head:  Normocephalic.  Poor dentition. No obvious drain able abscess. No trismus. No swollen lymph nodes. Apthous ulcer in mouth and cold sore noted on left upper lip.   Eyes: Conjunctivae are normal.  Neck: Neck supple.  Cardiovascular: Normal rate and regular rhythm.   Pulmonary/Chest: Effort normal.  Abdominal: Soft.  Musculoskeletal: Normal range of motion.  Neurological: She is alert.  Skin: Skin is warm and dry.  Psychiatric: She has a normal mood and affect.  Nursing note and vitals reviewed.    ED Treatments / Results  DIAGNOSTIC STUDIES: Oxygen Saturation is 100% on RA, normal by my interpretation.    COORDINATION OF CARE: 12:34 PM- Pt advised of plan for treatment and pt agrees.  Labs (all labs ordered are listed, but only abnormal results are displayed) Labs Reviewed - No data to display  EKG  EKG Interpretation None       Radiology No results found.  Procedures Procedures (including critical care time)  Medications Ordered in ED Medications - No data to display   Initial Impression / Assessment and Plan / ED Course  I have reviewed the triage vital signs and the nursing notes.  Pertinent labs & imaging results that were available during my care of the patient were reviewed by me and considered in my medical decision making (see chart for details).  Patient with toothache.  No gross abscess.  Exam unconcerning for Ludwig's angina or spread of infection.  Will treat with penicillin and pain medicine. She has dental follow up in 2 days. Also will rx Valtrex and magic mouthwash.   Final Clinical Impressions(s) / ED Diagnoses   Final diagnoses:  Pain due to dental caries  Aphthous ulcer of mouth  Cold sore    New Prescriptions Discharge Medication List as of 07/23/2016 12:45 PM    START taking these medications   Details  magic mouthwash w/lidocaine SOLN Take 5 mLs by mouth 4 (four) times daily as needed for mouth pain. Swish, gargle, and spit every 6  hours as needed, Starting Mon 07/23/2016, Print    penicillin v potassium (VEETID) 500 MG tablet Take 1 tablet (500 mg total) by mouth 4 (four) times daily., Starting Mon 07/23/2016, Print    valACYclovir (VALTREX) 1000 MG tablet Take 0.5 tablets (500 mg total) by mouth 2 (two) times daily., Starting Mon 07/23/2016, Until Mon 08/06/2016, Print       I personally performed the services described in this documentation, which was scribed in my presence. The recorded information has been reviewed and is accurate.     Bethel BornKelly Marie Ugochi Henzler, PA-C 07/25/16 1610    Cathren LaineKevin Steinl, MD 07/25/16 [id number]

## 2016-11-07 ENCOUNTER — Emergency Department (HOSPITAL_COMMUNITY)
Admission: EM | Admit: 2016-11-07 | Discharge: 2016-11-08 | Disposition: A | Discharge status: Home / Self Care | Payer: Medicaid Other | Attending: Emergency Medicine | Admitting: Emergency Medicine

## 2016-11-07 ENCOUNTER — Encounter (HOSPITAL_COMMUNITY): Payer: Self-pay | Admitting: Nurse Practitioner

## 2016-11-07 DIAGNOSIS — F1210 Cannabis abuse, uncomplicated: Secondary | ICD-10-CM | POA: Insufficient documentation

## 2016-11-07 DIAGNOSIS — F17210 Nicotine dependence, cigarettes, uncomplicated: Secondary | ICD-10-CM | POA: Insufficient documentation

## 2016-11-07 DIAGNOSIS — F1510 Other stimulant abuse, uncomplicated: Secondary | ICD-10-CM | POA: Insufficient documentation

## 2016-11-07 DIAGNOSIS — F32A Depression, unspecified: Secondary | ICD-10-CM

## 2016-11-07 DIAGNOSIS — F4310 Post-traumatic stress disorder, unspecified: Secondary | ICD-10-CM | POA: Diagnosis present

## 2016-11-07 DIAGNOSIS — F1920 Other psychoactive substance dependence, uncomplicated: Secondary | ICD-10-CM

## 2016-11-07 DIAGNOSIS — Z79899 Other long term (current) drug therapy: Secondary | ICD-10-CM | POA: Insufficient documentation

## 2016-11-07 DIAGNOSIS — F1410 Cocaine abuse, uncomplicated: Secondary | ICD-10-CM | POA: Insufficient documentation

## 2016-11-07 DIAGNOSIS — F329 Major depressive disorder, single episode, unspecified: Secondary | ICD-10-CM | POA: Insufficient documentation

## 2016-11-07 DIAGNOSIS — E119 Type 2 diabetes mellitus without complications: Secondary | ICD-10-CM | POA: Insufficient documentation

## 2016-11-07 DIAGNOSIS — F314 Bipolar disorder, current episode depressed, severe, without psychotic features: Secondary | ICD-10-CM | POA: Diagnosis present

## 2016-11-07 DIAGNOSIS — F1910 Other psychoactive substance abuse, uncomplicated: Secondary | ICD-10-CM

## 2016-11-07 LAB — COMPREHENSIVE METABOLIC PANEL
ALT: 19 U/L (ref 14–54)
AST: 19 U/L (ref 15–41)
Albumin: 3.7 g/dL (ref 3.5–5.0)
Alkaline Phosphatase: 51 U/L (ref 38–126)
Anion gap: 9 (ref 5–15)
BUN: 7 mg/dL (ref 6–20)
CO2: 27 mmol/L (ref 22–32)
Calcium: 9.2 mg/dL (ref 8.9–10.3)
Chloride: 104 mmol/L (ref 101–111)
Creatinine, Ser: 0.89 mg/dL (ref 0.44–1.00)
GFR calc Af Amer: >60 mL/min (ref >60)
GFR calc non Af Amer: >60 mL/min (ref >60)
Glucose, Bld: 98 mg/dL (ref 65–99)
Potassium: 3.1 mmol/L — ABNORMAL LOW (ref 3.5–5.1)
Sodium: 140 mmol/L (ref 135–145)
Total Bilirubin: 0.4 mg/dL (ref 0.3–1.2)
Total Protein: 7.6 g/dL (ref 6.5–8.1)

## 2016-11-07 LAB — CBC
HCT: 40.8 % (ref 36.0–46.0)
Hemoglobin: 13.6 g/dL (ref 12.0–15.0)
MCH: 26.7 pg (ref 26.0–34.0)
MCHC: 33.3 g/dL (ref 30.0–36.0)
MCV: 80.2 fL (ref 78.0–100.0)
Platelets: 248 10*3/uL (ref 150–400)
RBC: 5.09 MIL/uL (ref 3.87–5.11)
RDW: 15.2 % (ref 11.5–15.5)
WBC: 7.9 10*3/uL (ref 4.0–10.5)

## 2016-11-07 LAB — POC URINE PREG, ED: Preg Test, Ur: NEGATIVE

## 2016-11-07 LAB — RAPID URINE DRUG SCREEN, HOSP PERFORMED
Amphetamines: POSITIVE — AB
Barbiturates: NOT DETECTED
Benzodiazepines: NOT DETECTED
Cocaine: POSITIVE — AB
Opiates: POSITIVE — AB
Tetrahydrocannabinol: POSITIVE — AB

## 2016-11-07 LAB — ETHANOL: Alcohol, Ethyl (B): <5 mg/dL (ref <5)

## 2016-11-07 LAB — SALICYLATE LEVEL: Salicylate Lvl: <7 mg/dL (ref 2.8–30.0)

## 2016-11-07 LAB — ACETAMINOPHEN LEVEL: Acetaminophen (Tylenol), Serum: <10 ug/mL — ABNORMAL LOW (ref 10–30)

## 2016-11-07 MED ORDER — NAPROXEN 500 MG PO TABS
500.0000 mg | ORAL_TABLET | Freq: Two times a day (BID) | ORAL | Status: DC | PRN
  Administered 2016-11-08 (×2): 500 mg via ORAL
  Filled 2016-11-07 (×2): qty 1

## 2016-11-07 MED ORDER — HYDROXYZINE HCL 25 MG PO TABS
25.0000 mg | ORAL_TABLET | Freq: Four times a day (QID) | ORAL | Status: DC | PRN
Start: 2016-11-07 — End: 2016-11-08
  Administered 2016-11-07 (×2): 25 mg via ORAL
  Filled 2016-11-07 (×2): qty 1

## 2016-11-07 MED ORDER — ONDANSETRON 4 MG PO TBDP: 4.0000 mg | ORAL_TABLET | Freq: Four times a day (QID) | ORAL | Status: DC | PRN

## 2016-11-07 MED ORDER — CLONIDINE HCL 0.1 MG PO TABS: 0.1000 mg | ORAL_TABLET | Freq: Every day | ORAL | Status: DC

## 2016-11-07 MED ORDER — CLONIDINE HCL 0.1 MG PO TABS
0.1000 mg | ORAL_TABLET | Freq: Four times a day (QID) | ORAL | Status: DC
  Administered 2016-11-07 – 2016-11-08 (×4): 0.1 mg via ORAL
  Filled 2016-11-07 (×4): qty 1

## 2016-11-07 MED ORDER — CLONIDINE HCL 0.1 MG PO TABS: 0.1000 mg | ORAL_TABLET | Freq: Two times a day (BID) | ORAL | Status: DC

## 2016-11-07 MED ORDER — LOPERAMIDE HCL 2 MG PO CAPS: 2.0000 mg | ORAL_CAPSULE | ORAL | Status: DC | PRN

## 2016-11-07 MED ORDER — NICOTINE 21 MG/24HR TD PT24
21.0000 mg | MEDICATED_PATCH | Freq: Every day | TRANSDERMAL | Status: DC
Start: 2016-11-07 — End: 2016-11-08
  Administered 2016-11-07 – 2016-11-08 (×3): 21 mg via TRANSDERMAL
  Filled 2016-11-07 (×2): qty 1

## 2016-11-07 MED ORDER — DICYCLOMINE HCL 20 MG PO TABS: 20.0000 mg | ORAL_TABLET | Freq: Four times a day (QID) | ORAL | Status: DC | PRN

## 2016-11-07 MED ORDER — METHOCARBAMOL 500 MG PO TABS
500.0000 mg | ORAL_TABLET | Freq: Three times a day (TID) | ORAL | Status: DC | PRN
  Administered 2016-11-07 – 2016-11-08 (×3): 500 mg via ORAL
  Filled 2016-11-07 (×3): qty 1

## 2016-11-07 MED ORDER — POTASSIUM CHLORIDE CRYS ER 20 MEQ PO TBCR
40.0000 meq | EXTENDED_RELEASE_TABLET | Freq: Once | ORAL | Status: AC
  Administered 2016-11-07: 40 meq via ORAL
  Filled 2016-11-07: qty 2

## 2016-11-07 NOTE — ED Notes (Signed)
Patient educated about search process and term "contraband " and routine search performed. No contraband found. 

## 2016-11-07 NOTE — ED Provider Notes (Signed)
[facility]-EMERGENCY DEPT Provider Note   CSN: [id number] Arrival date & time: 11/07/16  1428     History   Chief Complaint No chief complaint on file.   HPI [name] is a 31 y.o. female.  HPI   31yF with depression and SI. [facility] and also had stillborn that month. Lost her home a couple months ago and now her children are living with her sister. Increasingly depressed. Heroin and cocaine use. Thoughts of suicide but cannot give me clear plan. Denies HI or hallucinations.   Past Medical History:  Diagnosis Date  . Bipolar 1 disorder, depressed (HCC)   . Cocaine abuse   . Depression   . Diabetes mellitus without complication (HCC)   . Hep C w/o coma, chronic (HCC)   . Kidney stone   . Migraine   . Obesity   . Pregnancy induced hypertension   . PTSD (post-traumatic stress disorder)   . Substance abuse   . Urinary tract infection   . UTI (lower urinary tract infection)     Patient Active Problem List   Diagnosis Date Noted  . Opiate withdrawal (HCC)   . 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 Cocaine dependence (HCC) 12/10/2014  . Polysubstance dependence (HCC) 12/10/2014  . Suicidal ideations 12/10/2014  . PTSD (post-traumatic stress disorder) 12/10/2014  . Anemia 03/08/2012  . Metrorrhagia 03/06/2012  . Dysmenorrhea 03/06/2012    Past Surgical History:  Procedure Laterality Date  . kidney stones    . lipotripsy      OB History    Gravida Para Term Preterm AB Living   3 2 2   1 2    SAB TAB Ectopic Multiple Live Births   1               Home Medications    Prior to Admission medications   Medication Sig Start Date End Date Taking? Authorizing Provider  gabapentin (NEURONTIN) 300 MG capsule Take 1 capsule (300 mg total) by mouth at bedtime. Patient taking differently: Take 600 mg by mouth 3 (three) times daily.  03/12/16  Yes Oneta RackLewis, Tanika N, NP  SUBOXONE 8-2 MG FILM Place 1 Film under the tongue 3 (three)  times daily. 06/08/16  Yes [provider]  magic mouthwash w/lidocaine SOLN Take 5 mLs by mouth 4 (four) times daily as needed for mouth pain. Swish, gargle, and spit every 6 hours as needed Patient not taking: Reported on 11/07/2016 07/23/16   Bethel BornGekas, Kelly Marie, PA-C  nicotine polacrilex (NICORETTE) 2 MG gum Take 1 each (2 mg total) by mouth as needed for smoking cessation. Patient not taking: Reported on 07/15/2016 03/12/16   Oneta RackLewis, Tanika N, NP  penicillin v potassium (VEETID) 500 MG tablet Take 1 tablet (500 mg total) by mouth 4 (four) times daily. Patient not taking: Reported on 11/07/2016 07/23/16   Bethel BornGekas, Kelly Marie, PA-C    Family History Family History  Problem Relation Age of Onset  . Rheum arthritis Mother   . Asthma Mother   . Hypertension Mother   . CAD Other   . Other Neg Hx     Social History Social History  Substance Use Topics  . Smoking status: Current Every Day Smoker    Packs/day: 1.00    Types: Cigarettes  . Smokeless tobacco: Never Used  . Alcohol use No     Comment: denied any alcohol use     Allergies   Acetaminophen; Darvocet [propoxyphene n-acetaminophen]; Morphine  and related; Sulfa antibiotics; Tomato; and Pioglitazone   Review of Systems Review of Systems  All systems reviewed and negative, other than as noted in HPI.   Physical Exam Updated Vital Signs BP 130/68 (BP Location: Right Arm)   Pulse 91   Temp 98.7 F (37.1 C) (Oral)   Resp 18   Ht 5\' 7"  (1.702 m)   Wt (!) 144.7 kg (319 lb)   SpO2 96%   BMI 49.96 kg/m   Physical Exam  Constitutional: She appears well-developed and well-nourished. No distress.  Laying in bed. NAD. Obese. Crying.   HENT:  Head: Normocephalic and atraumatic.  Eyes: Conjunctivae are normal. Right eye exhibits no discharge. Left eye exhibits no discharge.  Neck: Neck supple.  Cardiovascular: Normal rate, regular rhythm and normal heart sounds.  Exam reveals no gallop and no friction rub.   No  murmur heard. Pulmonary/Chest: Effort normal and breath sounds normal. No respiratory distress.  Abdominal: Soft. She exhibits no distension. There is no tenderness.  Musculoskeletal: She exhibits no edema or tenderness.  Neurological: She is alert.  Skin: Skin is warm and dry.  Psychiatric:  Calm. Cooperative. Crying at times. Speech clear. Answering questions appropriately.   Nursing note and vitals reviewed.    ED Treatments / Results  Labs (all labs ordered are listed, but only abnormal results are displayed) Labs Reviewed  COMPREHENSIVE METABOLIC PANEL - Abnormal; Notable for the following:       Result Value   Potassium 3.1 (*)    All other components within normal limits  ACETAMINOPHEN LEVEL - Abnormal; Notable for the following:    Acetaminophen (Tylenol), Serum <10 (*)    All other components within normal limits  RAPID URINE DRUG SCREEN, HOSP PERFORMED - Abnormal; Notable for the following:    Opiates POSITIVE (*)    Cocaine POSITIVE (*)    Amphetamines POSITIVE (*)    Tetrahydrocannabinol POSITIVE (*)    All other components within normal limits  ETHANOL  SALICYLATE LEVEL  CBC  HCG, SERUM, QUALITATIVE  POC URINE PREG, ED    EKG  EKG Interpretation None       Radiology No results found.  Procedures Procedures (including critical care time)  Medications Ordered in ED Medications - No data to display   Initial Impression / Assessment and Plan / ED Course  I have reviewed the triage vital signs and the nursing notes.  Pertinent labs & imaging results that were available during my care of the patient were reviewed by me and considered in my medical decision making (see chart for details).     Pt with SI. Medically cleared. Psych eval.   Final Clinical Impressions(s) / ED Diagnoses   Final diagnoses:  Depression, unspecified depression type  Substance abuse    New Prescriptions New Prescriptions   No medications on file     Raeford Razor, MD 11/07/16 2337
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
## 2016-11-07 NOTE — ED Notes (Signed)
Patient admits to SI and denies HI and AVH at this time. Plan of care discussed with patient. Sandwich and soda given. Encouragement and support provided and safety maintain. Q 15 min safety checks in place.

## 2016-11-08 ENCOUNTER — Encounter (HOSPITAL_COMMUNITY): Payer: Self-pay

## 2016-11-08 ENCOUNTER — Inpatient Hospital Stay (HOSPITAL_COMMUNITY)
Admission: AD | Admit: 2016-11-08 | Discharge: 2016-11-12 | DRG: 885 | Disposition: A | Discharge status: Home / Self Care | Payer: Medicaid Other | Source: Intra-hospital | Attending: Psychiatry | Admitting: Psychiatry

## 2016-11-08 DIAGNOSIS — F1110 Opioid abuse, uncomplicated: Secondary | ICD-10-CM | POA: Diagnosis present

## 2016-11-08 DIAGNOSIS — Z825 Family history of asthma and other chronic lower respiratory diseases: Secondary | ICD-10-CM | POA: Diagnosis not present

## 2016-11-08 DIAGNOSIS — F1420 Cocaine dependence, uncomplicated: Secondary | ICD-10-CM | POA: Diagnosis present

## 2016-11-08 DIAGNOSIS — F1290 Cannabis use, unspecified, uncomplicated: Secondary | ICD-10-CM

## 2016-11-08 DIAGNOSIS — F314 Bipolar disorder, current episode depressed, severe, without psychotic features: Secondary | ICD-10-CM | POA: Diagnosis not present

## 2016-11-08 DIAGNOSIS — Z590 Homelessness: Secondary | ICD-10-CM

## 2016-11-08 DIAGNOSIS — Z8261 Family history of arthritis: Secondary | ICD-10-CM | POA: Diagnosis not present

## 2016-11-08 DIAGNOSIS — F1910 Other psychoactive substance abuse, uncomplicated: Secondary | ICD-10-CM | POA: Diagnosis present

## 2016-11-08 DIAGNOSIS — B182 Chronic viral hepatitis C: Secondary | ICD-10-CM | POA: Diagnosis present

## 2016-11-08 DIAGNOSIS — F1190 Opioid use, unspecified, uncomplicated: Secondary | ICD-10-CM

## 2016-11-08 DIAGNOSIS — F1220 Cannabis dependence, uncomplicated: Secondary | ICD-10-CM | POA: Diagnosis not present

## 2016-11-08 DIAGNOSIS — F1490 Cocaine use, unspecified, uncomplicated: Secondary | ICD-10-CM

## 2016-11-08 DIAGNOSIS — F1120 Opioid dependence, uncomplicated: Secondary | ICD-10-CM | POA: Diagnosis not present

## 2016-11-08 DIAGNOSIS — F17210 Nicotine dependence, cigarettes, uncomplicated: Secondary | ICD-10-CM | POA: Diagnosis not present

## 2016-11-08 DIAGNOSIS — Z87442 Personal history of urinary calculi: Secondary | ICD-10-CM

## 2016-11-08 DIAGNOSIS — Z8249 Family history of ischemic heart disease and other diseases of the circulatory system: Secondary | ICD-10-CM | POA: Diagnosis not present

## 2016-11-08 DIAGNOSIS — F1920 Other psychoactive substance dependence, uncomplicated: Secondary | ICD-10-CM

## 2016-11-08 DIAGNOSIS — F333 Major depressive disorder, recurrent, severe with psychotic symptoms: Secondary | ICD-10-CM | POA: Diagnosis not present

## 2016-11-08 DIAGNOSIS — R45851 Suicidal ideations: Secondary | ICD-10-CM | POA: Diagnosis not present

## 2016-11-08 DIAGNOSIS — G4700 Insomnia, unspecified: Secondary | ICD-10-CM | POA: Diagnosis not present

## 2016-11-08 DIAGNOSIS — F1123 Opioid dependence with withdrawal: Secondary | ICD-10-CM | POA: Diagnosis not present

## 2016-11-08 DIAGNOSIS — F4310 Post-traumatic stress disorder, unspecified: Secondary | ICD-10-CM | POA: Diagnosis present

## 2016-11-08 MED ORDER — HYDROXYZINE HCL 25 MG PO TABS
25.0000 mg | ORAL_TABLET | Freq: Four times a day (QID) | ORAL | Status: DC | PRN
  Administered 2016-11-08 – 2016-11-11 (×9): 25 mg via ORAL
  Filled 2016-11-08 (×9): qty 1

## 2016-11-08 MED ORDER — METHOCARBAMOL 500 MG PO TABS
500.0000 mg | ORAL_TABLET | Freq: Three times a day (TID) | ORAL | Status: DC | PRN
  Administered 2016-11-09 – 2016-11-11 (×5): 500 mg via ORAL
  Filled 2016-11-08 (×5): qty 1

## 2016-11-08 MED ORDER — LOPERAMIDE HCL 2 MG PO CAPS: 2.0000 mg | ORAL_CAPSULE | ORAL | Status: DC | PRN

## 2016-11-08 MED ORDER — ONDANSETRON 4 MG PO TBDP
4.0000 mg | ORAL_TABLET | Freq: Four times a day (QID) | ORAL | Status: DC | PRN
  Administered 2016-11-10: 4 mg via ORAL
  Filled 2016-11-08 (×2): qty 1

## 2016-11-08 MED ORDER — GABAPENTIN 300 MG PO CAPS: 600.0000 mg | ORAL_CAPSULE | Freq: Three times a day (TID) | ORAL | Status: DC

## 2016-11-08 MED ORDER — GABAPENTIN 100 MG PO CAPS
200.0000 mg | ORAL_CAPSULE | Freq: Two times a day (BID) | ORAL | Status: DC
  Filled 2016-11-08 (×2): qty 2

## 2016-11-08 MED ORDER — TRAZODONE HCL 50 MG PO TABS
50.0000 mg | ORAL_TABLET | Freq: Every evening | ORAL | Status: DC | PRN
  Administered 2016-11-08 – 2016-11-09 (×2): 50 mg via ORAL
  Filled 2016-11-08 (×2): qty 1

## 2016-11-08 MED ORDER — MAGNESIUM HYDROXIDE 400 MG/5ML PO SUSP: 30.0000 mL | Freq: Every day | ORAL | Status: DC | PRN

## 2016-11-08 MED ORDER — CLONIDINE HCL 0.1 MG PO TABS
0.1000 mg | ORAL_TABLET | Freq: Four times a day (QID) | ORAL | Status: AC
  Administered 2016-11-08 – 2016-11-10 (×9): 0.1 mg via ORAL
  Filled 2016-11-08 (×12): qty 1

## 2016-11-08 MED ORDER — GABAPENTIN 100 MG PO CAPS
200.0000 mg | ORAL_CAPSULE | Freq: Two times a day (BID) | ORAL | Status: DC
  Administered 2016-11-08: 200 mg via ORAL
  Filled 2016-11-08: qty 2

## 2016-11-08 MED ORDER — NAPROXEN 500 MG PO TABS
500.0000 mg | ORAL_TABLET | Freq: Two times a day (BID) | ORAL | Status: DC | PRN
  Administered 2016-11-08 – 2016-11-11 (×4): 500 mg via ORAL
  Filled 2016-11-08 (×4): qty 1

## 2016-11-08 MED ORDER — DICYCLOMINE HCL 20 MG PO TABS
20.0000 mg | ORAL_TABLET | Freq: Four times a day (QID) | ORAL | Status: DC | PRN
  Administered 2016-11-09: 20 mg via ORAL
  Filled 2016-11-08: qty 1

## 2016-11-08 MED ORDER — NICOTINE 21 MG/24HR TD PT24
21.0000 mg | MEDICATED_PATCH | Freq: Every day | TRANSDERMAL | Status: DC
  Administered 2016-11-09 – 2016-11-11 (×3): 21 mg via TRANSDERMAL
  Filled 2016-11-08 (×6): qty 1

## 2016-11-08 MED ORDER — CLONIDINE HCL 0.1 MG PO TABS
0.1000 mg | ORAL_TABLET | Freq: Every day | ORAL | Status: DC
  Filled 2016-11-08: qty 1

## 2016-11-08 MED ORDER — CLONIDINE HCL 0.1 MG PO TABS
0.1000 mg | ORAL_TABLET | ORAL | Status: DC
  Administered 2016-11-11 – 2016-11-12 (×3): 0.1 mg via ORAL
  Filled 2016-11-08 (×4): qty 1

## 2016-11-08 MED ORDER — ALUM & MAG HYDROXIDE-SIMETH 200-200-20 MG/5ML PO SUSP: 30.0000 mL | ORAL | Status: DC | PRN

## 2016-11-08 NOTE — Progress Notes (Signed)
11/08/16 1405:  LRT introduced self to pt and offered activities, pt declined.  Caroll RancherMarjette Faylinn Schwenn, LRT/CTRS

## 2016-11-08 NOTE — BH Assessment (Signed)
[name] Assessment Progress Note  Per Thedore MinsMojeed Akintayo, MD, this pt requires psychiatric hospitalization at this time.  Laura Heinrichina Tate, RN, [id number]; she will call when they are ready to receive pt.  Pt has signed Voluntary Admission and Consent for Treatment, as well as Consent to Release Information to [facility], her outpatient provider, and a notification call has been placed.  [facility]  Pt's nurse, Angelique BlonderDenise, has been notified, and agrees to send original paperwork along with pt via [facility], and to call report to [phone].  [name], MA Triage Specialist [phone]

## 2016-11-08 NOTE — ED Notes (Signed)
Report given to Heather, RN
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
## 2016-11-08 NOTE — ED Notes (Signed)
Pt was transported safely by [facility]. She hass all belongings.

## 2016-11-08 NOTE — Consult Note (Signed)
[facility] Consult   Reason for Consult:  Suicidal, depression Referring Physician:  EDP Patient Identification: [name] MRN:  [id number] Principal Diagnosis: Bipolar 1 disorder, depressed, severe ([place]) Diagnosis:   Patient Active Problem List   Diagnosis Date Noted  . Polysubstance (including opioids) dependence with physiological dependence ([place]) [F19.20] 11/08/2016    Priority: High  . Bipolar 1 disorder, depressed, severe ([place]) [F31.4] 11/08/2016    Priority: High  . PTSD (post-traumatic stress disorder) [F43.10] 12/10/2014    Priority: High  . Opiate withdrawal ([place]) [F11.23]   . 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 Cocaine dependence ([place]) [F14.20] 12/10/2014  . Polysubstance dependence ([place]) [F19.20] 12/10/2014  . Suicidal ideations [R45.851] 12/10/2014  . Anemia [D64.9] 03/08/2012  . Metrorrhagia [N92.1] 03/06/2012  . Dysmenorrhea [N94.6] 03/06/2012    Total Time spent with patient: 45 minutes  Subjective:   [name] is a 31 y.o. female patient admitted with depression, drug abuse and suicidal thoughts.  HPI:  Patient who reports history of Bipolar disorder, MDD, PTSD and substance use disorder. Patient reports being severly depressed, suicidal, hopeless since her fiance died in [facility] in 2016/03/03 and had a stillborn a month ago. Patient reports that she has stopped using her prescribed medications and has been self medicating with illicit drugs.  Past Psychiatric History: as above  Risk to Self: Suicidal Ideation: Yes-Currently Present Suicidal Intent: Yes-Currently Present Is patient at risk for suicide?: Yes Suicidal Plan?: Yes-Currently Present Specify Current Suicidal Plan: overdose Access to Means: Yes Specify Access to Suicidal Means: access to heroin What has been your use of drugs/alcohol within the last 12 months?: using cocaine and heroin daily  Triggers for Past Attempts: Family contact Intentional  Self Injurious Behavior: None Risk to Others: Homicidal Ideation: No Thoughts of Harm to Others: No Current Homicidal Intent: No Current Homicidal Plan: No Access to Homicidal Means: No Identified Victim: none History of harm to others?: No Assessment of Violence: None Noted Violent Behavior Description: no Does patient have access to weapons?: No Criminal Charges Pending?: No Does patient have a court date: No Prior Inpatient Therapy: Prior Inpatient Therapy: Yes Prior Therapy Dates: 23-Sep-2017Prior Therapy Facilty/Provider(s): [facility] Reason for Treatment: SI  Prior Outpatient Therapy: Prior Outpatient Therapy: Yes Does patient have an ACCT team?: No Does patient have Intensive In-House Services?  : No Does patient have [facility] services? : No Does patient have P4CC services?: No  Past Medical History:  Past Medical History:  Diagnosis Date  . Bipolar 1 disorder, depressed ([place])   . Cocaine abuse   . Depression   . Diabetes mellitus without complication ([place])   . Hep C w/o coma, chronic ([place])   . Kidney stone   . Migraine   . Obesity   . Pregnancy induced hypertension   . PTSD (post-traumatic stress disorder)   . Substance abuse   . Urinary tract infection   . UTI (lower urinary tract infection)     Past Surgical History:  Procedure Laterality Date  . kidney stones    . lipotripsy     Family History:  Family History  Problem Relation Age of Onset  . Rheum arthritis Mother   . Asthma Mother   . Hypertension Mother   . CAD Other   . Other Neg Hx    Family Psychiatric  History:  Social History:  History  Alcohol Use No    Comment: denied any alcohol use     History  Drug Use  . Types: Cocaine, Marijuana, IV, Heroin    Comment: Pt endorsed 1 gram of heroin and cocaine on 07/13/16    Social History   Social History  . Marital status: Legally Separated    Spouse name: N/A  . Number of children: N/A  . Years of education: N/A   Social History Main  Topics  . Smoking status: Current Every Day Smoker    Packs/day: 1.00    Types: Cigarettes  . Smokeless tobacco: Never Used  . Alcohol use No     Comment: denied any alcohol use  . Drug use: Yes    Types: Cocaine, Marijuana, IV, Heroin     Comment: Pt endorsed 1 gram of heroin and cocaine on 07/13/16  . Sexual activity: Yes    Birth control/ protection: Injection   Other Topics Concern  . None   Social History Narrative  . None   Additional Social History:    Allergies:   Allergies  Allergen Reactions  . Acetaminophen Nausea And Vomiting and Anaphylaxis    Has tolerated percocet in the past  . Darvocet [Propoxyphene N-Acetaminophen] Hives and Nausea And Vomiting  . Morphine And Related Itching    Needs benadryl prior to  administration-tolerates, yet pt OK with Tramadol  . Sulfa Antibiotics Hives    Yet OK with Glimepiride   . Tomato Hives  . Pioglitazone Swelling    Swelling of hands, legs and feet     Labs:  Results for orders placed or performed during the hospital encounter of 11/07/16 (from the past 48 hour(s))  Rapid urine drug screen (hospital performed)     Status: Abnormal   Collection Time: 11/07/16  3:32 PM  Result Value Ref Range   Opiates POSITIVE (A) NONE DETECTED   Cocaine POSITIVE (A) NONE DETECTED   Benzodiazepines NONE DETECTED NONE DETECTED   Amphetamines POSITIVE (A) NONE DETECTED   Tetrahydrocannabinol POSITIVE (A) NONE DETECTED   Barbiturates NONE DETECTED NONE DETECTED    Comment:        DRUG SCREEN FOR MEDICAL PURPOSES ONLY.  IF CONFIRMATION IS NEEDED FOR ANY PURPOSE, NOTIFY LAB WITHIN 5 DAYS.        LOWEST DETECTABLE LIMITS FOR URINE DRUG SCREEN Drug Class       Cutoff (ng/mL) Amphetamine      1000 Barbiturate      200 Benzodiazepine   245 Tricyclics       809 Opiates          300 Cocaine          300 THC              50   POC urine preg, ED     Status: None   Collection Time: 11/07/16  3:37 PM  Result Value Ref Range   Preg  Test, Ur NEGATIVE NEGATIVE    Comment:        THE SENSITIVITY OF THIS METHODOLOGY IS >24 mIU/mL   Ethanol     Status: None   Collection Time: 11/07/16  5:08 PM  Result Value Ref Range   Alcohol, Ethyl (B) <5 <5 mg/dL    Comment:        LOWEST DETECTABLE LIMIT FOR SERUM ALCOHOL IS 5 mg/dL FOR MEDICAL PURPOSES ONLY   Salicylate level     Status: None   Collection Time: 11/07/16  5:08 PM  Result Value Ref Range   Salicylate Lvl <9.8 2.8 - 30.0 mg/dL  Acetaminophen level  Status: Abnormal   Collection Time: 11/07/16  5:08 PM  Result Value Ref Range   Acetaminophen (Tylenol), Serum <10 (L) 10 - 30 ug/mL    Comment:        THERAPEUTIC CONCENTRATIONS VARY SIGNIFICANTLY. A RANGE OF 10-30 ug/mL MAY BE AN EFFECTIVE CONCENTRATION FOR MANY PATIENTS. HOWEVER, SOME ARE BEST TREATED AT CONCENTRATIONS OUTSIDE THIS RANGE. ACETAMINOPHEN CONCENTRATIONS >150 ug/mL AT 4 HOURS AFTER INGESTION AND >50 ug/mL AT 12 HOURS AFTER INGESTION ARE OFTEN ASSOCIATED WITH TOXIC REACTIONS.   Comprehensive metabolic panel     Status: Abnormal   Collection Time: 11/07/16  5:09 PM  Result Value Ref Range   Sodium 140 135 - 145 mmol/L   Potassium 3.1 (L) 3.5 - 5.1 mmol/L   Chloride 104 101 - 111 mmol/L   CO2 27 22 - 32 mmol/L   Glucose, Bld 98 65 - 99 mg/dL   BUN 7 6 - 20 mg/dL   Creatinine, Ser 0.89 0.44 - 1.00 mg/dL   Calcium 9.2 8.9 - 10.3 mg/dL   Total Protein 7.6 6.5 - 8.1 g/dL   Albumin 3.7 3.5 - 5.0 g/dL   AST 19 15 - 41 U/L   ALT 19 14 - 54 U/L   Alkaline Phosphatase 51 38 - 126 U/L   Total Bilirubin 0.4 0.3 - 1.2 mg/dL   GFR calc non Af Amer >60 >60 mL/min   GFR calc Af Amer >60 >60 mL/min    Comment: (NOTE) The eGFR has been calculated using the CKD EPI equation. This calculation has not been validated in all clinical situations. eGFR's persistently <60 mL/min signify possible Chronic Kidney Disease.    Anion gap 9 5 - 15  cbc     Status: None   Collection Time: 11/07/16  5:09 PM   Result Value Ref Range   WBC 7.9 4.0 - 10.5 K/uL   RBC 5.09 3.87 - 5.11 MIL/uL   Hemoglobin 13.6 12.0 - 15.0 g/dL   HCT 40.8 36.0 - 46.0 %   MCV 80.2 78.0 - 100.0 fL   MCH 26.7 26.0 - 34.0 pg   MCHC 33.3 30.0 - 36.0 g/dL   RDW 15.2 11.5 - 15.5 %   Platelets 248 150 - 400 K/uL    Current Facility-Administered Medications  Medication Dose Route Frequency Provider Last Rate Last Dose  . cloNIDine (CATAPRES) tablet 0.1 mg  0.1 mg Oral QID Virgel Manifold, MD   0.1 mg at 11/08/16 1028   Followed by  . [START ON 11/09/2016] cloNIDine (CATAPRES) tablet 0.1 mg  0.1 mg Oral BID Virgel Manifold, MD       Followed by  . [START ON 11/12/2016] cloNIDine (CATAPRES) tablet 0.1 mg  0.1 mg Oral Daily Virgel Manifold, MD      . dicyclomine (BENTYL) tablet 20 mg  20 mg Oral Q6H PRN Virgel Manifold, MD      . hydrOXYzine (ATARAX/VISTARIL) tablet 25 mg  25 mg Oral Q6H PRN Virgel Manifold, MD   25 mg at 11/07/16 2107  . loperamide (IMODIUM) capsule 2-4 mg  2-4 mg Oral PRN Virgel Manifold, MD      . methocarbamol (ROBAXIN) tablet 500 mg  500 mg Oral Q8H PRN Virgel Manifold, MD   500 mg at 11/08/16 1111  . naproxen (NAPROSYN) tablet 500 mg  500 mg Oral BID PRN Virgel Manifold, MD   500 mg at 11/08/16 1111  . nicotine (NICODERM CQ - dosed in mg/24 hours) patch 21 mg  21 mg Transdermal  Daily Virgel Manifold, MD   21 mg at 11/08/16 1030  . ondansetron (ZOFRAN-ODT) disintegrating tablet 4 mg  4 mg Oral Q6H PRN Virgel Manifold, MD       Current Outpatient Prescriptions  Medication Sig Dispense Refill  . gabapentin (NEURONTIN) 300 MG capsule Take 1 capsule (300 mg total) by mouth at bedtime. (Patient taking differently: Take 600 mg by mouth 3 (three) times daily. ) 30 capsule 0  . SUBOXONE 8-2 MG FILM Place 1 Film under the tongue 3 (three) times daily.  0  . magic mouthwash w/lidocaine SOLN Take 5 mLs by mouth 4 (four) times daily as needed for mouth pain. Swish, gargle, and spit every 6 hours as needed (Patient not taking:  Reported on 11/07/2016) 120 mL 0  . nicotine polacrilex (NICORETTE) 2 MG gum Take 1 each (2 mg total) by mouth as needed for smoking cessation. (Patient not taking: Reported on 07/15/2016) 100 tablet 0  . penicillin v potassium (VEETID) 500 MG tablet Take 1 tablet (500 mg total) by mouth 4 (four) times daily. (Patient not taking: Reported on 11/07/2016) 28 tablet 0    Musculoskeletal: Strength & Muscle Tone: within normal limits Gait & Station: normal Patient leans: N/A  Psychiatric Specialty Exam: Physical Exam  Psychiatric: Her speech is normal. She is slowed and withdrawn. Cognition and memory are normal. She expresses impulsivity. She exhibits a depressed mood. She expresses suicidal ideation.    Review of Systems  Constitutional: Positive for malaise/fatigue.  HENT: Negative.   Eyes: Negative.   Respiratory: Negative.   Cardiovascular: Negative.   Gastrointestinal: Positive for nausea.  Genitourinary: Negative.   Musculoskeletal: Positive for myalgias.  Skin: Negative.   Neurological: Positive for tremors.  Endo/Heme/Allergies: Negative.   Psychiatric/Behavioral: Positive for depression, substance abuse and suicidal ideas.    Blood pressure 129/65, pulse 68, temperature 98.7 F (37.1 C), temperature source Oral, resp. rate 16, height '5\' 7"'  (1.702 m), weight (!) 144.7 kg (319 lb), SpO2 100 %.Body mass index is 49.96 kg/m.  General Appearance: Casual  Eye Contact:  Good  Speech:  Clear and Coherent  Volume:  Normal  Mood:  Depressed and Hopeless  Affect:  Constricted  Thought Process:  Coherent  Orientation:  Full (Time, Place, and Person)  Thought Content:  Logical  Suicidal Thoughts:  Yes.  without intent/plan  Homicidal Thoughts:  No  Memory:  Immediate;   Fair Recent;   Good Remote;   Good  Judgement:  Poor  Insight:  Shallow  Psychomotor Activity:  Psychomotor Retardation  Concentration:  Concentration: Poor and Attention Span: Poor  Recall:  Good  Fund of  Knowledge:  Good  Language:  Good  Akathisia:  No  Handed:  Right  AIMS (if indicated):     Assets:  Communication Skills Desire for Improvement Social Support  ADL's:  Intact  Cognition:  WNL  Sleep:   poor     Treatment Plan Summary: Daily contact with patient to assess and evaluate symptoms and progress in treatment and Medication management  Start Clonidine detox for opiate, Gabapentin 200 mg bid for aggressive behavior, cocaine   Disposition: Recommend psychiatric Inpatient admission when medically cleared.  Corena Pilgrim, MD 11/08/2016 11:36 AM

## 2016-11-08 NOTE — Progress Notes (Signed)
[name] is a 31 year old female being admitted voluntarily to [facility] from [facility].  She came to the ED with suicidal ideations and substance abuse issues. She reported that she has been using cocaine and injecting heroin for around 2 years daily. She reported stressors as fiance passed away in September 2017, had a stillborn child around that time, gave up custody of her two daughters to a family member in March 2018 and is currently homeless. She reported increase in depression and has had suicidal ideations with a plan to overdose over the past week. She is feeling hopeless, helpless and worthless. She is diagnosed with Bipolar I disorder depressed, Cocaine use disorder severe and Opiate use disorder severe.  She denies SI at this time and is able to contract for safety on the unit.  She reported that she has chronic pain in both her legs and it is an 8/10.  She reported taking Neurontin for that pain.  Oriented her to the unit.  Admission paperwork completed and signed.  Belongings searched and secured in locker # 29.  Skin assessment completed and noted scars on left lower leg and multiple tattoos.  Q 15 minute checks initiated for safety.  We will monitor the progress towards her goals.

## 2016-11-09 DIAGNOSIS — F1910 Other psychoactive substance abuse, uncomplicated: Secondary | ICD-10-CM

## 2016-11-09 DIAGNOSIS — F314 Bipolar disorder, current episode depressed, severe, without psychotic features: Secondary | ICD-10-CM

## 2016-11-09 DIAGNOSIS — F17210 Nicotine dependence, cigarettes, uncomplicated: Secondary | ICD-10-CM

## 2016-11-09 DIAGNOSIS — R45851 Suicidal ideations: Secondary | ICD-10-CM

## 2016-11-09 MED ORDER — KETOROLAC TROMETHAMINE 60 MG/2ML IM SOLN
60.0000 mg | Freq: Once | INTRAMUSCULAR | Status: AC
  Administered 2016-11-09: 60 mg via INTRAMUSCULAR
  Filled 2016-11-09 (×2): qty 2

## 2016-11-09 MED ORDER — ENSURE ENLIVE PO LIQD: 237.0000 mL | Freq: Two times a day (BID) | ORAL | Status: DC | PRN

## 2016-11-09 MED ORDER — ADULT MULTIVITAMIN W/MINERALS CH
1.0000 | ORAL_TABLET | Freq: Every day | ORAL | Status: DC
  Administered 2016-11-09 – 2016-11-12 (×4): 1 via ORAL
  Filled 2016-11-09 (×6): qty 1

## 2016-11-09 MED ORDER — GABAPENTIN 600 MG PO TABS
600.0000 mg | ORAL_TABLET | Freq: Once | ORAL | Status: AC
  Administered 2016-11-09: 600 mg via ORAL
  Filled 2016-11-09 (×2): qty 1

## 2016-11-09 NOTE — Plan of Care (Signed)
Problem: Safety: Goal: Periods of time without injury will increase Outcome: Progressing Pt has not harmed self or others.  She denies SI/HI and verbally contracts for safety.

## 2016-11-09 NOTE — Progress Notes (Signed)
NUTRITION ASSESSMENT  Pt identified as at risk on the Malnutrition Screen Tool  INTERVENTION: 1. Educated patient on the importance of nutrition and encouraged intake of food and beverages. 2. Discussed weight goals. 3. Supplements:  - Will order Ensure Enlive BID PRN, eachsupplement provides 350 kcal and 20 grams of protein - Will order daily multivitamin with minerals.   NUTRITION DIAGNOSIS: Unintentional weight loss related to sub-optimal intake as evidenced by pt report.   Goal: Pt to meet >/= 90% of their estimated nutrition needs.  Monitor:  PO intake  Assessment:  Pt admitted for increasing depression with SI; she also has hx of heroin and cocaine use. [facility] and pt also had a stillborn in September. She stopped taking prescribed medications and instead was self medicating with illegal drugs. Pt is currently homeless. Per chart review, pt has lost 27 lbs (12% body weight) in ~4 months which is significant for time frame.  Continue to encourage PO intakes of meals and snacks. Will order supplements as outlined above.     31 y.o. female  Height: Ht Readings from Last 1 Encounters:  11/08/16 5\' 7"  (1.702 m)    Weight: Wt Readings from Last 1 Encounters:  11/08/16 290 lb (131.5 kg)    Weight Hx: Wt Readings from Last 10 Encounters:  11/08/16 290 lb (131.5 kg)  11/07/16 (!) 319 lb (144.7 kg)  07/15/16 (!) 317 lb (143.8 kg)  03/10/16 (!) 338 lb (153.3 kg)  12/10/15 (!) 343 lb (155.6 kg)  08/26/15 (!) 331 lb (150.1 kg)  12/21/14 300 lb (136.1 kg)  12/10/14 (!) 308 lb (139.7 kg)  12/03/14 (!) 301 lb (136.5 kg)  06/17/14 (!) 336 lb 9.6 oz (152.7 kg)    BMI:  Body mass index is 45.42 kg/m. Pt meets criteria for morbid obesity based on current BMI.  Estimated Nutritional Needs: Kcal: 25-30 kcal/kg Protein: > 1 gram protein/kg Fluid: 1 ml/kcal  Diet Order: Diet regular Room service appropriate? Yes; Fluid consistency: Thin Pt is  also offered choice of unit snacks mid-morning and mid-afternoon.  Pt is eating as desired.   Lab results and medications reviewed.     Trenton GammonJessica Quashon Jesus, MS, RD, LDN, [facility] Inpatient Clinical Dietitian Pager # [phone] After hours/weekend pager # [phone]

## 2016-11-09 NOTE — [facility] Group Notes (Signed)
[name] LCSW Group Therapy 11/09/2016 1:15pm  Type of Therapy: Group Therapy- Feelings Around Relapse and Recovery  Pt arrived at the end of group but was attentive briefly.  Vernie ShanksLauren Myha Arizpe, LCSW [phone] 11/09/2016 3:22 PM

## 2016-11-09 NOTE — Progress Notes (Signed)
D:Pt has c/o stomach cramps and legs spasms today. She rates depression as a 5 and anxiety as an 8.  A:Offered support, encouragement and 15 minute checks. R:Pt denies si and hi. Safety maintained on the unit.

## 2016-11-09 NOTE — Progress Notes (Signed)
Psychoeducational Group Note  Date:  11/09/2016 Time:  2329  Group Topic/Focus:  Wrap-Up Group:   The focus of this group is to help patients review their daily goal of treatment and discuss progress on daily workbooks.  Participation Level: Did Not Attend  Participation Quality:  Not Applicable  Affect:  Not Applicable  Cognitive:  Not Applicable  Insight:  Not Applicable  Engagement in Group: Not Applicable  Additional Comments:  The patient did not attend the A.A.meeting since she remained in her bedroom.   Hazle CocaGOODMAN, Autymn Omlor S 11/09/2016, 11:29 PM

## 2016-11-09 NOTE — Social Work (Signed)
Patient declined Transitional Care Team services.   Anne Cunningham, LCSW Lead Clinical Social Worker Phone:  [phone]  

## 2016-11-09 NOTE — Progress Notes (Signed)
Recreation Therapy Notes  Date:  11/09/16 Time: 0930 Location: [facility] Dayroom  Group Topic: Stress Management  Goal Area(s) Addresses:  Patient will verbalize importance of using healthy stress management.  Patient will identify positive emotions associated with healthy stress management.   Intervention: Stress Management  Activity :  Guided Imagery.  LRT introduced the stress management technique of guided imagery.  LRT read a script to allow patients to engage in the activity.  Patients were to follow along with as LRT read the script to participate in the activity.  Education:  Stress Management, Discharge Planning.   Education Outcome: Acknowledges edcuation/In group clarification offered/Needs additional education  Clinical Observations/Feedback: Pt did not attend group.   Caroll RancherMarjette Bryse Blanchette, LRT/CTRS         Caroll RancherLindsay, Allizon Woznick A 11/09/2016 11:31 AM
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
## 2016-11-09 NOTE — Progress Notes (Signed)
Patient has been isolative to her room tonight. She came to medication window to receive her medication and complained of having a migraine as to her reason for trying to rest. Writer received a one time order for medication to help alleviate her migraine. She continues to rest, will monitor effectiveness of medication. Support given and safety maintained on unit with 15 min checks.

## 2016-11-09 NOTE — [facility] Suicide Risk Assessment (Signed)
[name] INPATIENT:  Family/Significant Other Suicide Prevention Education  Suicide Prevention Education:  Education Completed; [name] (pt's mother) [phone] has been identified by the patient as the family member/significant other with whom the patient will be residing, and identified as the person(s) who will aid the patient in the event of a mental health crisis (suicidal ideations/suicide attempt).  With written consent from the patient, the family member/significant other has been provided the following suicide prevention education, prior to the and/or following the discharge of the patient.  The suicide prevention education provided includes the following:  Suicide risk factors  Suicide prevention and interventions  National Suicide Hotline telephone number  [facility] assessment telephone number  [facility] 911  [facility] and/or Residential Mobile Crisis Unit telephone number  Request made of family/significant other to:  Remove weapons (e.g., guns, rifles, knives), all items previously/currently identified as safety concern.    Remove drugs/medications (over-the-counter, prescriptions, illicit drugs), all items previously/currently identified as a safety concern.  The family member/significant other verbalizes understanding of the suicide prevention education information provided.  The family member/significant other agrees to remove the items of safety concern listed above.  Reeva Davern N Smart LCSW 11/09/2016, 2:14 PM

## 2016-11-09 NOTE — Progress Notes (Signed)
Patient provided with BATS application and asked to complete and turn back in to Child psychotherapistsocial worker.  Trula SladeHeather Smart, MSW, LCSW Clinical Social Worker 11/09/2016 2:27 PM

## 2016-11-09 NOTE — Progress Notes (Signed)
D: Pt was at the nurse's station upon initial approach.  Pt presents with anxious, depressed affect and mood.  Pt reports anxiety.  Her goal is to "try to sleep."  Pt denies SI/HI, denies hallucinations, reports bilateral leg pain of 9/10.  Pt has been visible in milieu interacting with peers and staff appropriately.  Pt attended evening group.    A: Introduced self to pt.  Actively listened to pt and offered support and encouragement. HS clonidine was held because pt's blood pressure was low.  PRN medication administered for anxiety, pain, and sleep.  Q15 minute safety checks maintained.  R: Pt is safe on the unit.  Pt is compliant with medications.  Pt verbally contracts for safety.  Will continue to monitor and assess.
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
## 2016-11-09 NOTE — Social Work (Signed)
Referred to [facility], is [place] resident.  Corian Handley, LCSW Lead Clinical Social Worker Phone:  [phone]  

## 2016-11-10 DIAGNOSIS — F1123 Opioid dependence with withdrawal: Secondary | ICD-10-CM

## 2016-11-10 DIAGNOSIS — G4700 Insomnia, unspecified: Secondary | ICD-10-CM

## 2016-11-10 DIAGNOSIS — F1290 Cannabis use, unspecified, uncomplicated: Secondary | ICD-10-CM

## 2016-11-10 DIAGNOSIS — F1490 Cocaine use, unspecified, uncomplicated: Secondary | ICD-10-CM

## 2016-11-10 DIAGNOSIS — F1190 Opioid use, unspecified, uncomplicated: Secondary | ICD-10-CM

## 2016-11-10 MED ORDER — GABAPENTIN 100 MG PO CAPS
100.0000 mg | ORAL_CAPSULE | Freq: Two times a day (BID) | ORAL | Status: DC
  Administered 2016-11-10 – 2016-11-12 (×4): 100 mg via ORAL
  Filled 2016-11-10 (×7): qty 1

## 2016-11-10 MED ORDER — TRAZODONE HCL 100 MG PO TABS
100.0000 mg | ORAL_TABLET | Freq: Every evening | ORAL | Status: DC | PRN
  Administered 2016-11-10 – 2016-11-11 (×2): 100 mg via ORAL
  Filled 2016-11-10 (×2): qty 1

## 2016-11-10 MED ORDER — TOPIRAMATE 25 MG PO TABS
25.0000 mg | ORAL_TABLET | Freq: Two times a day (BID) | ORAL | Status: DC | PRN
  Administered 2016-11-10 – 2016-11-11 (×4): 25 mg via ORAL
  Filled 2016-11-10 (×5): qty 1

## 2016-11-10 MED ORDER — ARIPIPRAZOLE 5 MG PO TABS
5.0000 mg | ORAL_TABLET | Freq: Once | ORAL | Status: AC
  Administered 2016-11-10: 5 mg via ORAL
  Filled 2016-11-10 (×2): qty 1

## 2016-11-10 MED ORDER — POTASSIUM CHLORIDE CRYS ER 20 MEQ PO TBCR
20.0000 meq | EXTENDED_RELEASE_TABLET | Freq: Two times a day (BID) | ORAL | Status: AC
  Administered 2016-11-10 – 2016-11-12 (×4): 20 meq via ORAL
  Filled 2016-11-10 (×5): qty 1

## 2016-11-10 NOTE — Progress Notes (Signed)
[facility] MD Progress Note  11/10/2016 1:58 PM [name]  MRN:  [id number] Subjective: Patient reports " I am feeling so, so." patient has concerns with headaches/migraines and anxiety.  Objective: [name] is awake, alert and oriented *3, Seen resting in dayroom interacting with peers. Patient has concerns with her home medica ition benign restarted.  Denies suicidal or homicidal ideation during this assessment.  Patient reports auditory hallucination denies command in nature. denies visual hallucination and does not appear to be responding to internal stimuli. Patient reports she is medication compliant without mediation side effects, states she is interested in restarting all her medication for depression. Reports she has been off her medication for about 5 months.   States her depression 5/10. Support, encouragement and reassurance was provided.   Principal Problem: Polysubstance abuse Diagnosis:   Patient Active Problem List   Diagnosis Date Noted  . Polysubstance (including opioids) dependence with physiological dependence (HCC) [F19.20] 11/08/2016  . Bipolar 1 disorder, depressed, severe (HCC) [F31.4] 11/08/2016  . Polysubstance abuse [F19.10] 11/08/2016  . Opiate withdrawal (HCC) [F11.23]   . Cocaine-induced mood disorder (HCC) [F14.94] 06/17/2015  . Cocaine dependence (HCC) [F14.20] 12/10/2014  . Polysubstance dependence (HCC) [F19.20] 12/10/2014  . Suicidal ideations [R45.851] 12/10/2014  . PTSD (post-traumatic stress disorder) [F43.10] 12/10/2014  . Anemia [D64.9] 03/08/2012  . Metrorrhagia [N92.1] 03/06/2012  . Dysmenorrhea [N94.6] 03/06/2012   Total Time spent with patient: 30 minutes  Past Psychiatric History:   Past Medical History:  Past Medical History:  Diagnosis Date  . Bipolar 1 disorder, depressed (HCC)   . Cocaine abuse   . Depression   . Diabetes mellitus without complication (HCC)   . Hep C w/o coma, chronic (HCC)   . Kidney stone   . Migraine   .  Obesity   . Pregnancy induced hypertension   . PTSD (post-traumatic stress disorder)   . Substance abuse   . Urinary tract infection   . UTI (lower urinary tract infection)     Past Surgical History:  Procedure Laterality Date  . kidney stones    . lipotripsy     Family History:  Family History  Problem Relation Age of Onset  . Rheum arthritis Mother   . Asthma Mother   . Hypertension Mother   . CAD Other   . Other Neg Hx    Family Psychiatric  History:  Social History:  History  Alcohol Use No    Comment: denied any alcohol use     History  Drug Use  . Types: Cocaine, Marijuana, IV, Heroin    Comment: Pt endorsed 1 gram of heroin and cocaine on 07/13/16    Social History   Social History  . Marital status: Legally Separated    Spouse name: N/A  . Number of children: N/A  . Years of education: N/A   Social History Main Topics  . Smoking status: Current Every Day Smoker    Packs/day: 1.00    Types: Cigarettes  . Smokeless tobacco: Never Used  . Alcohol use No     Comment: denied any alcohol use  . Drug use: Yes    Types: Cocaine, Marijuana, IV, Heroin     Comment: Pt endorsed 1 gram of heroin and cocaine on 07/13/16  . Sexual activity: Yes    Birth control/ protection: Injection   Other Topics Concern  . None   Social History Narrative  . None   Additional Social History:    Pain Medications: Pt  abuses heroin   Prescriptions: NA  Over the Counter: See PTA Negative Consequences of Use: Financial, Personal relationships Withdrawal Symptoms: Anorexia, Irritability, Weakness, Agitation Name of Substance 1: Cocaine 1 - Age of First Use: unknown 1 - Amount (size/oz): 1 gram 1 - Frequency: daily  1 - Duration: past couple of weeks 1 - Last Use / Amount: 11/06/16 Name of Substance 2: Heroin 2 - Age of First Use: 28 years  2 - Amount (size/oz): 1- 1.5 grams 2 - Frequency: daily  2 - Duration: 2 years  2 - Last Use / Amount: 11/06/16                 Sleep: Fair  Appetite:  Fair  Current Medications: Current Facility-Administered Medications  Medication Dose Route Frequency Provider Last Rate Last Dose  . alum & mag hydroxide-simeth (MAALOX/MYLANTA) 200-200-20 MG/5ML suspension 30 mL  30 mL Oral Q4H PRN Withrow, John C, FNP      . cloNIDine (CATAPRES) tablet 0.1 mg  0.1 mg Oral QID Withrow, John C, FNP   0.1 mg at 11/10/16 1310   Followed by  . [START ON 11/11/2016] cloNIDine (CATAPRES) tablet 0.1 mg  0.1 mg Oral BH-qamhs Withrow, Everardo All, FNP       Followed by  . [START ON 11/13/2016] cloNIDine (CATAPRES) tablet 0.1 mg  0.1 mg Oral QAC breakfast Withrow, John C, FNP      . dicyclomine (BENTYL) tablet 20 mg  20 mg Oral Q6H PRN Beau Fanny, FNP   20 mg at 11/09/16 0908  . feeding supplement (ENSURE ENLIVE) (ENSURE ENLIVE) liquid 237 mL  237 mL Oral BID PRN Cobos, Fernando A, MD      . gabapentin (NEURONTIN) capsule 100 mg  100 mg Oral BID Laura Rack, NP      . hydrOXYzine (ATARAX/VISTARIL) tablet 25 mg  25 mg Oral Q6H PRN Beau Fanny, FNP   25 mg at 11/10/16 0900  . loperamide (IMODIUM) capsule 2-4 mg  2-4 mg Oral PRN Withrow, John C, FNP      . magnesium hydroxide (MILK OF MAGNESIA) suspension 30 mL  30 mL Oral Daily PRN Withrow, John C, FNP      . methocarbamol (ROBAXIN) tablet 500 mg  500 mg Oral Q8H PRN Beau Fanny, FNP   500 mg at 11/10/16 1311  . multivitamin with minerals tablet 1 tablet  1 tablet Oral Daily Cobos, Rockey Situ, MD   1 tablet at 11/10/16 0856  . naproxen (NAPROSYN) tablet 500 mg  500 mg Oral BID PRN Beau Fanny, FNP   500 mg at 11/09/16 1830  . nicotine (NICODERM CQ - dosed in mg/24 hours) patch 21 mg  21 mg Transdermal Daily Laveda Abbe, NP   21 mg at 11/10/16 0856  . ondansetron (ZOFRAN-ODT) disintegrating tablet 4 mg  4 mg Oral Q6H PRN Withrow, Everardo All, FNP      . potassium chloride SA (K-DUR,KLOR-CON) CR tablet 20 mEq  20 mEq Oral BID Laura Rack, NP      . topiramate (TOPAMAX)  tablet 25 mg  25 mg Oral BID PRN Laura Rack, NP   25 mg at 11/10/16 1104  . traZODone (DESYREL) tablet 100 mg  100 mg Oral QHS PRN Laura Rack, NP        Lab Results: No results found for this or any previous visit (from the past 48 hour(s)).  Blood Alcohol level:  Lab Results  Component  Value Date   [facility] <5 11/07/2016   ETH <5 07/15/2016    Metabolic Disorder Labs: Lab Results  Component Value Date   HGBA1C 5.4 03/10/2016   MPG 108 03/10/2016   Lab Results  Component Value Date   PROLACTIN 17.7 03/10/2016   Lab Results  Component Value Date   CHOL 148 03/10/2016   TRIG 187 (H) 03/10/2016   HDL 31 (L) 03/10/2016   CHOLHDL 4.8 03/10/2016   VLDL 37 03/10/2016   LDLCALC 80 03/10/2016    Physical Findings: AIMS: Facial and Oral Movements Muscles of Facial Expression: None, normal Lips and Perioral Area: None, normal Jaw: None, normal Tongue: None, normal,Extremity Movements Upper (arms, wrists, hands, fingers): None, normal Lower (legs, knees, ankles, toes): None, normal, Trunk Movements Neck, shoulders, hips: None, normal, Overall Severity Severity of abnormal movements (highest score from questions above): None, normal Incapacitation due to abnormal movements: None, normal Patient's awareness of abnormal movements (rate only patient's report): No Awareness, Dental Status Current problems with teeth and/or dentures?: No Does patient usually wear dentures?: No  CIWA:    COWS:  COWS Total Score: 2  Musculoskeletal: Strength & Muscle Tone: within normal limits Gait & Station: normal Patient leans: N/A  Psychiatric Specialty Exam: Physical Exam  Nursing note and vitals reviewed. Constitutional: She is oriented to person, place, and time. She appears well-developed.  Cardiovascular: Normal rate.   Respiratory: Effort normal.  Neurological: She is alert and oriented to person, place, and time.  Psychiatric: She has a normal mood and affect. Her behavior is  normal.    Review of Systems  Neurological: Positive for headaches.  Psychiatric/Behavioral: Positive for depression and substance abuse. The patient is nervous/anxious.     Blood pressure (!) 131/98, pulse 66, temperature 97.8 F (36.6 C), temperature source Oral, resp. rate 16, height 5\' 7"  (1.702 m), weight 131.5 kg (290 lb), last menstrual period 11/01/2016.Body mass index is 45.42 kg/m.  General Appearance: Disheveled and Guarded  Eye Contact:  Fair  Speech:  Clear and Coherent  Volume:  Normal  Mood:  Anxious, Depressed and Dysphoric  Affect:  Appropriate and Congruent  Thought Process:  Coherent and Descriptions of Associations: Circumstantial  Orientation:  Full (Time, Place, and Person)  Thought Content:  Logical  Suicidal Thoughts:  Yes.  with intent/plan  Homicidal Thoughts:  No  Memory:  Immediate;   Fair Recent;   Fair Remote;   Fair  Judgement:  Fair  Insight:  Fair  Psychomotor Activity:  Normal  Concentration:  Concentration: Fair and Attention Span: Poor  Recall:  [employer] of Knowledge:  Fair  Language:  Good  Akathisia:  No  Handed:  Right  AIMS (if indicated):     Assets:  Communication Skills Desire for Improvement Resilience Social Support  ADL's:  Intact  Cognition:  WNL  Sleep:  Number of Hours: 6.25    I agree with current treatment plan on 11/10/2016, Patient seen face-to-face for psychiatric evaluation follow-up, chart reviewed. Reviewed the information documented and agree with the treatment plan.  Treatment Plan Summary: Daily contact with patient to assess and evaluate symptoms and progress in treatment and Medication management   Started Abilify 5mg  PO QAM for mood stabilization Continue with Gabapentin 100 mg PO BID for mood stabilization. Continue with Trazodone 100 mg for insomnia Initiated Topamax 25 mg Po PRN for migraine headaches  Started on CWIA/ Librium Protocol Will continue to monitor vitals ,medication compliance and  treatment side effects while patient  is here.  Reviewed labs: K level 3.1 ,BAL - 0, UDS - pos for cocaine, thc, opiates and amphetamines  Repeat CMP for K level on 11/12/2016 CSW will start working on disposition.  Patient to participate in therapeutic milieu  Laura Rackanika N Zeplin Aleshire, NP 11/10/2016, 1:58 PM

## 2016-11-10 NOTE — [facility] Group Notes (Signed)
[name] LCSW Group Therapy Note  11/10/2016  and  10:00 AM  Type of Therapy and Topic:  Group Therapy: Avoiding Self-Sabotaging and Enabling Behaviors  Participation Level:  Active  Participation Quality:  Attentive and Sharing  Affect:  Depressed  Cognitive:  Alert and Oriented  Insight:  Limited  Engagement in Therapy:  Limited   Therapeutic models used: Cognitive Behavioral Therapy,  Person-Centered Therapy and Motivational Interviewing  Modes of Intervention:  Discussion, Exploration, Orientation, Rapport Building, Socialization and Support  Summary of Progress/Problems:  The main focus of today's process group was for the patient to identify ways in which they have in the past sabotaged their own recovery. Motivational Interviewing was utilized to identify motivation they may have for wanting to change. Patient was distracting with side comments and desire to do hair for other patients. Patient is focused on discharge and feels her problems are not understandable by others; openly states she isolates and does not intend to change.    Carney Bernatherine C Karel Mowers, LCSW

## 2016-11-10 NOTE — [facility] Group Notes (Signed)
[name] Group Notes:  (Nursing/MHT/Case Management/Adjunct)  Date:  11/10/2016  Time:  2:57 PM  Type of Therapy:  Nurse Education  Participation Level:  Did Not Attend  Summary of Progress/Problems:  This was a healthy coping skills development group.   Almira Barenny G Tarrell Debes 11/10/2016, 2:57 PM

## 2016-11-10 NOTE — Progress Notes (Signed)
Data. Patient denies SI/HI/AVH. Patient has C/O severe migraine that has not been relieved with multiple medications and ice packs. Patient reports nausea related to migraine, as well. Patient's affect has been blunted with staff, but she has been noted with peers, laughing and joking. On her self assessment she reports 4/10 for depression, 5/10 for hopelessness and 6/10 for anxiety.  Patient interacting well with staff and other patients.  Action. Emotional support and encouragement offered. Education provided on medication, indications and side effect. Q 15 minute checks done for safety. Response. Safety on the unit maintained through 15 minute checks.  Medications taken as prescribed. Remained calm and appropriate through out shift.

## 2016-11-10 NOTE — Progress Notes (Signed)
Writer spoke with patient 1:1 and she c/o having a migraine most of the day with some relief from medications earlier. She was observed earlier at the nursing station at shift change singing. Writer inquired if she was singing earlier and she reported that she was singing for someone that requested a song. Writer informed her that it was a little too soon for medications and asked if she could possibly hold off until time. Writer suggested using distraction and possibly singing or interacting with peers in the dayroom. She was receptive and Clinical research associatewriter complimented her on her voice. Support given and safety maintained on unit with 15 min checks.

## 2016-11-10 NOTE — Progress Notes (Signed)
Patient did attend the evening speaker AA meeting.  

## 2016-11-11 MED ORDER — TOPIRAMATE 25 MG PO TABS
25.0000 mg | ORAL_TABLET | Freq: Once | ORAL | Status: AC
  Administered 2016-11-11: 25 mg via ORAL
  Filled 2016-11-11: qty 1

## 2016-11-11 NOTE — Progress Notes (Signed)
Data. Patient denies SI/HI/AVH. Patient has been very irritable this shift. She continues to report a headache, that is not managed with the medications ordered. NP aware and new orders received, patient continues to not respond to medications, ice packs or rest. Patient has been demanding of staff, making multiple, frequent requests and then complaining about, or criticizing staff. On her self assessment patient reports 3/10/for depression and hopelessness and 5/10 for anxiety. Patient also reports that the NP, "Told me that I can go home tomorrow. I need you all to understand that I need to be out of here before 9am." Patient was [facility], and how the doctor needs to assess her in the morning, before she can be discharged.  Action. Emotional support and encouragement offered. Education provided on medication, indications and side effect. Q 15 minute checks done for safety. Response. Safety on the unit maintained through 15 minute checks.  Medications taken as prescribed. Attended groups.

## 2016-11-11 NOTE — Progress Notes (Signed)
Patient did attend the evening speaker AA meeting.  

## 2016-11-11 NOTE — [facility] Group Notes (Signed)
[name] LCSW Group Therapy  11/11/2016 10 AM  Type of Therapy:  Group Therapy  Participation Level:  Active  Participation Quality:  Sharing  Affect:  Appropriate  Cognitive:  Alert and Oriented  Insight:  Improving  Engagement in Therapy:  Monopolizing  Modes of Intervention:  Education, Exploration, Rapport Building, Socialization and Support  Summary of Progress/Problems: Topic for today was thoughts and feelings regarding discharge. We discussed fears of upcoming changes including judgements, expectations and stigma of mental health issues. We then discussed supports: what constitutes a supportive framework, identification of supports and what to do when others are not supportive. Patients then identified a specific coping tool to use when others are not available. We then processed the quote: "A joy not shared is cut in half and a pain not shared is doubled." Patient can be louder than others thus it seems she can be getting the last word in frequently.   Laura Bernatherine C Rendell Thivierge, LCSW

## 2016-11-11 NOTE — Progress Notes (Signed)
[facility] MD Progress Note  11/11/2016 11:42 AM [name]  MRN:  [id number] Subjective: Patient reports " I feel ready to discharge. Reports I will follow-up at the shelter if my sister will not allow me to come to her house."  Objective: [name]  Seen attending group session.  patient reports she has already called the local shelter and the shelter located in [place]. Denies suicidal or homicidal ideation during this assessment. continues to endorse auditory hallucination denies command in nature. denies visual hallucination and does not appear to be responding to internal stimuli. Reports feeling better now that she has been restarted on her home medications. Reports chronic headache, however states pain is manageable with medication. Support, encouragement and reassurance was provided.   Principal Problem: Polysubstance abuse Diagnosis:   Patient Active Problem List   Diagnosis Date Noted  . Polysubstance (including opioids) dependence with physiological dependence (HCC) [F19.20] 11/08/2016  . Bipolar 1 disorder, depressed, severe (HCC) [F31.4] 11/08/2016  . Polysubstance abuse [F19.10] 11/08/2016  . Opiate withdrawal (HCC) [F11.23]   . Cocaine-induced mood disorder (HCC) [F14.94] 06/17/2015  . Cocaine dependence (HCC) [F14.20] 12/10/2014  . Polysubstance dependence (HCC) [F19.20] 12/10/2014  . Suicidal ideations [R45.851] 12/10/2014  . PTSD (post-traumatic stress disorder) [F43.10] 12/10/2014  . Anemia [D64.9] 03/08/2012  . Metrorrhagia [N92.1] 03/06/2012  . Dysmenorrhea [N94.6] 03/06/2012   Total Time spent with patient: 30 minutes  Past Psychiatric History:   Past Medical History:  Past Medical History:  Diagnosis Date  . Bipolar 1 disorder, depressed (HCC)   . Cocaine abuse   . Depression   . Diabetes mellitus without complication (HCC)   . Hep C w/o coma, chronic (HCC)   . Kidney stone   . Migraine   . Obesity   . Pregnancy induced hypertension   . PTSD  (post-traumatic stress disorder)   . Substance abuse   . Urinary tract infection   . UTI (lower urinary tract infection)     Past Surgical History:  Procedure Laterality Date  . kidney stones    . lipotripsy     Family History:  Family History  Problem Relation Age of Onset  . Rheum arthritis Mother   . Asthma Mother   . Hypertension Mother   . CAD Other   . Other Neg Hx    Family Psychiatric  History:  Social History:  History  Alcohol Use No    Comment: denied any alcohol use     History  Drug Use  . Types: Cocaine, Marijuana, IV, Heroin    Comment: Pt endorsed 1 gram of heroin and cocaine on 07/13/16    Social History   Social History  . Marital status: Legally Separated    Spouse name: N/A  . Number of children: N/A  . Years of education: N/A   Social History Main Topics  . Smoking status: Current Every Day Smoker    Packs/day: 1.00    Types: Cigarettes  . Smokeless tobacco: Never Used  . Alcohol use No     Comment: denied any alcohol use  . Drug use: Yes    Types: Cocaine, Marijuana, IV, Heroin     Comment: Pt endorsed 1 gram of heroin and cocaine on 07/13/16  . Sexual activity: Yes    Birth control/ protection: Injection   Other Topics Concern  . None   Social History Narrative  . None   Additional Social History:    Pain Medications: Pt abuses heroin   Prescriptions: NA  Over the Counter: See PTA Negative Consequences of Use: Financial, Personal relationships Withdrawal Symptoms: Anorexia, Irritability, Weakness, Agitation Name of Substance 1: Cocaine 1 - Age of First Use: unknown 1 - Amount (size/oz): 1 gram 1 - Frequency: daily  1 - Duration: past couple of weeks 1 - Last Use / Amount: 11/06/16 Name of Substance 2: Heroin 2 - Age of First Use: 28 years  2 - Amount (size/oz): 1- 1.5 grams 2 - Frequency: daily  2 - Duration: 2 years  2 - Last Use / Amount: 11/06/16                Sleep: Fair  Appetite:  Fair  Current  Medications: Current Facility-Administered Medications  Medication Dose Route Frequency Provider Last Rate Last Dose  . alum & mag hydroxide-simeth (MAALOX/MYLANTA) 200-200-20 MG/5ML suspension 30 mL  30 mL Oral Q4H PRN Withrow, John C, FNP      . cloNIDine (CATAPRES) tablet 0.1 mg  0.1 mg Oral BH-qamhs Withrow, John C, FNP   0.1 mg at 11/11/16 1016   Followed by  . [START ON 11/13/2016] cloNIDine (CATAPRES) tablet 0.1 mg  0.1 mg Oral QAC breakfast Withrow, John C, FNP      . dicyclomine (BENTYL) tablet 20 mg  20 mg Oral Q6H PRN Beau Fanny, FNP   20 mg at 11/09/16 0908  . feeding supplement (ENSURE ENLIVE) (ENSURE ENLIVE) liquid 237 mL  237 mL Oral BID PRN Cobos, Fernando A, MD      . gabapentin (NEURONTIN) capsule 100 mg  100 mg Oral BID Oneta Rack, NP   100 mg at 11/11/16 1015  . hydrOXYzine (ATARAX/VISTARIL) tablet 25 mg  25 mg Oral Q6H PRN Beau Fanny, FNP   25 mg at 11/11/16 1020  . loperamide (IMODIUM) capsule 2-4 mg  2-4 mg Oral PRN Withrow, John C, FNP      . magnesium hydroxide (MILK OF MAGNESIA) suspension 30 mL  30 mL Oral Daily PRN Withrow, John C, FNP      . methocarbamol (ROBAXIN) tablet 500 mg  500 mg Oral Q8H PRN Beau Fanny, FNP   500 mg at 11/11/16 1020  . multivitamin with minerals tablet 1 tablet  1 tablet Oral Daily Cobos, Rockey Situ, MD   1 tablet at 11/11/16 1016  . naproxen (NAPROSYN) tablet 500 mg  500 mg Oral BID PRN Beau Fanny, FNP   500 mg at 11/10/16 1713  . nicotine (NICODERM CQ - dosed in mg/24 hours) patch 21 mg  21 mg Transdermal Daily Laveda Abbe, NP   21 mg at 11/11/16 1020  . ondansetron (ZOFRAN-ODT) disintegrating tablet 4 mg  4 mg Oral Q6H PRN Beau Fanny, FNP   4 mg at 11/10/16 1608  . potassium chloride SA (K-DUR,KLOR-CON) CR tablet 20 mEq  20 mEq Oral BID Oneta Rack, NP   20 mEq at 11/11/16 1016  . topiramate (TOPAMAX) tablet 25 mg  25 mg Oral BID PRN Oneta Rack, NP   25 mg at 11/11/16 0323  . traZODone  (DESYREL) tablet 100 mg  100 mg Oral QHS PRN Oneta Rack, NP   100 mg at 11/10/16 2132    Lab Results: No results found for this or any previous visit (from the past 48 hour(s)).  Blood Alcohol level:  Lab Results  Component Value Date   [facility] <5 11/07/2016   ETH <5 07/15/2016    Metabolic Disorder Labs: Lab Results  Component  Value Date   HGBA1C 5.4 03/10/2016   MPG 108 03/10/2016   Lab Results  Component Value Date   PROLACTIN 17.7 03/10/2016   Lab Results  Component Value Date   CHOL 148 03/10/2016   TRIG 187 (H) 03/10/2016   HDL 31 (L) 03/10/2016   CHOLHDL 4.8 03/10/2016   VLDL 37 03/10/2016   LDLCALC 80 03/10/2016    Physical Findings: AIMS: Facial and Oral Movements Muscles of Facial Expression: None, normal Lips and Perioral Area: None, normal Jaw: None, normal Tongue: None, normal,Extremity Movements Upper (arms, wrists, hands, fingers): None, normal Lower (legs, knees, ankles, toes): None, normal, Trunk Movements Neck, shoulders, hips: None, normal, Overall Severity Severity of abnormal movements (highest score from questions above): None, normal Incapacitation due to abnormal movements: None, normal Patient's awareness of abnormal movements (rate only patient's report): No Awareness, Dental Status Current problems with teeth and/or dentures?: No Does patient usually wear dentures?: No  CIWA:    COWS:  COWS Total Score: 4  Musculoskeletal: Strength & Muscle Tone: within normal limits Gait & Station: normal Patient leans: N/A  Psychiatric Specialty Exam: Physical Exam  Nursing note and vitals reviewed. Constitutional: She is oriented to person, place, and time. She appears well-developed.  Cardiovascular: Normal rate.   Respiratory: Effort normal.  Neurological: She is alert and oriented to person, place, and time.  Psychiatric: She has a normal mood and affect. Her behavior is normal.    Review of Systems  Neurological: Positive for  headaches.  Psychiatric/Behavioral: Positive for depression and substance abuse. The patient is nervous/anxious.     Blood pressure 122/78, pulse 66, temperature 98 F (36.7 C), temperature source Oral, resp. rate 16, height 5\' 7"  (1.702 m), weight 131.5 kg (290 lb), last menstrual period 11/01/2016.Body mass index is 45.42 kg/m.  General Appearance: Casual  Eye Contact:  Fair  Speech:  Clear and Coherent  Volume:  Normal  Mood:  Euthymic  Affect:  Appropriate and Congruent  Thought Process:  Coherent and Goal Directed  Orientation:  Full (Time, Place, and Person)  Thought Content:  Logical  Suicidal Thoughts:  Yes.  with intent/plan  Homicidal Thoughts:  No  Memory:  Immediate;   Fair Recent;   Fair Remote;   Fair  Judgement:  Fair  Insight:  Fair  Psychomotor Activity:  Normal  Concentration:  Concentration: Fair and Attention Span: Poor  Recall:  [employer] of Knowledge:  Fair  Language:  Good  Akathisia:  No  Handed:  Right  AIMS (if indicated):     Assets:  Communication Skills Desire for Improvement Resilience Social Support  ADL's:  Intact  Cognition:  WNL  Sleep:  Number of Hours: 4.75    I agree with current treatment plan on 11/11/2016, Patient seen face-to-face for psychiatric evaluation follow-up, chart reviewed. Reviewed the information documented and agree with the treatment plan.  Treatment Plan Summary: Daily contact with patient to assess and evaluate symptoms and progress in treatment and Medication management   Continue Abilify 5mg  PO QAM for mood stabilization Continue with Gabapentin 100 mg PO BID for mood stabilization. Continue with Trazodone 100 mg for insomnia Contiune Topamax 25 mg Po PRN for migraine headaches  Started on CWIA/ Librium Protocol Will continue to monitor vitals ,medication compliance and treatment side effects while patient is here.  Reviewed labs: K level 3.1 ,BAL - 0, UDS - pos for cocaine, thc, opiates and amphetamines   Repeat CMP for K level on 11/12/2016 CSW will  start working on disposition.  Patient to participate in therapeutic milieu  Oneta Rackanika N Kataleena Holsapple, NP 11/11/2016, 11:42 AM

## 2016-11-11 NOTE — [facility] Group Notes (Signed)
[name] Group Notes:  Healthy Coping Skills   Date:  11/11/2016  Time:  3:53 PM  Type of Therapy:  Psychoeducational Skills  Participation Level:  Minimal  Participation Quality:  Appropriate  Affect:  Blunted  Cognitive:  Appropriate  Insight:  Limited  Engagement in Group:  Engaged  Modes of Intervention:  Discussion and Education  Summary of Progress/Problems: Patient attended group and was engaged.   Marzetta BoardDopson, Bettye Sitton E 11/11/2016, 3:53 PM

## 2016-11-12 DIAGNOSIS — F1420 Cocaine dependence, uncomplicated: Secondary | ICD-10-CM | POA: Diagnosis present

## 2016-11-12 DIAGNOSIS — F333 Major depressive disorder, recurrent, severe with psychotic symptoms: Secondary | ICD-10-CM | POA: Diagnosis present

## 2016-11-12 DIAGNOSIS — F1920 Other psychoactive substance dependence, uncomplicated: Secondary | ICD-10-CM

## 2016-11-12 DIAGNOSIS — F1120 Opioid dependence, uncomplicated: Secondary | ICD-10-CM | POA: Diagnosis present

## 2016-11-12 DIAGNOSIS — F1220 Cannabis dependence, uncomplicated: Secondary | ICD-10-CM | POA: Diagnosis present

## 2016-11-12 LAB — COMPREHENSIVE METABOLIC PANEL
ALBUMIN: 3.5 g/dL (ref 3.5–5.0)
ALT: 17 U/L (ref 14–54)
AST: 12 U/L — ABNORMAL LOW (ref 15–41)
Alkaline Phosphatase: 54 U/L (ref 38–126)
Anion gap: 6 (ref 5–15)
BUN: 14 mg/dL (ref 6–20)
CO2: 28 mmol/L (ref 22–32)
Calcium: 9 mg/dL (ref 8.9–10.3)
Chloride: 105 mmol/L (ref 101–111)
Creatinine, Ser: 0.99 mg/dL (ref 0.44–1.00)
GFR calc Af Amer: >60 mL/min (ref >60)
GFR calc non Af Amer: >60 mL/min (ref >60)
GLUCOSE: 104 mg/dL — AB (ref 65–99)
POTASSIUM: 3.9 mmol/L (ref 3.5–5.1)
SODIUM: 139 mmol/L (ref 135–145)
TOTAL PROTEIN: 7.1 g/dL (ref 6.5–8.1)
Total Bilirubin: 0.4 mg/dL (ref 0.3–1.2)

## 2016-11-12 MED ORDER — GABAPENTIN 100 MG PO CAPS: 100.0000 mg | ORAL_CAPSULE | Freq: Two times a day (BID) | ORAL | 0 refills | Status: AC

## 2016-11-12 NOTE — [facility] Suicide Risk Assessment (Signed)
[facility]   Principal Problem: Major depressive disorder, recurrent episode, severe, with psychosis (HCC) Discharge Diagnoses:  Patient Active Problem List   Diagnosis Date Noted  . Cocaine use disorder, moderate, dependence (HCC) [F14.20] 11/12/2016  . Cannabis use disorder, moderate, dependence (HCC) [F12.20] 11/12/2016  . Opioid use disorder, moderate, dependence (HCC) [F11.20] 11/12/2016  . Major depressive disorder, recurrent episode, severe, with psychosis (HCC) [F33.3] 11/12/2016  . Polysubstance (including opioids) dependence with physiological dependence (HCC) [F19.20] 11/08/2016  . Bipolar 1 disorder, depressed, severe (HCC) [F31.4] 11/08/2016  . Opiate withdrawal (HCC) [F11.23]   . Cocaine-induced mood disorder (HCC) [F14.94] 06/17/2015  . Cocaine dependence (HCC) [F14.20] 12/10/2014  . Polysubstance dependence (HCC) [F19.20] 12/10/2014  . Suicidal ideations [R45.851] 12/10/2014  . PTSD (post-traumatic stress disorder) [F43.10] 12/10/2014  . Anemia [D64.9] 03/08/2012  . Metrorrhagia [N92.1] 03/06/2012  . Dysmenorrhea [N94.6] 03/06/2012    Total Time spent with patient: 30 minutes  Musculoskeletal: Strength & Muscle Tone: within normal limits Gait & Station: normal Patient leans: N/A  Psychiatric Specialty Exam: Review of Systems  Psychiatric/Behavioral: Negative for suicidal ideas.  All other systems reviewed and are negative.   Blood pressure 127/85, pulse 70, temperature 98.1 F (36.7 C), temperature source Oral, resp. rate 16, height 5\' 7"  (1.702 m), weight 131.5 kg (290 lb), last menstrual period 11/01/2016.Body mass index is 45.42 kg/m.  General Appearance: Casual  Eye Contact::  Fair  Speech:  Clear and Coherent409  Volume:  Normal  Mood:  Euthymic  Affect:  Appropriate  Thought Process:  Goal Directed and Descriptions of Associations: Intact  Orientation:  Full (Time, Place, and Person)  Thought Content:  Logical  Suicidal  Thoughts:  No  Homicidal Thoughts:  No  Memory:  Immediate;   Fair Recent;   Fair Remote;   Fair  Judgement:  Fair  Insight:  Fair  Psychomotor Activity:  Normal  Concentration:  Fair  Recall:  [employer]  Fund of Knowledge:Fair  Language: Fair  Akathisia:  No  Handed:  Right  AIMS (if indicated):     Assets:  Communication Skills Desire for Improvement  Sleep:  Number of Hours: 5.75  Cognition: WNL  ADL's:  Intact   Mental Status Per Nursing Assessment::   On Admission:  NA  Demographic Factors:  Caucasian  Loss Factors: NA  Historical Factors: Impulsivity  Risk Reduction Factors:   Positive social support and Positive therapeutic relationship  Continued Clinical Symptoms:  Alcohol/Substance Abuse/Dependencies Previous Psychiatric Diagnoses and Treatments  Cognitive Features That Contribute To Risk:  None    Suicide Risk:  Minimal: No identifiable suicidal ideation.  Patients presenting with no risk factors but with morbid ruminations; may be classified as minimal risk based on the severity of the depressive symptoms  Follow-up Information    [facility] Follow up.   Specialty:  Addiction Medicine Why:  Referral faxed: 11/09/16. If you are interested in following up with this treatment program, please call Shayla in admissions daily to check status of waitlist and referral.  Contact information: [address] [place] [address] [phone]        [facility] Follow up.   Why:  Referral faxed: 11/09/16 Contact information: [address] [place], [place] [address] Phone: [phone] Fax: [phone]       [facility] Follow up.   Contact information: [address] [place], [place] [address] Phone: [phone] Fax: [phone]  Plan Of Care/Follow-up recommendations:  Activity:  no restrictions Diet:  regular Tests:  as needed Other:  follow up with  aftercare  Leveta Wahab, MD 11/12/2016, 9:31 AM

## 2016-11-12 NOTE — Progress Notes (Signed)
Discharge note: Pt denies SI/HI. Pt received both written and verbal discharge instructions. Pt verbalizes understanding of discharge instructions. Pt agreed to f/u appt and med regimen. Pt received SRA, AVS, transitional record and belongings from room and locker. Pt declined prescription for Neurontin, stating that she already have an active prescription for the same med. Pt safely discharged to lobby.

## 2016-11-12 NOTE — Progress Notes (Signed)
Recreation Therapy Notes  Date: 11/12/16 Time: 0930 Location: [address] Group Room  Group Topic: Stress Management  Goal Area(s) Addresses:  Patient will verbalize importance of using healthy stress management.  Patient will identify positive emotions associated with healthy stress management.   Intervention: Stress Management  Activity :  LRT introduced the stress management technique of meditation.  LRT played a meditation on gratitude to allow patients to engage in the meditation.  Patients were to follow along as the meditation played to fully participate in the activity.   Education:  Stress Management, Discharge Planning.   Education Outcome: Acknowledges edcuation/In group clarification offered/Needs additional education  Clinical Observations/Feedback: Pt did not attend group.   Laura RancherMarjette Skyllar [name], LRT/CTRS         Lillia AbedLindsay, Darleny Sem A 11/12/2016 11:50 AM
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
## 2016-11-12 NOTE — Discharge Summary (Signed)
Physician Discharge Summary Note  Patient:  [name] is an 31 y.o., female MRN:  [id number] DOB:  1985-07-14 Patient phone:  [phone] (home)  Patient address:   [address] [place] [address],  Total Time spent with patient: 30 minutes  Date of Admission:  11/08/2016 Date of Discharge: 11/12/2016  Reason for Admission:Per HPI- ZABRINA BROTHERTON is a 31 year old female with a history of bipolar disorder and PTSD in addition to substance abuse. She has had an increase in illicit drug use, and discontinued psychiatric medications. Recent stressors include fianc's death in 2017-09-26and a stillborn infant approximately one month ago.  Patient reports that She has had a difficult few months between the passing of her child, and her husband's death. She reports that she relapsed on opiates and heroin proximally 2 weeks ago. She reports that she has been off of her Effexor and Abilify. She continues to struggle with bad mood, tearfulness, depression.  She is hopeful for restarting antidepressants in being able to get back into a substance treatment program. She has suicidal thoughts but reports that she can keep herself safe while in hospital. She agrees to restart Effexor and Abilify. She will continue to [place] with social work for disposition, as she is currently homeless.  Principal Problem: Major depressive disorder, recurrent episode, severe, with psychosis [facility]) Discharge Diagnoses: Patient Active Problem List   Diagnosis Date Noted  . Cocaine use disorder, moderate, dependence (HCC) [F14.20] 11/12/2016  . Cannabis use disorder, moderate, dependence (HCC) [F12.20] 11/12/2016  . Opioid use disorder, moderate, dependence (HCC) [F11.20] 11/12/2016  . Major depressive disorder, recurrent episode, severe, with psychosis (HCC) [F33.3] 11/12/2016  . Polysubstance (including opioids) dependence with physiological dependence (HCC) [F19.20] 11/08/2016  . Bipolar 1  disorder, depressed, severe (HCC) [F31.4] 11/08/2016  . Opiate withdrawal (HCC) [F11.23]   . Cocaine-induced mood disorder (HCC) [F14.94] 06/17/2015  . Cocaine dependence (HCC) [F14.20] 12/10/2014  . Polysubstance dependence (HCC) [F19.20] 12/10/2014  . Suicidal ideations [R45.851] 12/10/2014  . PTSD (post-traumatic stress disorder) [F43.10] 12/10/2014  . Anemia [D64.9] 03/08/2012  . Metrorrhagia [N92.1] 03/06/2012  . Dysmenorrhea [N94.6] 03/06/2012    Past Psychiatric History:  Past Medical History:  Past Medical History:  Diagnosis Date  . Bipolar 1 disorder, depressed (HCC)   . Cocaine abuse   . Depression   . Diabetes mellitus without complication (HCC)   . Hep C w/o coma, chronic (HCC)   . Kidney stone   . Migraine   . Obesity   . Pregnancy induced hypertension   . PTSD (post-traumatic stress disorder)   . Substance abuse   . Urinary tract infection   . UTI (lower urinary tract infection)     Past Surgical History:  Procedure Laterality Date  . kidney stones    . lipotripsy     Family History:  Family History  Problem Relation Age of Onset  . Rheum arthritis Mother   . Asthma Mother   . Hypertension Mother   . CAD Other   . Other Neg Hx    Family Psychiatric  History:  Social History:  History  Alcohol Use No    Comment: denied any alcohol use     History  Drug Use  . Types: Cocaine, Marijuana, IV, Heroin    Comment: Pt endorsed 1 gram of heroin and cocaine on 07/13/16    Social History   Social History  . Marital status: Legally Separated    Spouse name: N/A  . Number of  children: N/A  . Years of education: N/A   Social History Main Topics  . Smoking status: Current Every Day Smoker    Packs/day: 1.00    Types: Cigarettes  . Smokeless tobacco: Never Used  . Alcohol use No     Comment: denied any alcohol use  . Drug use: Yes    Types: Cocaine, Marijuana, IV, Heroin     Comment: Pt endorsed 1 gram of heroin and cocaine on 07/13/16  . Sexual  activity: Yes    Birth control/ protection: Injection   Other Topics Concern  . None   Social History Narrative  . None    Hospital Course:  LURLEAN KERNEN was admitted for Major depressive disorder, recurrent episode, severe, with psychosis (HCC) and crisis management.  Pt was treated discharged with the medications listed below under Medication List.  Medical problems were identified and treated as needed.  Home medications were restarted as appropriate.  Improvement was monitored by observation and [name] 's daily report of symptom reduction.  Emotional and mental status was monitored by daily self-inventory reports completed by [name] and clinical staff.         LILLIA LENGEL was evaluated by the treatment team for stability and plans for continued recovery upon discharge. CYSTAL SHANNAHAN 's motivation was an integral factor for scheduling further treatment. Employment, transportation, bed availability, health status, family support, and any pending legal issues were also considered during hospital stay. Pt was offered further treatment options upon discharge including but not limited to Residential, Intensive Outpatient, and Outpatient treatment.  SHYNIA DALEO will follow up with the services as listed below under Follow Up Information.     Upon completion of this admission the patient was both mentally and medically stable for discharge denying suicidal/homicidal ideation, auditory/visual/tactile hallucinations, delusional thoughts and paranoia. Patient  to follow-up with PCP for concerns with chronic headaches.    [name] responded well to treatment with Neurontin 100mg  and  Trazodone 100 mg without adverse effects. Pt demonstrated improvement without reported or observed adverse effects to the point of stability appropriate for outpatient management. Pertinent labs include: CMP,  for which outpatient follow-up is necessary for lab recheck as mentioned below.  Reviewed CBC, CMP, BAL, and UDS; all unremarkable aside from noted exceptions.   Physical Findings: AIMS: Facial and Oral Movements Muscles of Facial Expression: None, normal Lips and Perioral Area: None, normal Jaw: None, normal Tongue: None, normal,Extremity Movements Upper (arms, wrists, hands, fingers): None, normal Lower (legs, knees, ankles, toes): None, normal, Trunk Movements Neck, shoulders, hips: None, normal, Overall Severity Severity of abnormal movements (highest score from questions above): None, normal Incapacitation due to abnormal movements: None, normal Patient's awareness of abnormal movements (rate only patient's report): No Awareness, Dental Status Current problems with teeth and/or dentures?: No Does patient usually wear dentures?: No  CIWA:    COWS:  COWS Total Score: 0  Musculoskeletal: Strength & Muscle Tone: within normal limits Gait & Station: normal Patient leans: N/A  Psychiatric Specialty Exam: See SRA BY MD Physical Exam  Vitals reviewed. Constitutional: She is oriented to person, place, and time. She appears well-developed.  Cardiovascular: Normal rate.   Neurological: She is alert and oriented to person, place, and time.  Psychiatric: She has a normal mood and affect. Her behavior is normal.    Review of Systems  Psychiatric/Behavioral: Negative for depression (stable). The patient is not nervous/anxious (stable).     Blood pressure  127/85, pulse 70, temperature 98.1 F (36.7 C), temperature source Oral, resp. rate 16, height 5\' 7"  (1.702 m), weight 131.5 kg (290 lb), last menstrual period 11/01/2016.Body mass index is 45.42 kg/m.    Have you used any form of tobacco in the last 30 days? (Cigarettes, Smokeless Tobacco, Cigars, and/or Pipes): Yes  Has this patient used any form of tobacco in the last 30 days? (Cigarettes, Smokeless Tobacco, Cigars, and/or Pipes) Yes, Yes, A prescription for an FDA-approved tobacco cessation medication was  offered at discharge and the patient refused  Blood Alcohol level:  Lab Results  Component Value Date   [facility] <5 11/07/2016   ETH <5 07/15/2016    Metabolic Disorder Labs:  Lab Results  Component Value Date   HGBA1C 5.4 03/10/2016   MPG 108 03/10/2016   Lab Results  Component Value Date   PROLACTIN 17.7 03/10/2016   Lab Results  Component Value Date   CHOL 148 03/10/2016   TRIG 187 (H) 03/10/2016   HDL 31 (L) 03/10/2016   CHOLHDL 4.8 03/10/2016   VLDL 37 03/10/2016   LDLCALC 80 03/10/2016    See Psychiatric Specialty Exam and Suicide Risk Assessment completed by Attending Physician prior to discharge.  Discharge destination:  Home  Is patient on multiple antipsychotic therapies at discharge:  No   Has Patient had three or more failed trials of antipsychotic monotherapy by history:  No  Recommended Plan for Multiple Antipsychotic Therapies: NA  Discharge Instructions    Diet - low sodium heart healthy    Complete by:  As directed    Discharge instructions    Complete by:  As directed    Take all medications as prescribed. Keep all follow-up appointments as scheduled.  Do not consume alcohol or use illegal drugs while on prescription medications. Report any adverse effects from your medications to your primary care provider promptly.  In the event of recurrent symptoms or worsening symptoms, call 911, a crisis hotline, or go to the nearest emergency department for evaluation.   Increase activity slowly    Complete by:  As directed      Allergies as of 11/12/2016      Reactions   Acetaminophen Nausea And Vomiting, Anaphylaxis   Has tolerated percocet in the past   Darvocet [propoxyphene N-acetaminophen] Hives, Nausea And Vomiting   Morphine And Related Itching   Needs benadryl prior to  administration-tolerates, yet pt OK with Tramadol   Sulfa Antibiotics Hives   Yet OK with Glimepiride    Tomato Hives   Pioglitazone Swelling   Swelling of hands, legs and feet     Tramadol Hives      Medication List    STOP taking these medications   magic mouthwash w/lidocaine Soln   nicotine polacrilex 2 MG gum Commonly known as:  NICORETTE   penicillin v potassium 500 MG tablet Commonly known as:  VEETID   SUBOXONE 8-2 MG Film Generic drug:  Buprenorphine HCl-Naloxone HCl     TAKE these medications     Indication  gabapentin 100 MG capsule Commonly known as:  NEURONTIN Take 1 capsule (100 mg total) by mouth 2 (two) times daily. What changed:  medication strength  how much to take  when to take this  Indication:  Agitation, Alcohol Withdrawal Syndrome      Follow-up Information    [facility] Follow up.   Specialty:  Addiction Medicine Why:  Referral faxed: 11/09/16. If you are interested in following up  with this treatment program, please call Shayla in admissions daily to check status of waitlist and referral.  Contact information: [address] [place] [address] [phone]        [facility] Follow up.   Why:  Referral faxed: 11/09/16 Contact information: [address] [place], [place] [address] Phone: [phone] Fax: [phone]       [facility] Follow up.   Contact information: [address] [place], [place] [address] Phone: [phone] Fax: [phone]          Follow-up recommendations:  Activity:  as toelrated Diet:  heart health  Comments: Take all medications as prescribed. Keep all follow-up appointments as scheduled.  Do not consume alcohol or use illegal drugs while on prescription medications. Report any adverse effects from your medications to your primary care provider promptly.  In the event of recurrent symptoms or worsening symptoms, call 911, a crisis hotline, or go to the nearest emergency department for evaluation.   Signed: Oneta Rackanika N Tyonna Talerico, NP 11/12/2016, 9:35 AM

## 2016-11-12 NOTE — Progress Notes (Signed)
  [facility] :  Will you be returning to the same living situation after discharge:  Yes,  home with dad At discharge, do you have transportation home?: Yes,  dad  Do you have the ability to pay for your medications: Yes,  [facility]  Release of information consent forms completed and submitted to medical records by CSW.  Patient to Follow up at: Follow-up Information    [facility] Follow up.   Specialty:  Addiction Medicine Why:  Referral faxed: 11/09/16. If you are interested in following up with this treatment program, please call Shayla in admissions daily to check status of waitlist and referral.  Contact information: [address] [place] [address] [phone]        [facility] Follow up.   Why:  Please follow-up with Marylene Landngela in admissions if interested in pursuing this treatment facility. Thank you.  Contact information: [address] [place], [place] [address] Phone: [phone] Fax: [phone]       [facility] Follow up on 11/13/2016.   Why:  Please go to agency [facility] at 11:00 for hospital follow-up/medication management. Thank you.  Contact information: [address] [place] , [place] [address] Phone: [phone] Fax: [phone]          Next level of care provider has access to [facility] Link:no  Safety Planning and Suicide Prevention discussed: Yes,  SPE completed with pt's mother. SPI pamphlet and Mobile Crisis information provided.  Have you used any form of tobacco in the last 30 days? (Cigarettes, Smokeless Tobacco, Cigars, and/or Pipes): Yes  Has patient been referred to the Quitline?: Patient refused referral  Patient has been referred for addiction treatment: Yes  Sulma Ruffino N Smart LCSW 11/12/2016, 9:43 AM

## 2016-11-12 NOTE — Progress Notes (Signed)
Wrioter has observed patient sitting and visiting with other peers. She has been observed laughing and talking with them. She requested from writer all the prns should could get and Clinical research associatewriter encouraged her to wait until after group. She was receptive. She reported to Clinical research associatewriter and has been telling peers that she is discharging on tomorrow by 9 am. Writer informed her that this was not information given in report. She was encouraged to speak to her doctor on tomorrow concerning discharge. Support given and safety maintained on unit with 15 min checks.

## 2022-04-18 ENCOUNTER — Telehealth: Payer: Medicaid Other | Admitting: Physician Assistant

## 2022-04-18 DIAGNOSIS — B9689 Other specified bacterial agents as the cause of diseases classified elsewhere: Secondary | ICD-10-CM

## 2022-04-18 DIAGNOSIS — J019 Acute sinusitis, unspecified: Secondary | ICD-10-CM

## 2022-04-18 MED ORDER — AMOXICILLIN-POT CLAVULANATE 875-125 MG PO TABS: 1.0000 | ORAL_TABLET | Freq: Two times a day (BID) | ORAL | 0 refills | Status: AC

## 2022-04-18 NOTE — Progress Notes (Signed)
Virtual Visit Consent   [name], you are scheduled for a virtual visit with a [facility] provider today. Just as with appointments in the office, your consent must be obtained to participate. Your consent will be active for this visit and any virtual visit you may have with one of our providers in the next 365 days. If you have a MyChart account, a copy of this consent can be sent to you electronically.  As this is a virtual visit, video technology does not allow for your provider to perform a traditional examination. This may limit your provider's ability to fully assess your condition. If your provider identifies any concerns that need to be evaluated in person or the need to arrange testing (such as labs, EKG, etc.), we will make arrangements to do so. Although advances in technology are sophisticated, we cannot ensure that it will always work on either your end or our end. If the connection with a video visit is poor, the visit may have to be switched to a telephone visit. With either a video or telephone visit, we are not always able to ensure that we have a secure connection.  By engaging in this virtual visit, you consent to the provision of healthcare and authorize for your insurance to be billed (if applicable) for the services provided during this visit. Depending on your insurance coverage, you may receive a charge related to this service.  I need to obtain your verbal consent now. Are you willing to proceed with your visit today? [name] has provided verbal consent on 04/18/2022 for a virtual visit (video or telephone). Laura Loveless, PA-C  Date: 04/18/2022 8:23 AM  Virtual Visit via Video Note   I, [name], connected with  [name]  ([id number], December 03, 1985) on 04/18/22 at  8:15 AM EST by a video-enabled telemedicine application and verified that I am speaking with the correct person using two identifiers.  Location: Patient: Virtual Visit Location  Patient: Home Provider: Virtual Visit Location Provider: Home Office   I discussed the limitations of evaluation and management by telemedicine and the availability of in person appointments. The patient expressed understanding and agreed to proceed.    History of Present Illness: [name] is a 36 y.o. who identifies as a female who was assigned female at birth, and is being seen today for sore throat.  HPI: Sore Throat  This is a new problem. The current episode started in the past 7 days. The problem has been gradually worsening. There has been no fever. The pain is moderate. Associated symptoms include congestion, coughing, ear pain (right ear) and headaches. Pertinent negatives include no ear discharge, hoarse voice, swollen glands or trouble swallowing. Associated symptoms comments: Sinus pain and pressure. She has had no exposure to strep or mono. Treatments tried: dayquil. The treatment provided no relief.   Covid 19 at home test is negative.   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   Cocaine dependence (HCC) 12/10/2014   Polysubstance dependence (HCC) 12/10/2014  Suicidal ideations 12/10/2014   PTSD (post-traumatic stress disorder) 12/10/2014   Anemia 03/08/2012   Metrorrhagia 03/06/2012   Dysmenorrhea 03/06/2012    Allergies:  Allergies  Allergen Reactions   Acetaminophen Nausea And Vomiting and Anaphylaxis    Has tolerated percocet in the past    Darvocet [Propoxyphene N-Acetaminophen] Hives and Nausea And Vomiting   Morphine And Related Itching    Needs benadryl prior to  administration-tolerates, yet pt OK with Tramadol   Sulfa Antibiotics Hives    Yet OK with Glimepiride    Tomato Hives   Pioglitazone Swelling    Swelling of hands, legs and feet    Tramadol Hives   Medications:  Current Outpatient Medications:    amoxicillin-clavulanate (AUGMENTIN) 875-125 MG tablet, Take 1 tablet by mouth 2 (two) times daily., Disp: 14 tablet, Rfl: 0   gabapentin (NEURONTIN) 100 MG capsule, Take 1 capsule (100 mg total) by mouth 2 (two) times daily., Disp: 60 capsule, Rfl: 0  Observations/Objective: Patient is well-developed, well-nourished in no acute distress.  Resting comfortably at home.  Head is normocephalic, atraumatic.  No labored breathing.  Speech is clear and coherent with logical content.  Patient is alert and oriented at baseline.    Assessment and Plan: 1. Acute bacterial sinusitis - amoxicillin-clavulanate (AUGMENTIN) 875-125 MG tablet; Take 1 tablet by mouth 2 (two) times daily.  Dispense: 14 tablet; Refill: 0  - Worsening symptoms that have not responded to OTC medications.  - Will give Augmentin - Continue allergy medications.  - Steam and humidifier can help - Stay well hydrated and get plenty of rest.  - Seek in person evaluation if no symptom improvement or if symptoms worsen   Follow Up Instructions: I discussed the assessment and treatment plan with the patient. The patient was provided an opportunity to ask questions and all were answered. The patient agreed with the plan and demonstrated an understanding of the instructions.  A copy of instructions were sent to the patient via MyChart unless otherwise noted below.    The patient was advised to call back or seek an in-person evaluation if the symptoms worsen or if the condition fails to improve as anticipated.  Time:  I spent 8 minutes with the patient  via telehealth technology discussing the above problems/concerns.    Laura Loveless, PA-C

## 2022-04-18 NOTE — Patient Instructions (Signed)
[name], thank you for joining [name], PA-C for today's virtual visit.  While this provider is not your primary care provider (PCP), if your PCP is located in our provider database this encounter information will be shared with them immediately following your visit.   A [facility] MyChart account gives you access to today's visit and all your visits, tests, and labs performed at [facility] " click here if you don't have a [facility] MyChart account or go to [email or web address]  Consent: (Patient) [name] provided verbal consent for this virtual visit at the beginning of the encounter.  Current Medications:  Current Outpatient Medications:    amoxicillin-clavulanate (AUGMENTIN) 875-125 MG tablet, Take 1 tablet by mouth 2 (two) times daily., Disp: 14 tablet, Rfl: 0   gabapentin (NEURONTIN) 100 MG capsule, Take 1 capsule (100 mg total) by mouth 2 (two) times daily., Disp: 60 capsule, Rfl: 0   Medications ordered in this encounter:  Meds ordered this encounter  Medications   amoxicillin-clavulanate (AUGMENTIN) 875-125 MG tablet    Sig: Take 1 tablet by mouth 2 (two) times daily.    Dispense:  14 tablet    Refill:  0    Order Specific Question:   Supervising Provider    Answer:   Merrilee Jansky [id number]     *If you need refills on other medications prior to your next appointment, please contact your pharmacy*  Follow-Up: Call back or seek an in-person evaluation if the symptoms worsen or if the condition fails to improve as anticipated.  [facility] [phone]  Other Instructions  Sinus Infection, Adult A sinus infection, also called sinusitis, is inflammation of your sinuses. Sinuses are hollow spaces in the bones around your face. Your sinuses are located: Around your eyes. In the middle of your forehead. Behind your nose. In your cheekbones. Mucus normally drains out of your sinuses. When your nasal tissues  become inflamed or swollen, mucus can become trapped or blocked. This allows bacteria, viruses, and fungi to grow, which leads to infection. Most infections of the sinuses are caused by a virus. A sinus infection can develop quickly. It can last for up to 4 weeks (acute) or for more than 12 weeks (chronic). A sinus infection often develops after a cold. What are the causes? This condition is caused by anything that creates swelling in the sinuses or stops mucus from draining. This includes: Allergies. Asthma. Infection from bacteria or viruses. Deformities or blockages in your nose or sinuses. Abnormal growths in the nose (nasal polyps). Pollutants, such as chemicals or irritants in the air. Infection from fungi. This is rare. What increases the risk? You are more likely to develop this condition if you: Have a weak body defense system (immune system). Do a lot of swimming or diving. Overuse nasal sprays. Smoke. What are the signs or symptoms? The main symptoms of this condition are pain and a feeling of pressure around the affected sinuses. Other symptoms include: Stuffy nose or congestion that makes it difficult to breathe through your nose. Thick yellow or greenish drainage from your nose. Tenderness, swelling, and warmth over the affected sinuses. A cough that may get worse at night. Decreased sense of smell and taste. Extra mucus that collects in the throat or the back of the nose (postnasal drip) causing a sore throat or bad breath. Tiredness (fatigue). Fever. How is this diagnosed? This condition is diagnosed based on: Your symptoms. Your medical history. A physical  exam. Tests to find out if your condition is acute or chronic. This may include: Checking your nose for nasal polyps. Viewing your sinuses using a device that has a light (endoscope). Testing for allergies or bacteria. Imaging tests, such as an MRI or CT scan. In rare cases, a bone biopsy may be done to rule  out more serious types of fungal sinus disease. How is this treated? Treatment for a sinus infection depends on the cause and whether your condition is chronic or acute. If caused by a virus, your symptoms should go away on their own within 10 days. You may be given medicines to relieve symptoms. They include: Medicines that shrink swollen nasal passages (decongestants). A spray that eases inflammation of the nostrils (topical intranasal corticosteroids). Rinses that help get rid of thick mucus in your nose (nasal saline washes). Medicines that treat allergies (antihistamines). Over-the-counter pain relievers. If caused by bacteria, your health care provider may recommend waiting to see if your symptoms improve. Most bacterial infections will get better without antibiotic medicine. You may be given antibiotics if you have: A severe infection. A weak immune system. If caused by narrow nasal passages or nasal polyps, surgery may be needed. Follow these instructions at home: Medicines Take, use, or apply over-the-counter and prescription medicines only as told by your health care provider. These may include nasal sprays. If you were prescribed an antibiotic medicine, take it as told by your health care provider. Do not stop taking the antibiotic even if you start to feel better. Hydrate and humidify  Drink enough fluid to keep your urine pale yellow. Staying hydrated will help to thin your mucus. Use a cool mist humidifier to keep the humidity level in your home above 50%. Inhale steam for 10-15 minutes, 3-4 times a day, or as told by your health care provider. You can do this in the bathroom while a hot shower is running. Limit your exposure to cool or dry air. Rest Rest as much as possible. Sleep with your head raised (elevated). Make sure you get enough sleep each night. General instructions  Apply a warm, moist washcloth to your face 3-4 times a day or as told by your health care  provider. This will help with discomfort. Use nasal saline washes as often as told by your health care provider. Wash your hands often with soap and water to reduce your exposure to germs. If soap and water are not available, use hand sanitizer. Do not smoke. Avoid being around people who are smoking (secondhand smoke). Keep all follow-up visits. This is important. Contact a health care provider if: You have a fever. Your symptoms get worse. Your symptoms do not improve within 10 days. Get help right away if: You have a severe headache. You have persistent vomiting. You have severe pain or swelling around your face or eyes. You have vision problems. You develop confusion. Your neck is stiff. You have trouble breathing. These symptoms may be an emergency. Get help right away. Call 911. Do not wait to see if the symptoms will go away. Do not drive yourself to the hospital. Summary A sinus infection is soreness and inflammation of your sinuses. Sinuses are hollow spaces in the bones around your face. This condition is caused by nasal tissues that become inflamed or swollen. The swelling traps or blocks the flow of mucus. This allows bacteria, viruses, and fungi to grow, which leads to infection. If you were prescribed an antibiotic medicine, take it as told by  your health care provider. Do not stop taking the antibiotic even if you start to feel better. Keep all follow-up visits. This is important. This information is not intended to replace advice given to you by your health care provider. Make sure you discuss any questions you have with your health care provider. Document Revised: 05/02/2021 Document Reviewed: 05/02/2021 Elsevier Patient Education  2023 Elsevier Inc.    If you have been instructed to have an in-person evaluation today at a local Urgent Care facility, please use the link below. It will take you to a list of all of our available [facility], including  address, phone number and hours of operation. Please do not delay care.  [facility]  If you or a family member do not have a primary care provider, use the link below to schedule a visit and establish care. When you choose a [facility] primary care physician or advanced practice provider, you gain a long-term partner in health. Find a Primary Care Provider  Learn more about [facility]'s in-office and virtual care options: [facility] - Get Care Now

## 2022-04-27 ENCOUNTER — Ambulatory Visit (HOSPITAL_COMMUNITY): Payer: Medicaid Other

## 2022-04-27 ENCOUNTER — Telehealth: Payer: Medicaid Other | Admitting: Physician Assistant

## 2022-04-27 DIAGNOSIS — J329 Chronic sinusitis, unspecified: Secondary | ICD-10-CM

## 2022-04-27 NOTE — Progress Notes (Signed)
Because you have failed a first line treatment, or are having a recurrence of symptoms following a first line treatment, I feel your condition warrants further evaluation and I recommend that you be seen in a face to face visit.   NOTE: There will be NO CHARGE for this eVisit   If you are having a true medical emergency please call 911.      For an urgent face to face visit, [facility] has seven urgent care centers for your convenience:     [facility] Health Urgent Care Center at [facility] Directions [phone] [address] [place], [place] [address]    [facility] Health Urgent Care Center [facility]) Get Driving Directions [phone] [address] [place], [place] [address]  [facility] Health Urgent Care Center [facility]) Get Driving Directions [phone] [address] [place],  [place]  [address]  [facility] Health Urgent Care Center [facility] - at [employer] Directions  [phone] [address] [place],  [place] [address]   [facility] Health Urgent Care at [facility] Get Driving Directions [phone] [address] [place] [address] [place], [place] [address]   [facility] Health Urgent Care at [facility] Get Driving Directions  [phone] [address] [place], [place] [address]   [facility] Health Urgent Care at [facility] Directions [phone] [address] [place], [place] [address]  Your MyChart E-visit questionnaire answers were reviewed by a board certified advanced clinical practitioner to complete your personal care plan based on your specific symptoms.  Thank you for using e-Visits.   I have spent 5 minutes in review of e-visit questionnaire, review and updating patient chart, medical decision making and response to patient.   [name], PA-C

## 2022-04-28 ENCOUNTER — Encounter (HOSPITAL_COMMUNITY): Payer: Self-pay

## 2022-04-28 ENCOUNTER — Ambulatory Visit (HOSPITAL_COMMUNITY)
Admission: RE | Admit: 2022-04-28 | Discharge: 2022-04-28 | Discharge status: Against Medical Advice | Payer: Medicaid Other | Source: Ambulatory Visit | Attending: Specialist | Admitting: Specialist

## 2022-04-28 DIAGNOSIS — Z5321 Procedure and treatment not carried out due to patient leaving prior to being seen by health care provider: Secondary | ICD-10-CM

## 2022-04-28 NOTE — ED Notes (Signed)
Per provider, pt LWBS.

## 2022-04-28 NOTE — ED Triage Notes (Signed)
Pt is here for nausea, vomiting , diarrhea, scratchy throat, cough, bilateral ear pain, wheezing and pain behind the eyes , loss of appetite, fatigue and fever  x 4days . Pt took tylenol at 8am today

## 2022-05-09 ENCOUNTER — Telehealth: Payer: Medicaid Other | Admitting: Family Medicine

## 2022-05-09 DIAGNOSIS — R109 Unspecified abdominal pain: Secondary | ICD-10-CM

## 2022-05-09 DIAGNOSIS — R197 Diarrhea, unspecified: Secondary | ICD-10-CM

## 2022-05-09 DIAGNOSIS — R1110 Vomiting, unspecified: Secondary | ICD-10-CM

## 2022-05-09 NOTE — Progress Notes (Signed)
Because belly pain, fever, and both vomiting and diarrhea, I feel your condition warrants further evaluation and I recommend that you be seen in a face to face visit  [place]

## 2022-06-14 ENCOUNTER — Ambulatory Visit: Payer: Medicaid Other | Admitting: Nurse Practitioner

## 2023-09-02 ENCOUNTER — Ambulatory Visit: Payer: MEDICAID | Admitting: Allergy and Immunology

## 2023-11-13 ENCOUNTER — Ambulatory Visit: Payer: MEDICAID | Admitting: Allergy and Immunology

## 2023-12-26 ENCOUNTER — Ambulatory Visit: Payer: MEDICAID | Admitting: Allergy and Immunology

## 2024-02-20 ENCOUNTER — Ambulatory Visit: Payer: MEDICAID | Admitting: Allergy and Immunology
